# Patient Record
Sex: Female | Born: 1950 | Race: White | Hispanic: No | Marital: Married | Smoking: Former smoker
Health system: Southern US, Community
[De-identification: ages and names within clinical notes are randomized; demographics above are authoritative.]

## PROBLEM LIST (undated history)

## (undated) DIAGNOSIS — I499 Cardiac arrhythmia, unspecified: Secondary | ICD-10-CM

## (undated) DIAGNOSIS — J45909 Unspecified asthma, uncomplicated: Secondary | ICD-10-CM

## (undated) DIAGNOSIS — J189 Pneumonia, unspecified organism: Secondary | ICD-10-CM

## (undated) DIAGNOSIS — K219 Gastro-esophageal reflux disease without esophagitis: Secondary | ICD-10-CM

## (undated) DIAGNOSIS — E039 Hypothyroidism, unspecified: Secondary | ICD-10-CM

## (undated) DIAGNOSIS — IMO0002 Reserved for concepts with insufficient information to code with codable children: Secondary | ICD-10-CM

## (undated) DIAGNOSIS — M1990 Unspecified osteoarthritis, unspecified site: Secondary | ICD-10-CM

## (undated) DIAGNOSIS — I10 Essential (primary) hypertension: Secondary | ICD-10-CM

## (undated) HISTORY — PX: APPENDECTOMY: SHX54

## (undated) HISTORY — PX: TONSILLECTOMY: SUR1361

---

## 2004-11-24 ENCOUNTER — Ambulatory Visit (HOSPITAL_COMMUNITY): Admission: RE | Admit: 2004-11-24 | Discharge: 2004-11-24 | Payer: Self-pay | Admitting: Family Medicine

## 2005-01-24 ENCOUNTER — Ambulatory Visit (HOSPITAL_COMMUNITY): Admission: RE | Admit: 2005-01-24 | Discharge: 2005-01-24 | Payer: Self-pay | Admitting: Internal Medicine

## 2005-01-24 ENCOUNTER — Ambulatory Visit: Payer: Self-pay | Admitting: Internal Medicine

## 2006-10-26 ENCOUNTER — Ambulatory Visit (HOSPITAL_COMMUNITY): Admission: RE | Admit: 2006-10-26 | Discharge: 2006-10-26 | Payer: Self-pay | Admitting: Family Medicine

## 2006-10-29 ENCOUNTER — Ambulatory Visit (HOSPITAL_COMMUNITY): Admission: RE | Admit: 2006-10-29 | Discharge: 2006-10-29 | Payer: Self-pay | Admitting: Family Medicine

## 2008-03-25 ENCOUNTER — Ambulatory Visit (HOSPITAL_COMMUNITY): Admission: RE | Admit: 2008-03-25 | Discharge: 2008-03-25 | Payer: Self-pay | Admitting: Family Medicine

## 2010-08-09 ENCOUNTER — Ambulatory Visit (HOSPITAL_COMMUNITY)
Admission: RE | Admit: 2010-08-09 | Discharge: 2010-08-09 | Payer: Self-pay | Source: Home / Self Care | Admitting: Family Medicine

## 2010-09-21 ENCOUNTER — Ambulatory Visit (HOSPITAL_COMMUNITY): Admission: RE | Admit: 2010-09-21 | Payer: Self-pay | Admitting: Internal Medicine

## 2010-11-13 ENCOUNTER — Encounter: Payer: Self-pay | Admitting: Family Medicine

## 2010-11-14 ENCOUNTER — Encounter: Payer: Self-pay | Admitting: Family Medicine

## 2010-11-23 ENCOUNTER — Ambulatory Visit: Admit: 2010-11-23 | Payer: Self-pay | Admitting: Internal Medicine

## 2010-11-23 ENCOUNTER — Encounter (INDEPENDENT_AMBULATORY_CARE_PROVIDER_SITE_OTHER): Payer: Self-pay | Admitting: Internal Medicine

## 2010-11-23 ENCOUNTER — Ambulatory Visit (HOSPITAL_COMMUNITY): Admission: RE | Admit: 2010-11-23 | Payer: Self-pay | Source: Home / Self Care | Admitting: Internal Medicine

## 2011-03-10 NOTE — Op Note (Signed)
[name], [name]                 ACCOUNT NO.:  [id number]   MEDICAL RECORD NO.:  [id number]          PATIENT TYPE:  AMB   LOCATION:  DAY                           FACILITY:  [facility]   PHYSICIAN:  Lionel December, M.D.    DATE OF BIRTH:  August 15, 1951   DATE OF PROCEDURE:  01/24/2005  DATE OF DISCHARGE:                                 OPERATIVE REPORT   PROCEDURE:  Colonoscopy.   INDICATIONS:  Ashlee Solomon is a 61 year old Caucasian female who is here for  screening colonoscopy. Family history is positive for colon carcinoma and  father who died at age 50. Procedure risks were reviewed with the patient,  and informed consent was obtained.   PREMEDICATION:  Demerol 25 mg IV, Versed 10 mg IV in divided dose.   FINDINGS:  Procedure performed in endoscopy suite. The patient's vital signs  and O2 saturation were monitored during procedure and remained stable. The  patient was placed in left lateral position and rectal examination  performed. No abnormality noted on external or digital exam. Olympus  videoscope was placed in the rectum and advanced under vision into sigmoid  colon and beyond. Preparation was satisfactory. Somewhat redundant colon,  but scope was passed cecum which was identified by appendiceal stump/orifice  and ileocecal valve. Pictures taken for the record. She had some stool in  this area which had to be washed away. As the scope was withdrawn, colonic  mucosa was carefully examined was normal throughout. Rectal mucosa similarly  was normal. Scope was retroflexed to examine anorectal junction which was  unremarkable. Endoscope was straightened and withdrawn. The patient  tolerated the procedure well.   FINAL DIAGNOSIS:  Normal examination.   RECOMMENDATIONS:  She should continue yearly Hemoccults and consider next  screening exam in 5 years from now.     NR/MEDQ  D:  01/24/2005  T:  01/24/2005  Job:  [id number]   cc:   Donna Bernard, M.D.  [address]  [place]  [place] [address]  Fax: [phone]

## 2011-10-24 DIAGNOSIS — J189 Pneumonia, unspecified organism: Secondary | ICD-10-CM

## 2011-10-24 HISTORY — DX: Pneumonia, unspecified organism: J18.9

## 2012-02-23 ENCOUNTER — Other Ambulatory Visit: Payer: Self-pay | Admitting: Orthopedic Surgery

## 2012-02-23 MED ORDER — BUPIVACAINE 0.25 % ON-Q PUMP SINGLE CATH 300ML: 300.0000 mL | INJECTION | Status: DC

## 2012-02-23 MED ORDER — DEXAMETHASONE SODIUM PHOSPHATE 10 MG/ML IJ SOLN: 10.0000 mg | Freq: Once | INTRAMUSCULAR | Status: DC

## 2012-03-08 ENCOUNTER — Other Ambulatory Visit: Payer: Self-pay | Admitting: Family Medicine

## 2012-03-08 DIAGNOSIS — Z139 Encounter for screening, unspecified: Secondary | ICD-10-CM

## 2012-03-19 ENCOUNTER — Ambulatory Visit (HOSPITAL_COMMUNITY)
Admission: RE | Admit: 2012-03-19 | Discharge: 2012-03-19 | Disposition: A | Discharge status: Home / Self Care | Payer: BC Managed Care – PPO | Source: Ambulatory Visit | Attending: Family Medicine | Admitting: Family Medicine

## 2012-03-19 DIAGNOSIS — Z1231 Encounter for screening mammogram for malignant neoplasm of breast: Secondary | ICD-10-CM | POA: Insufficient documentation

## 2012-03-19 DIAGNOSIS — Z139 Encounter for screening, unspecified: Secondary | ICD-10-CM

## 2012-05-22 ENCOUNTER — Encounter (HOSPITAL_COMMUNITY): Payer: Self-pay | Admitting: Pharmacy Technician

## 2012-05-24 ENCOUNTER — Encounter (HOSPITAL_COMMUNITY)
Admission: RE | Admit: 2012-05-24 | Discharge: 2012-05-24 | Disposition: A | Discharge status: Home / Self Care | Payer: BC Managed Care – PPO | Source: Ambulatory Visit | Attending: Orthopedic Surgery | Admitting: Orthopedic Surgery

## 2012-05-24 ENCOUNTER — Ambulatory Visit (HOSPITAL_COMMUNITY)
Admission: RE | Admit: 2012-05-24 | Discharge: 2012-05-24 | Disposition: A | Discharge status: Home / Self Care | Payer: BC Managed Care – PPO | Source: Ambulatory Visit | Attending: Orthopedic Surgery | Admitting: Orthopedic Surgery

## 2012-05-24 ENCOUNTER — Encounter (HOSPITAL_COMMUNITY): Payer: Self-pay

## 2012-05-24 DIAGNOSIS — Z01818 Encounter for other preprocedural examination: Secondary | ICD-10-CM | POA: Insufficient documentation

## 2012-05-24 DIAGNOSIS — Z01812 Encounter for preprocedural laboratory examination: Secondary | ICD-10-CM | POA: Insufficient documentation

## 2012-05-24 DIAGNOSIS — Z01810 Encounter for preprocedural cardiovascular examination: Secondary | ICD-10-CM | POA: Insufficient documentation

## 2012-05-24 DIAGNOSIS — J9819 Other pulmonary collapse: Secondary | ICD-10-CM | POA: Insufficient documentation

## 2012-05-24 HISTORY — DX: Pneumonia, unspecified organism: J18.9

## 2012-05-24 HISTORY — DX: Unspecified osteoarthritis, unspecified site: M19.90

## 2012-05-24 HISTORY — DX: Unspecified asthma, uncomplicated: J45.909

## 2012-05-24 HISTORY — DX: Reserved for concepts with insufficient information to code with codable children: IMO0002

## 2012-05-24 HISTORY — DX: Essential (primary) hypertension: I10

## 2012-05-24 HISTORY — DX: Gastro-esophageal reflux disease without esophagitis: K21.9

## 2012-05-24 HISTORY — DX: Hypothyroidism, unspecified: E03.9

## 2012-05-24 LAB — URINALYSIS, ROUTINE W REFLEX MICROSCOPIC
Glucose, UA: NEGATIVE mg/dL
Hgb urine dipstick: NEGATIVE
Ketones, ur: NEGATIVE mg/dL
Leukocytes, UA: NEGATIVE
Nitrite: NEGATIVE
Protein, ur: NEGATIVE mg/dL
Specific Gravity, Urine: 1.012 (ref 1.005–1.030)
pH: 6.5 (ref 5.0–8.0)

## 2012-05-24 LAB — CBC
HCT: 37.4 % (ref 36.0–46.0)
MCV: 83.5 fL (ref 78.0–100.0)
Platelets: 268 10*3/uL (ref 150–400)
RBC: 4.48 MIL/uL (ref 3.87–5.11)
WBC: 9.8 10*3/uL (ref 4.0–10.5)

## 2012-05-24 LAB — COMPREHENSIVE METABOLIC PANEL
AST: 20 U/L (ref 0–37)
Alkaline Phosphatase: 74 U/L (ref 39–117)
CO2: 29 mEq/L (ref 19–32)
Chloride: 100 mEq/L (ref 96–112)
Creatinine, Ser: 0.76 mg/dL (ref 0.50–1.10)
GFR calc non Af Amer: 89 mL/min — ABNORMAL LOW (ref >90)
Potassium: 3.6 mEq/L (ref 3.5–5.1)
Total Bilirubin: 0.3 mg/dL (ref 0.3–1.2)

## 2012-05-24 LAB — SURGICAL PCR SCREEN: Staphylococcus aureus: NEGATIVE

## 2012-05-24 LAB — PROTIME-INR: Prothrombin Time: 13.2 seconds (ref 11.6–15.2)

## 2012-05-24 NOTE — Patient Instructions (Signed)
20 CLARINE ELROD  05/24/2012   Your procedure is scheduled on:  06/03/12  Monday  Surgery [id number]  Report to [facility] at   [phone]    AM.  Call this number if you have problems the morning of surgery: [phone]     Or PST   [id number]  [facility]   Remember:   Do not eat food  Or drink :After Midnight. Sunday NIGHT    Take these medicines the morning of surgery with A SIP OF WATER: METOPROLOL, CLARITIN, LEVOTHYROXINE                                                  MAY USE ALBUTEROL IF NEEDED         BRING INHALER WITH YOU TO HOSPITAL   Do not wear jewelry, make-up or nail polish.  Do not wear lotions, powders, or perfumes. You may wear deodorant.  Do not shave 48 hours prior to surgery.  Do not bring valuables to the hospital.  Contacts, dentures or bridgework may not be worn into surgery.  Leave suitcase in the car. After surgery it may be brought to your room.  For patients admitted to the hospital, checkout time is 11:00 AM the day of discharge.   Patients discharged the day of surgery will not be allowed to drive home.  Name and phone number of your driver:    husband                                                                  Special Instructions: CHG Shower Use Special Wash: 1/2 bottle night before surgery and 1/2 bottle morning of surgery. REGULAR SOAP FACE AND PRIVATES              LADIES- NO SHAVING 48 HOURS BEFORE USING BETASEPT SOAP.                  Please read over the following fact sheets that you were given: MRSA Information

## 2012-05-24 NOTE — Pre-Procedure Instructions (Signed)
Faxed request to Dr Lequita Halt with confirmation for PRE OP ORDERS pre PST appt

## 2012-05-27 NOTE — Pre-Procedure Instructions (Signed)
Second request for orders requested with confirmation Dr Lequita Halt

## 2012-05-29 ENCOUNTER — Other Ambulatory Visit: Payer: Self-pay | Admitting: Orthopedic Surgery

## 2012-05-29 MED ORDER — BUPIVACAINE 0.25 % ON-Q PUMP SINGLE CATH 300ML: 300.0000 mL | INJECTION | Status: DC

## 2012-05-29 MED ORDER — DEXAMETHASONE SODIUM PHOSPHATE 10 MG/ML IJ SOLN: 10.0000 mg | Freq: Once | INTRAMUSCULAR | Status: DC

## 2012-05-29 NOTE — Progress Notes (Signed)
Preoperative surgical orders have been place into the Epic hospital system for Ashlee Solomon on 05/29/2012, 4:03 PM  by [name] for surgery on 06/03/12.  Preop Total Knee orders including Bupivacaine On-Q pump, IV Tylenol, and IV Decadron as long as there are no contraindications to the above medications. Avel Peace, PA-C

## 2012-06-02 ENCOUNTER — Other Ambulatory Visit: Payer: Self-pay | Admitting: Orthopedic Surgery

## 2012-06-02 NOTE — H&P (Signed)
Ashlee Solomon Heads  DOB: 12-31-1950 Married / Language: English / Race: White / Female  Date of Admission: 06/03/2012  Chief complaint:  Left Knee Pain  History of Present Illness The patient is a 61 year old female who comes in for a preoperative History and Physical. The patient is scheduled for a total knee arthroplasty to be performed by Dr. Gus Rankin. Aluisio, MD at [facility] on 06/03/2012. The patient is a 61 year old female who presents for a recheck of Follow-up Knee. The patient is being followed for their bilateral knee pain and osteoarthritis. Symptoms reported today include: pain, swelling and pain with weightbearing. The patient feels that they are doing poorly. Current treatment includes: NSAIDs (Celebrex). The following medication has been used for pain control: none. The patient has reported improvement of their symptoms with: Cortisone injections (last injections only helped for about a month). She states that the knees are getting progressively worse. The Cortisone is no longer beneficial. She said that the left knee is causing more problems and making it more difficult for her to get around. She had not had a viscosupplement response in the past. She is at a stage now where she feels like she needs a more permanent solution to the knee pain. The right knee hurts also but less than the left. It does hurt at night. It is limiting what she can and cannot do. She is ready to proceed with surgery for the left knee. They have been treated conservatively in the past for the above stated problem and despite conservative measures, they continue to have progressive pain and severe functional limitations and dysfunction. They have failed non-operative management including home exercise, medications, and injections. It is felt that they would benefit from undergoing total joint replacement. Risks and benefits of the procedure have been discussed with the patient and they elect  to proceed with surgery. There are no active contraindications to surgery such as ongoing infection or rapidly progressive neurological disease.   Problem List/Past Medical Osteoarthritis, Knee (715.96) Asthma High blood pressure Hypothyroidism Degenerative Disc Disease  Allergies No Known Drug Allergies   Family History Bleeding disorder. mother Cancer. mother, father, sister, brother and grandmother mothers side Cerebrovascular Accident. grandfather fathers side Drug / Alcohol Addiction. grandfather mothers side Osteoarthritis. sister, brother and grandmother fathers side Osteoporosis. sister Heart Disease. father Rheumatoid Arthritis. father   Social History Alcohol use. current drinker; drinks beer, wine and hard liquor; less than 5 per week Previously in rehab. no Pain Contract. no Number of flights of stairs before winded. 1 Tobacco / smoke exposure. no Tobacco use. 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 3 Exercise. Exercises rarely Marital status. married Living situation. live with spouse Illicit drug use. no   Medication History Synthroid ( Tablet, Oral) Active. Metoprolol Tartrate (50MG  Tablet, Oral) Active. CeleBREX (200MG  Capsule, 1 (one) Oral daily, Taken starting 02/08/2012) Active. Hydrochlorothiazide (25MG  Tablet, Oral) Active. Aspirin (81MG  Tablet, Oral) Active.   Past Surgical History Appendectomy Cesarean Delivery. 3 or more times Tonsillectomy   Review of Systems General:Not Present- Chills, Fever, Night Sweats, Fatigue, Weight Gain, Weight Loss and Memory Loss. Skin:Not Present- Hives, Itching, Rash, Eczema and Lesions. HEENT:Not Present- Tinnitus, Headache, Double Vision, Visual Loss, Hearing Loss and Dentures. Respiratory:Not Present- Shortness of breath with  exertion, Shortness of breath at rest, Allergies, Coughing up blood and Chronic Cough. Cardiovascular:Not Present- Chest Pain,  Racing/skipping heartbeats, Difficulty Breathing Lying Down, Murmur, Swelling and Palpitations. Gastrointestinal:Not Present- Bloody Stool, Heartburn, Abdominal Pain, Vomiting, Nausea, Constipation, Diarrhea, Difficulty Swallowing, Jaundice and Loss of appetitie. Female Genitourinary:Not Present- Blood in Urine, Urinary frequency, Weak urinary stream, Discharge, Flank Pain, Incontinence, Painful Urination, Urgency, Urinary Retention and Urinating at Night. Musculoskeletal:Present- Joint Swelling, Joint Pain, Back Pain and Morning Stiffness. Not Present- Muscle Weakness, Muscle Pain and Spasms. Neurological:Not Present- Tremor, Dizziness, Blackout spells, Paralysis, Difficulty with balance and Weakness. Psychiatric:Not Present- Insomnia.   Vitals Weight: 290 lb Height: 64 in Body Surface Area: 2.44 m Body Mass Index: 49.78 kg/m Pulse: 68 (Regular) Resp.: 12 (Unlabored) BP: 128/82 (Sitting, Left Arm, Standard)   Physical Exam The physical exam findings are as follows: Patient is a 62 year old female with continued knee pain.   General Mental Status - Alert, cooperative and good historian. General Appearance- pleasant. Not in acute distress. Orientation- Oriented X3. Build & Nutrition- Well nourished and Well developed.   Head and Neck Head- normocephalic, atraumatic . Neck Global Assessment- supple. no bruit auscultated on the right and no bruit auscultated on the left.   Eye Pupil- Bilateral- Regular and Round. Motion- Bilateral- EOMI. wears glasses  Chest and Lung Exam Auscultation: Breath sounds:- clear at anterior chest wall and - clear at posterior chest wall. Adventitious sounds:- No Adventitious sounds.   Cardiovascular Auscultation:Rhythm- Regular rate and rhythm. Heart Sounds- S1 WNL and S2  WNL. Murmurs & Other Heart Sounds:Auscultation of the heart reveals - No Murmurs.   Abdomen Inspection:Contour- Generalized moderate distention. Palpation/Percussion:Tenderness- Abdomen is non-tender to palpation. Rigidity (guarding)- Abdomen is soft. Auscultation:Auscultation of the abdomen reveals - Bowel sounds normal.   Female Genitourinary Not done, not pertinent to present illness  Musculoskeletal On exam, well developed female alert and oriented in no apparent distress. Her hips show normal range of motion with no discomfort. Left knee no effusion. Range is 5-115. Marked crepitus on range of motion. She is tender medial greater than lateral. There is no instability noted. Right knee about 5-130. Slight crepitus on range of motion. Tender medial greater than lateral with no instability. Pulse, sensation and motor intact both lower extremities.  RADIOGRAPHS: Radiographs reviewed. Once again shows bone on bone arthritis in the medial and patellofemoral compartments both knees with osteophyte formation.  Assessment & Plan Osteoarthritis, Knee (715.96) Impression: Left Knee  Note: Patient is for a Left Total Knee Replacement by Dr. Lequita Halt.  Plan is to go home but will look into rehab if needed.  PCP - Dr. Lubertha South  Signed electronically by Roberts Gaudy, PA-C

## 2012-06-02 NOTE — Anesthesia Preprocedure Evaluation (Addendum)
Anesthesia Evaluation  Patient identified by MRN, date of birth, ID band Patient awake    Reviewed: Allergy & Precautions, H&P , NPO status , Patient's Chart, lab work & pertinent test results, reviewed documented beta blocker date and time   Airway Mallampati: II TM Distance: >3 FB Neck ROM: full    Dental No notable dental hx. (+) Teeth Intact and Dental Advisory Given   Pulmonary neg pulmonary ROS, asthma ,  breath sounds clear to auscultation  Pulmonary exam normal       Cardiovascular Exercise Tolerance: Good hypertension, Pt. on medications and Pt. on home beta blockers negative cardio ROS  Rhythm:regular Rate:Normal     Neuro/Psych negative neurological ROS  negative psych ROS   GI/Hepatic negative GI ROS, Neg liver ROS, GERD-  Medicated and Controlled,  Endo/Other  negative endocrine ROSHypothyroidism   Renal/GU negative Renal ROS  negative genitourinary   Musculoskeletal   Abdominal   Peds  Hematology negative hematology ROS (+)   Anesthesia Other Findings   Reproductive/Obstetrics negative OB ROS                           Anesthesia Physical Anesthesia Plan  ASA: II  Anesthesia Plan: Spinal   Post-op Pain Management:    Induction:   Airway Management Planned:   Additional Equipment:   Intra-op Plan:   Post-operative Plan:   Informed Consent: I have reviewed the patients History and Physical, chart, labs and discussed the procedure including the risks, benefits and alternatives for the proposed anesthesia with the patient or authorized representative who has indicated his/her understanding and acceptance.   Dental Advisory Given  Plan Discussed with: CRNA and Surgeon  Anesthesia Plan Comments:         Anesthesia Quick Evaluation

## 2012-06-03 ENCOUNTER — Ambulatory Visit (HOSPITAL_COMMUNITY): Payer: BC Managed Care – PPO | Admitting: Anesthesiology

## 2012-06-03 ENCOUNTER — Encounter (HOSPITAL_COMMUNITY)
Admission: RE | Disposition: A | Discharge status: Home Health | Payer: Self-pay | Source: Ambulatory Visit | Attending: Orthopedic Surgery

## 2012-06-03 ENCOUNTER — Encounter (HOSPITAL_COMMUNITY): Payer: Self-pay | Admitting: *Deleted

## 2012-06-03 ENCOUNTER — Inpatient Hospital Stay (HOSPITAL_COMMUNITY)
Admission: RE | Admit: 2012-06-03 | Discharge: 2012-06-06 | DRG: 209 | Disposition: A | Discharge status: Home Health | Payer: BC Managed Care – PPO | Source: Ambulatory Visit | Attending: Orthopedic Surgery | Admitting: Orthopedic Surgery

## 2012-06-03 ENCOUNTER — Encounter (HOSPITAL_COMMUNITY): Payer: Self-pay | Admitting: Anesthesiology

## 2012-06-03 DIAGNOSIS — IMO0002 Reserved for concepts with insufficient information to code with codable children: Secondary | ICD-10-CM | POA: Diagnosis present

## 2012-06-03 DIAGNOSIS — E871 Hypo-osmolality and hyponatremia: Secondary | ICD-10-CM

## 2012-06-03 DIAGNOSIS — J45909 Unspecified asthma, uncomplicated: Secondary | ICD-10-CM | POA: Diagnosis present

## 2012-06-03 DIAGNOSIS — D500 Iron deficiency anemia secondary to blood loss (chronic): Secondary | ICD-10-CM

## 2012-06-03 DIAGNOSIS — Z96659 Presence of unspecified artificial knee joint: Secondary | ICD-10-CM

## 2012-06-03 DIAGNOSIS — M1710 Unilateral primary osteoarthritis, unspecified knee: Principal | ICD-10-CM | POA: Diagnosis present

## 2012-06-03 DIAGNOSIS — D649 Anemia, unspecified: Secondary | ICD-10-CM | POA: Diagnosis not present

## 2012-06-03 DIAGNOSIS — M179 Osteoarthritis of knee, unspecified: Secondary | ICD-10-CM

## 2012-06-03 DIAGNOSIS — E039 Hypothyroidism, unspecified: Secondary | ICD-10-CM | POA: Diagnosis present

## 2012-06-03 DIAGNOSIS — K219 Gastro-esophageal reflux disease without esophagitis: Secondary | ICD-10-CM | POA: Diagnosis present

## 2012-06-03 DIAGNOSIS — I10 Essential (primary) hypertension: Secondary | ICD-10-CM | POA: Diagnosis present

## 2012-06-03 DIAGNOSIS — E876 Hypokalemia: Secondary | ICD-10-CM

## 2012-06-03 HISTORY — PX: TOTAL KNEE ARTHROPLASTY: SHX125

## 2012-06-03 LAB — TYPE AND SCREEN: ABO/RH(D): O NEG

## 2012-06-03 LAB — ABO/RH: ABO/RH(D): O NEG

## 2012-06-03 SURGERY — ARTHROPLASTY, KNEE, TOTAL
Anesthesia: Spinal | Site: Knee | Laterality: Left | Wound class: Clean

## 2012-06-03 MED ORDER — METOCLOPRAMIDE HCL 10 MG PO TABS
5.0000 mg | ORAL_TABLET | Freq: Three times a day (TID) | ORAL | Status: DC | PRN
Start: 2012-06-03 — End: 2012-06-06

## 2012-06-03 MED ORDER — STERILE WATER FOR IRRIGATION IR SOLN
Status: DC | PRN
  Administered 2012-06-03: 1500 mL

## 2012-06-03 MED ORDER — LACTATED RINGERS IV SOLN
INTRAVENOUS | Status: DC | PRN
  Administered 2012-06-03 (×3): via INTRAVENOUS

## 2012-06-03 MED ORDER — ACETAMINOPHEN 650 MG RE SUPP: 650.0000 mg | Freq: Four times a day (QID) | RECTAL | Status: DC | PRN

## 2012-06-03 MED ORDER — POLYETHYLENE GLYCOL 3350 17 G PO PACK: 17.0000 g | PACK | Freq: Every day | ORAL | Status: DC | PRN

## 2012-06-03 MED ORDER — MORPHINE SULFATE (PF) 1 MG/ML IV SOLN
INTRAVENOUS | Status: DC
  Administered 2012-06-03: 5 mg via INTRAVENOUS
  Administered 2012-06-03: 2 mg via INTRAVENOUS
  Administered 2012-06-03: 11:00:00 via INTRAVENOUS
  Administered 2012-06-03: 3 mg via INTRAVENOUS
  Administered 2012-06-04: 10 mg via INTRAVENOUS
  Administered 2012-06-04: 2 mg via INTRAVENOUS
  Administered 2012-06-04: 09:00:00 via INTRAVENOUS
  Filled 2012-06-03: qty 25

## 2012-06-03 MED ORDER — TRAMADOL HCL 50 MG PO TABS: 50.0000 mg | ORAL_TABLET | Freq: Four times a day (QID) | ORAL | Status: DC | PRN

## 2012-06-03 MED ORDER — KCL IN DEXTROSE-NACL 20-5-0.9 MEQ/L-%-% IV SOLN
INTRAVENOUS | Status: DC
  Administered 2012-06-03 – 2012-06-04 (×2): via INTRAVENOUS
  Filled 2012-06-03 (×3): qty 1000

## 2012-06-03 MED ORDER — CEFAZOLIN SODIUM-DEXTROSE 2-3 GM-% IV SOLR
INTRAVENOUS | Status: AC
  Filled 2012-06-03: qty 50

## 2012-06-03 MED ORDER — DOCUSATE SODIUM 100 MG PO CAPS
100.0000 mg | ORAL_CAPSULE | Freq: Two times a day (BID) | ORAL | Status: DC
  Administered 2012-06-03 – 2012-06-06 (×7): 100 mg via ORAL

## 2012-06-03 MED ORDER — SODIUM CHLORIDE 0.9 % IR SOLN
Status: DC | PRN
  Administered 2012-06-03: 3000 mL

## 2012-06-03 MED ORDER — MIDAZOLAM HCL 5 MG/5ML IJ SOLN
INTRAMUSCULAR | Status: DC | PRN
  Administered 2012-06-03 (×3): 1 mg via INTRAVENOUS
  Administered 2012-06-03: 2 mg via INTRAVENOUS

## 2012-06-03 MED ORDER — ACETAMINOPHEN 325 MG PO TABS
650.0000 mg | ORAL_TABLET | Freq: Four times a day (QID) | ORAL | Status: DC | PRN
  Administered 2012-06-04: 650 mg via ORAL
  Filled 2012-06-03: qty 2

## 2012-06-03 MED ORDER — METOPROLOL SUCCINATE ER 50 MG PO TB24
50.0000 mg | ORAL_TABLET | Freq: Every day | ORAL | Status: DC
  Administered 2012-06-04 – 2012-06-06 (×3): 50 mg via ORAL
  Filled 2012-06-03 (×5): qty 1

## 2012-06-03 MED ORDER — FLEET ENEMA 7-19 GM/118ML RE ENEM: 1.0000 | ENEMA | Freq: Once | RECTAL | Status: AC | PRN

## 2012-06-03 MED ORDER — METHOCARBAMOL 100 MG/ML IJ SOLN
500.0000 mg | Freq: Four times a day (QID) | INTRAVENOUS | Status: DC | PRN
  Administered 2012-06-03 (×2): 500 mg via INTRAVENOUS
  Filled 2012-06-03 (×2): qty 5

## 2012-06-03 MED ORDER — CEFAZOLIN SODIUM 1-5 GM-% IV SOLN
1.0000 g | Freq: Four times a day (QID) | INTRAVENOUS | Status: AC
  Administered 2012-06-03 (×2): 1 g via INTRAVENOUS
  Filled 2012-06-03 (×2): qty 50

## 2012-06-03 MED ORDER — NALOXONE HCL 0.4 MG/ML IJ SOLN: 0.4000 mg | INTRAMUSCULAR | Status: DC | PRN

## 2012-06-03 MED ORDER — DIPHENHYDRAMINE HCL 12.5 MG/5ML PO ELIX
12.5000 mg | ORAL_SOLUTION | ORAL | Status: DC | PRN
Start: 2012-06-03 — End: 2012-06-06

## 2012-06-03 MED ORDER — BUPIVACAINE 0.25 % ON-Q PUMP SINGLE CATH 300ML
INJECTION | Status: AC
Start: 2012-06-03 — End: ?
  Filled 2012-06-03: qty 300

## 2012-06-03 MED ORDER — ACETAMINOPHEN 10 MG/ML IV SOLN
1000.0000 mg | Freq: Four times a day (QID) | INTRAVENOUS | Status: AC
  Administered 2012-06-03 – 2012-06-04 (×4): 1000 mg via INTRAVENOUS
  Filled 2012-06-03 (×6): qty 100

## 2012-06-03 MED ORDER — MORPHINE SULFATE (PF) 1 MG/ML IV SOLN
INTRAVENOUS | Status: AC
  Filled 2012-06-03: qty 25

## 2012-06-03 MED ORDER — BUPIVACAINE ON-Q PAIN PUMP (FOR ORDER SET NO CHG)
INJECTION | Status: DC
  Filled 2012-06-03: qty 1

## 2012-06-03 MED ORDER — FENTANYL CITRATE 0.05 MG/ML IJ SOLN
INTRAMUSCULAR | Status: DC | PRN
  Administered 2012-06-03: 100 ug via INTRAVENOUS

## 2012-06-03 MED ORDER — ACETAMINOPHEN 10 MG/ML IV SOLN
INTRAVENOUS | Status: AC
  Filled 2012-06-03: qty 100

## 2012-06-03 MED ORDER — RIVAROXABAN 10 MG PO TABS
10.0000 mg | ORAL_TABLET | Freq: Every day | ORAL | Status: DC
  Administered 2012-06-04 – 2012-06-06 (×3): 10 mg via ORAL
  Filled 2012-06-03 (×5): qty 1

## 2012-06-03 MED ORDER — CEFAZOLIN SODIUM 1-5 GM-% IV SOLN
INTRAVENOUS | Status: AC
  Filled 2012-06-03: qty 50

## 2012-06-03 MED ORDER — DIPHENHYDRAMINE HCL 50 MG/ML IJ SOLN: 12.5000 mg | Freq: Four times a day (QID) | INTRAMUSCULAR | Status: DC | PRN

## 2012-06-03 MED ORDER — PROPOFOL 10 MG/ML IV BOLUS
INTRAVENOUS | Status: DC | PRN
  Administered 2012-06-03: 20 mg via INTRAVENOUS

## 2012-06-03 MED ORDER — BUPIVACAINE HCL 0.75 % IJ SOLN
INTRAMUSCULAR | Status: DC | PRN
  Administered 2012-06-03: 15 mg via INTRATHECAL

## 2012-06-03 MED ORDER — SODIUM CHLORIDE 0.9 % IJ SOLN: 9.0000 mL | INTRAMUSCULAR | Status: DC | PRN

## 2012-06-03 MED ORDER — DEXAMETHASONE SODIUM PHOSPHATE 10 MG/ML IJ SOLN
INTRAMUSCULAR | Status: DC | PRN
  Administered 2012-06-03: 10 mg via INTRAVENOUS

## 2012-06-03 MED ORDER — DIPHENHYDRAMINE HCL 12.5 MG/5ML PO ELIX: 12.5000 mg | ORAL_SOLUTION | Freq: Four times a day (QID) | ORAL | Status: DC | PRN

## 2012-06-03 MED ORDER — LORATADINE 10 MG PO TABS
10.0000 mg | ORAL_TABLET | Freq: Every day | ORAL | Status: DC
  Administered 2012-06-04 – 2012-06-06 (×3): 10 mg via ORAL
  Filled 2012-06-03 (×4): qty 1

## 2012-06-03 MED ORDER — SODIUM CHLORIDE 0.9 % IV SOLN: INTRAVENOUS | Status: DC

## 2012-06-03 MED ORDER — LACTATED RINGERS IV SOLN: INTRAVENOUS | Status: DC

## 2012-06-03 MED ORDER — HYDROMORPHONE HCL PF 1 MG/ML IJ SOLN: 0.2500 mg | INTRAMUSCULAR | Status: DC | PRN

## 2012-06-03 MED ORDER — METOCLOPRAMIDE HCL 5 MG/ML IJ SOLN: 5.0000 mg | Freq: Three times a day (TID) | INTRAMUSCULAR | Status: DC | PRN

## 2012-06-03 MED ORDER — ACETAMINOPHEN 10 MG/ML IV SOLN
1000.0000 mg | Freq: Once | INTRAVENOUS | Status: AC
  Administered 2012-06-03: 1000 mg via INTRAVENOUS

## 2012-06-03 MED ORDER — PROPOFOL 10 MG/ML IV EMUL
INTRAVENOUS | Status: DC | PRN
  Administered 2012-06-03: 100 ug/kg/min via INTRAVENOUS

## 2012-06-03 MED ORDER — ONDANSETRON HCL 4 MG/2ML IJ SOLN: 4.0000 mg | Freq: Four times a day (QID) | INTRAMUSCULAR | Status: DC | PRN

## 2012-06-03 MED ORDER — LEVOTHYROXINE SODIUM 175 MCG PO TABS
175.0000 ug | ORAL_TABLET | Freq: Every day | ORAL | Status: DC
  Administered 2012-06-04 – 2012-06-06 (×3): 175 ug via ORAL
  Filled 2012-06-03 (×5): qty 1

## 2012-06-03 MED ORDER — HYDROCHLOROTHIAZIDE 25 MG PO TABS
25.0000 mg | ORAL_TABLET | Freq: Every day | ORAL | Status: DC
  Administered 2012-06-04 – 2012-06-06 (×3): 25 mg via ORAL
  Filled 2012-06-03 (×5): qty 1

## 2012-06-03 MED ORDER — ONDANSETRON HCL 4 MG PO TABS: 4.0000 mg | ORAL_TABLET | Freq: Four times a day (QID) | ORAL | Status: DC | PRN

## 2012-06-03 MED ORDER — OXYCODONE HCL 5 MG PO TABS
5.0000 mg | ORAL_TABLET | ORAL | Status: DC | PRN
  Administered 2012-06-03: 10 mg via ORAL
  Administered 2012-06-03: 5 mg via ORAL
  Administered 2012-06-04 – 2012-06-06 (×15): 10 mg via ORAL
  Filled 2012-06-03 (×8): qty 2
  Filled 2012-06-03: qty 1
  Filled 2012-06-03 (×8): qty 2

## 2012-06-03 MED ORDER — KETAMINE HCL 10 MG/ML IJ SOLN
INTRAMUSCULAR | Status: DC | PRN
  Administered 2012-06-03 (×4): 10 mg via INTRAVENOUS

## 2012-06-03 MED ORDER — METHOCARBAMOL 500 MG PO TABS
500.0000 mg | ORAL_TABLET | Freq: Four times a day (QID) | ORAL | Status: DC | PRN
  Administered 2012-06-04 – 2012-06-06 (×8): 500 mg via ORAL
  Filled 2012-06-03 (×8): qty 1

## 2012-06-03 MED ORDER — EPHEDRINE SULFATE 50 MG/ML IJ SOLN
INTRAMUSCULAR | Status: DC | PRN
  Administered 2012-06-03 (×2): 5 mg via INTRAVENOUS
  Administered 2012-06-03: 10 mg via INTRAVENOUS

## 2012-06-03 MED ORDER — BUPIVACAINE 0.25 % ON-Q PUMP SINGLE CATH 300ML
INJECTION | Status: DC | PRN
  Administered 2012-06-03: 300 mL

## 2012-06-03 MED ORDER — ALBUTEROL SULFATE HFA 108 (90 BASE) MCG/ACT IN AERS
2.0000 | INHALATION_SPRAY | Freq: Four times a day (QID) | RESPIRATORY_TRACT | Status: DC | PRN
  Filled 2012-06-03: qty 6.7

## 2012-06-03 MED ORDER — DEXTROSE 5 % IV SOLN
3.0000 g | INTRAVENOUS | Status: AC
  Administered 2012-06-03: 3 g via INTRAVENOUS
  Filled 2012-06-03: qty 3000

## 2012-06-03 MED ORDER — BISACODYL 10 MG RE SUPP: 10.0000 mg | Freq: Every day | RECTAL | Status: DC | PRN

## 2012-06-03 MED ORDER — MENTHOL 3 MG MT LOZG
1.0000 | LOZENGE | OROMUCOSAL | Status: DC | PRN
  Filled 2012-06-03: qty 9

## 2012-06-03 MED ORDER — 0.9 % SODIUM CHLORIDE (POUR BTL) OPTIME
TOPICAL | Status: DC | PRN
  Administered 2012-06-03: 1000 mL

## 2012-06-03 MED ORDER — PHENOL 1.4 % MT LIQD
1.0000 | OROMUCOSAL | Status: DC | PRN
  Filled 2012-06-03: qty 177

## 2012-06-03 SURGICAL SUPPLY — 54 items
BAG ZIPLOCK 12X15 (MISCELLANEOUS) ×2 IMPLANT
BANDAGE ELASTIC 6 VELCRO ST LF (GAUZE/BANDAGES/DRESSINGS) ×2 IMPLANT
BANDAGE ESMARK 6X9 LF (GAUZE/BANDAGES/DRESSINGS) ×1 IMPLANT
BLADE SAG 18X100X1.27 (BLADE) ×2 IMPLANT
BLADE SAW SGTL 11.0X1.19X90.0M (BLADE) ×2 IMPLANT
BNDG ESMARK 6X9 LF (GAUZE/BANDAGES/DRESSINGS) ×2
BOWL SMART MIX CTS (DISPOSABLE) ×2 IMPLANT
CATH KIT ON-Q SILVERSOAK 5IN (CATHETERS) ×2 IMPLANT
CEMENT HV SMART SET (Cement) ×6 IMPLANT
CLOTH BEACON ORANGE TIMEOUT ST (SAFETY) ×2 IMPLANT
CUFF TOURN SGL QUICK 34 (TOURNIQUET CUFF) ×1
CUFF TRNQT CYL 34X4X40X1 (TOURNIQUET CUFF) ×1 IMPLANT
DRAPE EXTREMITY T 121X128X90 (DRAPE) ×2 IMPLANT
DRAPE POUCH INSTRU U-SHP 10X18 (DRAPES) ×2 IMPLANT
DRAPE U-SHAPE 47X51 STRL (DRAPES) ×2 IMPLANT
DRSG ADAPTIC 3X8 NADH LF (GAUZE/BANDAGES/DRESSINGS) ×2 IMPLANT
DRSG EMULSION OIL 3X16 NADH (GAUZE/BANDAGES/DRESSINGS) ×2 IMPLANT
DRSG PAD ABDOMINAL 8X10 ST (GAUZE/BANDAGES/DRESSINGS) ×2 IMPLANT
DURAPREP 26ML APPLICATOR (WOUND CARE) ×2 IMPLANT
ELECT REM PT RETURN 9FT ADLT (ELECTROSURGICAL) ×2
ELECTRODE REM PT RTRN 9FT ADLT (ELECTROSURGICAL) ×1 IMPLANT
EVACUATOR 1/8 PVC DRAIN (DRAIN) ×2 IMPLANT
FACESHIELD LNG OPTICON STERILE (SAFETY) ×10 IMPLANT
GLOVE BIO SURGEON STRL SZ7.5 (GLOVE) ×2 IMPLANT
GLOVE BIO SURGEON STRL SZ8 (GLOVE) ×2 IMPLANT
GLOVE BIOGEL PI IND STRL 8 (GLOVE) ×2 IMPLANT
GLOVE BIOGEL PI INDICATOR 8 (GLOVE) ×2
GOWN STRL NON-REIN LRG LVL3 (GOWN DISPOSABLE) ×2 IMPLANT
GOWN STRL REIN XL XLG (GOWN DISPOSABLE) ×2 IMPLANT
HANDPIECE INTERPULSE COAX TIP (DISPOSABLE) ×1
IMMOBILIZER KNEE 20 (SOFTGOODS) ×2
IMMOBILIZER KNEE 20 THIGH 36 (SOFTGOODS) ×1 IMPLANT
KIT BASIN OR (CUSTOM PROCEDURE TRAY) ×2 IMPLANT
MANIFOLD NEPTUNE II (INSTRUMENTS) ×2 IMPLANT
NS IRRIG 1000ML POUR BTL (IV SOLUTION) ×2 IMPLANT
PACK TOTAL JOINT (CUSTOM PROCEDURE TRAY) ×2 IMPLANT
PAD ABD 7.5X8 STRL (GAUZE/BANDAGES/DRESSINGS) ×2 IMPLANT
PADDING CAST ABS 6INX4YD NS (CAST SUPPLIES) ×1
PADDING CAST ABS COTTON 6X4 NS (CAST SUPPLIES) ×1 IMPLANT
PADDING CAST COTTON 6X4 STRL (CAST SUPPLIES) ×6 IMPLANT
POSITIONER SURGICAL ARM (MISCELLANEOUS) ×2 IMPLANT
SET HNDPC FAN SPRY TIP SCT (DISPOSABLE) ×1 IMPLANT
SPONGE GAUZE 4X4 12PLY (GAUZE/BANDAGES/DRESSINGS) ×2 IMPLANT
STRIP CLOSURE SKIN 1/2X4 (GAUZE/BANDAGES/DRESSINGS) ×4 IMPLANT
SUCTION FRAZIER 12FR DISP (SUCTIONS) ×2 IMPLANT
SUT MNCRL AB 4-0 PS2 18 (SUTURE) ×2 IMPLANT
SUT PDS AB 1 CT1 27 (SUTURE) ×6 IMPLANT
SUT VIC AB 2-0 CT1 27 (SUTURE) ×3
SUT VIC AB 2-0 CT1 TAPERPNT 27 (SUTURE) ×3 IMPLANT
SUT VLOC 180 0 24IN GS25 (SUTURE) ×2 IMPLANT
TOWEL OR 17X26 10 PK STRL BLUE (TOWEL DISPOSABLE) ×4 IMPLANT
TRAY FOLEY CATH 14FRSI W/METER (CATHETERS) ×2 IMPLANT
WATER STERILE IRR 1500ML POUR (IV SOLUTION) ×2 IMPLANT
WRAP KNEE MAXI GEL POST OP (GAUZE/BANDAGES/DRESSINGS) ×2 IMPLANT

## 2012-06-03 NOTE — H&P (View-Only) (Signed)
Ashlee Solomon  DOB: 04/12/1951 Married / Language: English / Race: White / Female  Date of Admission: 06/03/2012  Chief complaint:  Left Knee Pain  History of Present Illness The patient is a 61 year old female who comes in for a preoperative History and Physical. The patient is scheduled for a total knee arthroplasty to be performed by Dr. Frank V. Aluisio, MD at [facility] on 06/03/2012. The patient is a 61 year old female who presents for a recheck of Follow-up Knee. The patient is being followed for their bilateral knee pain and osteoarthritis. Symptoms reported today include: pain, swelling and pain with weightbearing. The patient feels that they are doing poorly. Current treatment includes: NSAIDs (Celebrex). The following medication has been used for pain control: none. The patient has reported improvement of their symptoms with: Cortisone injections (last injections only helped for about a month). She states that the knees are getting progressively worse. The Cortisone is no longer beneficial. She said that the left knee is causing more problems and making it more difficult for her to get around. She had not had a viscosupplement response in the past. She is at a stage now where she feels like she needs a more permanent solution to the knee pain. The right knee hurts also but less than the left. It does hurt at night. It is limiting what she can and cannot do. She is ready to proceed with surgery for the left knee. They have been treated conservatively in the past for the above stated problem and despite conservative measures, they continue to have progressive pain and severe functional limitations and dysfunction. They have failed non-operative management including home exercise, medications, and injections. It is felt that they would benefit from undergoing total joint replacement. Risks and benefits of the procedure have been discussed with the patient and they elect  to proceed with surgery. There are no active contraindications to surgery such as ongoing infection or rapidly progressive neurological disease.   Problem List/Past Medical Osteoarthritis, Knee (715.96) Asthma High blood pressure Hypothyroidism Degenerative Disc Disease  Allergies No Known Drug Allergies   Family History Bleeding disorder. mother Cancer. mother, father, sister, brother and grandmother mothers side Cerebrovascular Accident. grandfather fathers side Drug / Alcohol Addiction. grandfather mothers side Osteoarthritis. sister, brother and grandmother fathers side Osteoporosis. sister Heart Disease. father Rheumatoid Arthritis. father   Social History Alcohol use. current drinker; drinks beer, wine and hard liquor; less than 5 per week Previously in rehab. no Pain Contract. no Number of flights of stairs before winded. 1 Tobacco / smoke exposure. no Tobacco use. 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 3 Exercise. Exercises rarely Marital status. married Living situation. live with spouse Illicit drug use. no   Medication History Synthroid (175MCG Tablet, Oral) Active. Metoprolol Tartrate (50MG Tablet, Oral) Active. CeleBREX (200MG Capsule, 1 (one) Oral daily, Taken starting 02/08/2012) Active. Hydrochlorothiazide (25MG Tablet, Oral) Active. Aspirin (81MG Tablet, Oral) Active.   Past Surgical History Appendectomy Cesarean Delivery. 3 or more times Tonsillectomy   Review of Systems General:Not Present- Chills, Fever, Night Sweats, Fatigue, Weight Gain, Weight Loss and Memory Loss. Skin:Not Present- Hives, Itching, Rash, Eczema and Lesions. HEENT:Not Present- Tinnitus, Headache, Double Vision, Visual Loss, Hearing Loss and Dentures. Respiratory:Not Present- Shortness of breath with  exertion, Shortness of breath at rest, Allergies, Coughing up blood and Chronic Cough. Cardiovascular:Not Present- Chest Pain,   Racing/skipping heartbeats, Difficulty Breathing Lying Down, Murmur, Swelling and Palpitations. Gastrointestinal:Not Present- Bloody Stool, Heartburn, Abdominal Pain, Vomiting, Nausea, Constipation, Diarrhea, Difficulty Swallowing, Jaundice and Loss of appetitie. Female Genitourinary:Not Present- Blood in Urine, Urinary frequency, Weak urinary stream, Discharge, Flank Pain, Incontinence, Painful Urination, Urgency, Urinary Retention and Urinating at Night. Musculoskeletal:Present- Joint Swelling, Joint Pain, Back Pain and Morning Stiffness. Not Present- Muscle Weakness, Muscle Pain and Spasms. Neurological:Not Present- Tremor, Dizziness, Blackout spells, Paralysis, Difficulty with balance and Weakness. Psychiatric:Not Present- Insomnia.   Vitals Weight: 290 lb Height: 64 in Body Surface Area: 2.44 m Body Mass Index: 49.78 kg/m Pulse: 68 (Regular) Resp.: 12 (Unlabored) BP: 128/82 (Sitting, Left Arm, Standard)   Physical Exam The physical exam findings are as follows: Patient is a 61 year old female with continued knee pain.   General Mental Status - Alert, cooperative and good historian. General Appearance- pleasant. Not in acute distress. Orientation- Oriented X3. Build & Nutrition- Well nourished and Well developed.   Head and Neck Head- normocephalic, atraumatic . Neck Global Assessment- supple. no bruit auscultated on the right and no bruit auscultated on the left.   Eye Pupil- Bilateral- Regular and Round. Motion- Bilateral- EOMI. wears glasses  Chest and Lung Exam Auscultation: Breath sounds:- clear at anterior chest wall and - clear at posterior chest wall. Adventitious sounds:- No Adventitious sounds.   Cardiovascular Auscultation:Rhythm- Regular rate and rhythm. Heart Sounds- S1 WNL and S2  WNL. Murmurs & Other Heart Sounds:Auscultation of the heart reveals - No Murmurs.   Abdomen Inspection:Contour- Generalized moderate distention. Palpation/Percussion:Tenderness- Abdomen is non-tender to palpation. Rigidity (guarding)- Abdomen is soft. Auscultation:Auscultation of the abdomen reveals - Bowel sounds normal.   Female Genitourinary Not done, not pertinent to present illness  Musculoskeletal On exam, well developed female alert and oriented in no apparent distress. Her hips show normal range of motion with no discomfort. Left knee no effusion. Range is 5-115. Marked crepitus on range of motion. She is tender medial greater than lateral. There is no instability noted. Right knee about 5-130. Slight crepitus on range of motion. Tender medial greater than lateral with no instability. Pulse, sensation and motor intact both lower extremities.  RADIOGRAPHS: Radiographs reviewed. Once again shows bone on bone arthritis in the medial and patellofemoral compartments both knees with osteophyte formation.  Assessment & Plan Osteoarthritis, Knee (715.96) Impression: Left Knee  Note: Patient is for a Left Total Knee Replacement by Dr. Aluisio.  Plan is to go home but will look into rehab if needed.  PCP - Dr. Steve Luking  Signed electronically by DREW L PERKINS, PA-C  

## 2012-06-03 NOTE — Transfer of Care (Signed)
Immediate Anesthesia Transfer of Care Note  Patient: Ashlee Solomon  Procedure(s) Performed: Procedure(s) (LRB): TOTAL KNEE ARTHROPLASTY (Left)  Patient Location: PACU  Anesthesia Type: Spinal  Level of Consciousness: awake, alert , oriented and patient cooperative  Airway & Oxygen Therapy: Patient Spontanous Breathing and Patient connected to face mask oxygen  Post-op Assessment: Report given to PACU RN, Post -op Vital signs reviewed and stable and SAB level T12.  Post vital signs: Reviewed and stable  Complications: No apparent anesthesia complications

## 2012-06-03 NOTE — Interval H&P Note (Signed)
History and Physical Interval Note:  06/03/2012 8:24 AM  Ashlee Solomon  has presented today for surgery, with the diagnosis of osteoarthritis left knee  The various methods of treatment have been discussed with the patient and family. After consideration of risks, benefits and other options for treatment, the patient has consented to  Procedure(s) (LRB): TOTAL KNEE ARTHROPLASTY (Left) as a surgical intervention .  The patient's history has been reviewed, patient examined, no change in status, stable for surgery.  I have reviewed the patient's chart and labs.  Questions were answered to the patient's satisfaction.     Loanne Drilling

## 2012-06-03 NOTE — Anesthesia Postprocedure Evaluation (Signed)
  Anesthesia Post-op Note  Patient: Ashlee Solomon  Procedure(s) Performed: Procedure(s) (LRB): TOTAL KNEE ARTHROPLASTY (Left)  Patient Location: PACU  Anesthesia Type: Spinal  Level of Consciousness: awake and alert   Airway and Oxygen Therapy: Patient Spontanous Breathing  Post-op Pain: mild  Post-op Assessment: Post-op Vital signs reviewed, Patient's Cardiovascular Status Stable, Respiratory Function Stable, Patent Airway and No signs of Nausea or vomiting  Post-op Vital Signs: stable  Complications: No apparent anesthesia complications

## 2012-06-03 NOTE — Progress Notes (Signed)
CARE MANAGEMENT NOTE 06/03/2012  Patient:  Ashlee Solomon, Ashlee Solomon   Account Number:  [id number]  Date Initiated:  06/03/2012  Documentation initiated by:  Colleen Can  Subjective/Objective Assessment:   dx osteoarthritis; total left knee replacemnt     Action/Plan:   CM will interview patient after pt/ot eval is done   Anticipated DC Date:  06/06/2012   Anticipated DC Plan:  HOME W HOME HEALTH SERVICES      DC Planning Services  CM consult          Status of service:  In process, will continue to follow  Per UR Regulation:  Reviewed for med. necessity/level of care/duration of stay

## 2012-06-03 NOTE — Preoperative (Signed)
Beta Blockers   Reason not to administer Beta Blockers:Took Metoprolol this am. 

## 2012-06-03 NOTE — Op Note (Signed)
Pre-operative diagnosis- Osteoarthritis  Left knee(s)  Post-operative diagnosis- Osteoarthritis Left knee(s)  Procedure-  Left  Total Knee Arthroplasty  Surgeon- Gus Rankin. Samy Ryner, MD  Assistant- Dimitri Ped, PA-C   Anesthesia-  Spinal EBL-* No blood loss amount entered *  Drains Hemovac  Tourniquet time-  Total Tourniquet Time Documented: Thigh (Left) - 47 minutes   Complications- None  Condition-PACU - hemodynamically stable.   Brief Clinical Note  Ashlee Solomon is a 61 y.o. year old female with end stage OA of her left knee with progressively worsening pain and dysfunction. She has constant pain, with activity and at rest and significant functional deficits with difficulties even with ADLs. She has had extensive non-op management including analgesics, injections of cortisone and viscosupplements, and home exercise program, but remains in significant pain with significant dysfunction. Radiographs show bone on bone arthritis medial and patellofemoral. She presents now for left Total Knee Arthroplasty.    Procedure in detail---   The patient is brought into the operating room and positioned supine on the operating table. After successful administration of  Spinal,   a tourniquet is placed high on the  Left thigh(s) and the lower extremity is prepped and draped in the usual sterile fashion. Time out is performed by the operating team and then the  Left lower extremity is wrapped in Esmarch, knee flexed and the tourniquet inflated to 300 mmHg.       A midline incision is made with a ten blade through the subcutaneous tissue to the level of the extensor mechanism. A fresh blade is used to make a medial parapatellar arthrotomy. Soft tissue over the proximal medial tibia is subperiosteally elevated to the joint line with a knife and into the semimembranosus bursa with a Cobb elevator. Soft tissue over the proximal lateral tibia is elevated with attention being paid to avoiding the patellar  tendon on the tibial tubercle. The patella is everted, knee flexed 90 degrees and the ACL and PCL are removed. Findings are bone on bone medial and patellofemoral with large medial osteophytes.        The drill is used to create a starting hole in the distal femur and the canal is thoroughly irrigated with sterile saline to remove the fatty contents. The 5 degree Left  valgus alignment guide is placed into the femoral canal and the distal femoral cutting block is pinned to remove 10 mm off the distal femur. Resection is made with an oscillating saw.      The tibia is subluxed forward and the menisci are removed. The extramedullary alignment guide is placed referencing proximally at the medial aspect of the tibial tubercle and distally along the second metatarsal axis and tibial crest. The block is pinned to remove 2mm off the more deficient medial  side. Resection is made with an oscillating saw. Size 3is the most appropriate size for the tibia and the proximal tibia is prepared with the modular drill and keel punch for that size.      The femoral sizing guide is placed and size 3 is most appropriate. Rotation is marked off the epicondylar axis and confirmed by creating a rectangular flexion gap at 90 degrees. The size 3 cutting block is pinned in this rotation and the anterior, posterior and chamfer cuts are made with the oscillating saw. The intercondylar block is then placed and that cut is made.      Trial size 3 tibial component, trial size 3 posterior stabilized femur and a 10  mm posterior stabilized rotating platform insert trial is placed. Full extension is achieved with excellent varus/valgus and anterior/posterior balance throughout full range of motion. The patella is everted and thickness measured to be 22  mm. Free hand resection is taken to 12 mm, a 35 template is placed, lug holes are drilled, trial patella is placed, and it tracks normally. Osteophytes are removed off the posterior femur with  the trial in place. All trials are removed and the cut bone surfaces prepared with pulsatile lavage. Cement is mixed and once ready for implantation, the size 3 tibial implant, size  3 posterior stabilized femoral component, and the size 35 patella are cemented in place and the patella is held with the clamp. The trial insert is placed and the knee held in full extension. All extruded cement is removed and once the cement is hard the permanent 10 mm posterior stabilized rotating platform insert is placed into the tibial tray.      The wound is copiously irrigated with saline solution and the extensor mechanism closed over a hemovac drain with #1 PDS suture. The tourniquet is released for a total tourniquet time of 47  minutes. Flexion against gravity is 135 degrees and the patella tracks normally. Subcutaneous tissue is closed with 2.0 vicryl and subcuticular with running 4.0 Monocryl. The catheter for the Marcaine pain pump is placed and the pump is initiated. The incision is cleaned and dried and steri-strips and a bulky sterile dressing are applied. The limb is placed into a knee immobilizer and the patient is awakened and transported to recovery in stable condition.      Please note that a surgical assistant was a medical necessity for this procedure in order to perform it in a safe and expeditious manner. Surgical assistant was necessary to retract the ligaments and vital neurovascular structures to prevent injury to them and also necessary for proper positioning of the limb to allow for anatomic placement of the prosthesis.   Gus Rankin Ashlee Miltner, MD    06/03/2012, 9:55 AM

## 2012-06-03 NOTE — Anesthesia Procedure Notes (Signed)
Spinal  Patient location during procedure: OR Start time: 06/03/2012 8:34 AM End time: 06/03/2012 8:39 AM Staffing Anesthesiologist: Ronelle Nigh L Performed by: anesthesiologist  Preanesthetic Checklist Completed: patient identified, site marked, surgical consent, pre-op evaluation, timeout performed, IV checked, risks and benefits discussed and monitors and equipment checked Spinal Block Patient position: sitting Prep: Betadine Patient monitoring: heart rate, continuous pulse ox and blood pressure Approach: midline Location: L3-4 Injection technique: single-shot Needle Needle type: Spinocan  Needle gauge: 22 G Needle length: 12.7 cm Assessment Sensory level: T6 Additional Notes Expiration date of kit checked and confirmed. Patient tolerated procedure well, without complications.

## 2012-06-04 ENCOUNTER — Encounter (HOSPITAL_COMMUNITY): Payer: Self-pay | Admitting: Orthopedic Surgery

## 2012-06-04 LAB — CBC
HCT: 29.3 % — ABNORMAL LOW (ref 36.0–46.0)
Platelets: 240 10*3/uL (ref 150–400)
RDW: 14 % (ref 11.5–15.5)
WBC: 11.5 10*3/uL — ABNORMAL HIGH (ref 4.0–10.5)

## 2012-06-04 LAB — BASIC METABOLIC PANEL
Chloride: 103 mEq/L (ref 96–112)
GFR calc Af Amer: >90 mL/min (ref >90)
Potassium: 4 mEq/L (ref 3.5–5.1)

## 2012-06-04 MED ORDER — MORPHINE SULFATE 2 MG/ML IJ SOLN
1.0000 mg | INTRAMUSCULAR | Status: DC | PRN
  Administered 2012-06-04: 2 mg via INTRAVENOUS
  Filled 2012-06-04: qty 1

## 2012-06-04 NOTE — Evaluation (Signed)
Physical Therapy Evaluation Patient Details Name: Ashlee Solomon MRN: [id number] DOB: [birth year] Today's Date: 06/04/2012 Time: 0925-1000 PT Time Calculation (min): 35 min  PT Assessment / Plan / Recommendation Clinical Impression  61 yo female s/p L TKA. Mobilizing fairly well. Anticipate pt wil progress well during stay.     PT Assessment  Patient needs continued PT services    Follow Up Recommendations  Home health PT    Barriers to Discharge        Equipment Recommendations  None recommended by PT    Recommendations for Other Services OT consult   Frequency 7X/week    Precautions / Restrictions Precautions Precautions: Knee Required Braces or Orthoses: Knee Immobilizer - Left Knee Immobilizer - Left: Discontinue once straight leg raise with < 10 degree lag Restrictions Weight Bearing Restrictions: No LLE Weight Bearing: Weight bearing as tolerated   Pertinent Vitals/Pain       Mobility  Bed Mobility Bed Mobility: Supine to Sit Supine to Sit: 3: Mod assist;HOB elevated;With rails Details for Bed Mobility Assistance: VCs safety, technique, hand placement. Assist for trunk to upright and L LE off bed. Increased time.  Transfers Transfers: Sit to Stand;Stand to Sit Sit to Stand: 3: Mod assist;With upper extremity assist;From bed;From elevated surface Stand to Sit: 3: Mod assist;With upper extremity assist;With armrests;To chair/3-in-1 Details for Transfer Assistance: VCs safety, technique, hand placement. Assist to rise, stabilize, control descent.  Ambulation/Gait Ambulation/Gait Assistance: 4: Min assist Ambulation Distance (Feet): 30 Feet Assistive device: Rolling walker Ambulation/Gait Assistance Details: VCs safety, technique, sequence. Slow gait speed. Fatigues somewhat easily. Followed with recliner Gait Pattern: Step-to pattern;Antalgic;Decreased stride length;Decreased step length - right;Decreased step length - left    Exercises     PT Diagnosis:  Difficulty walking;Abnormality of gait;Acute pain  PT Problem List: Decreased strength;Decreased range of motion;Decreased activity tolerance;Decreased mobility;Pain;Decreased knowledge of use of DME PT Treatment Interventions: DME instruction;Gait training;Functional mobility training;Therapeutic activities;Therapeutic exercise;Patient/family education;Stair training   PT Goals Acute Rehab PT Goals PT Goal Formulation: With patient Time For Goal Achievement: 06/11/12 Potential to Achieve Goals: Good Pt will go Supine/Side to Sit: with supervision PT Goal: Supine/Side to Sit - Progress: Goal set today Pt will go Sit to Supine/Side: with supervision PT Goal: Sit to Supine/Side - Progress: Goal set today Pt will go Sit to Stand: with supervision PT Goal: Sit to Stand - Progress: Goal set today Pt will Ambulate: 51 - 150 feet;with supervision;with least restrictive assistive device PT Goal: Ambulate - Progress: Goal set today Pt will Go Up / Down Stairs: 1-2 stairs;with least restrictive assistive device;with min assist (1+1 ) PT Goal: Up/Down Stairs - Progress: Goal set today  Visit Information  Last PT Received On: 06/04/12 Assistance Needed: +1    Subjective Data  Subjective: "I'm a rehab nurse" Patient Stated Goal: Home   Prior Functioning  Home Living Lives With: Spouse Available Help at Discharge: Family Type of Home: House Home Access: Stairs to enter Secretary/administrator of Steps: 1+1 Entrance Stairs-Rails: None Home Layout: Two level;Able to live on main level with bedroom/bathroom Bathroom Shower/Tub: Tub/shower unit Home Adaptive Equipment: Tub transfer bench;Walker - standard Prior Function Level of Independence: Independent Able to Take Stairs?: Yes Driving: Yes Vocation: Full time employment Communication Communication: No difficulties    Cognition  Overall Cognitive Status: Appears within functional limits for tasks assessed/performed Arousal/Alertness:  Awake/alert Orientation Level: Appears intact for tasks assessed Behavior During Session: [facility] for tasks performed    Extremity/Trunk Assessment Right Lower Extremity  Assessment RLE ROM/Strength/Tone: [facility] for tasks assessed Left Lower Extremity Assessment LLE ROM/Strength/Tone: Deficits LLE ROM/Strength/Tone Deficits: SLR 2/5. moves ankle well.  LLE Sensation: WFL - Light Touch Trunk Assessment Trunk Assessment: Normal   Balance    End of Session PT - End of Session Equipment Utilized During Treatment: Gait belt;Left knee immobilizer Activity Tolerance: Patient tolerated treatment well Patient left: in chair;with call bell/phone within reach Nurse Communication: Mobility status CPM Left Knee CPM Left Knee: Off  GP     Rebeca Alert [facility] 06/04/2012, 11:35 AM [phone]

## 2012-06-04 NOTE — Progress Notes (Signed)
06/04/2012 Ashlee Solomon BSN CCM [phone] CM spoke wth patient regarding discharge plans. Pt lives in [place], [place], [place]. Spouse will be caregiver.1st plan is for home with home health services of [facility]-[phone], fax-[phone].  Second plan is for SNF at [facility] if she is unable to go home. States she will make decision tomporrow based on her condition. Csw notified. CM will follow.

## 2012-06-04 NOTE — Progress Notes (Signed)
   Subjective: 1 Day Post-Op Procedure(s) (LRB): TOTAL KNEE ARTHROPLASTY (Left) Patient reports pain as mild and moderate.  Tough night but better this morning. Patient seen in rounds with Dr. Lequita Halt. Patient is well, but has had some minor complaints of pain in the knee, requiring pain medications We will start therapy today.  Plan is to go Home after hospital stay.  Objective: Vital signs in last 24 hours: Temp:  [97.4 F (36.3 C)-98.8 F (37.1 C)] 97.9 F (36.6 C) (08/13 0617) Pulse Rate:  [63-91] 71  (08/13 0617) Resp:  [8-20] 12  (08/13 0800) BP: (98-118)/(60-87) 111/87 mmHg (08/13 0617) SpO2:  [96 %-100 %] 99 % (08/13 0800) Weight:  [131.543 kg (290 lb)] 131.543 kg (290 lb) (08/12 1318)  Intake/Output from previous day:  Intake/Output Summary (Last 24 hours) at 06/04/12 0857 Last data filed at 06/04/12 0710  Gross per 24 hour  Intake   5165 ml  Output   4385 ml  Net    780 ml    Intake/Output this shift: Total I/O In: 635 [I.V.:535; IV Piggyback:100] Out: 1050 [Urine:1000; Drains:50]  Labs:  [facility] 06/04/12 0418  HGB 9.8*    Basename 06/04/12 0418  WBC 11.5*  RBC 3.53*  HCT 29.3*  PLT 240    Basename 06/04/12 0418  NA 136  K 4.0  CL 103  CO2 25  BUN 11  CREATININE 0.70  GLUCOSE 145*  CALCIUM 8.4   No results found for this basename: LABPT:2,INR:2 in the last 72 hours  EXAM General - Patient is Alert, Appropriate and Oriented Extremity - Neurovascular intact Sensation intact distally Dorsiflexion/Plantar flexion intact Dressing - dressing C/D/I Motor Function - intact, moving foot and toes well on exam.  Hemovac pulled without difficulty.  Past Medical History  Diagnosis Date  . Hypertension     clearance Dr Gerda Diss with note on chart  . Hypothyroidism   . Reactive airway disease   . Pneumonia 1/13  . GERD (gastroesophageal reflux disease)   . Arthritis   . DDD (degenerative disc disease)     Assessment/Plan: 1 Day Post-Op  Procedure(s) (LRB): TOTAL KNEE ARTHROPLASTY (Left) Principal Problem:  *OA (osteoarthritis) of knee   Advance diet Up with therapy Discharge home with home health  DVT Prophylaxis - Xarelto, 81 ng ASA on hold for now. Weight-Bearing as tolerated to left leg No vaccines. D/C PCA Morphine, Change to IV push D/C O2 and Pulse OX and try on Room [address]  Ashlee Solomon 06/04/2012, 8:57 AM

## 2012-06-04 NOTE — Progress Notes (Signed)
Physical Therapy Treatment Patient Details Name: Ashlee Solomon MRN: [id number] DOB: 1951/04/19 Today's Date: 06/04/2012 Time: 4098-1191 PT Time Calculation (min): 41 min  PT Assessment / Plan / Recommendation Comments on Treatment Session  Progressing slowly. Limited by pain.     Follow Up Recommendations  Home health PT    Barriers to Discharge        Equipment Recommendations       Recommendations for Other Services OT consult  Frequency 7X/week   Plan Discharge plan remains appropriate    Precautions / Restrictions Precautions Precautions: Knee Required Braces or Orthoses: Knee Immobilizer - Left Knee Immobilizer - Left: Discontinue once straight leg raise with < 10 degree lag Restrictions Weight Bearing Restrictions: No LLE Weight Bearing: Weight bearing as tolerated   Pertinent Vitals/Pain 9/10 L LE    Mobility  Bed Mobility Bed Mobility: Sit to Supine Sit to Supine: 3: Mod assist Details for Bed Mobility Assistance: VCs safety, technique, hand placement. Assist for L LE onto bed and trunk to supine.  Transfers Transfers: Sit to Stand;Stand to Sit Sit to Stand: 3: Mod assist;With upper extremity assist;From chair/3-in-1;With armrests Stand to Sit: 4: Min assist;With upper extremity assist;To bed Details for Transfer Assistance: VCs safety, technique, hand placement. Assist to rise, stabilize, control descent.  Ambulation/Gait Ambulation/Gait Assistance: 4: Min assist Ambulation Distance (Feet): 50 Feet Assistive device: Rolling walker Ambulation/Gait Assistance Details: VCs safety, technique, sequence. Slow gait speed. Limited by pain.  Gait Pattern: Step-to pattern;Antalgic;Decreased stride length;Decreased step length - right;Decreased step length - left    Exercises Total Joint Exercises Ankle Circles/Pumps: AROM;Both;10 reps;Supine Quad Sets: AROM;Both;10 reps;Supine Short Arc Quad: AAROM;Left;10 reps;Supine;Strengthening Heel Slides:  AAROM;Left;Strengthening;10 reps;Supine Hip ABduction/ADduction: AAROM;Strengthening;Left;10 reps;Supine Straight Leg Raises: AAROM;Strengthening;Left;10 reps;Supine   PT Diagnosis:    PT Problem List:   PT Treatment Interventions:     PT Goals Acute Rehab PT Goals PT Goal Formulation: With patient Time For Goal Achievement: 06/11/12 Potential to Achieve Goals: Good Pt will go Supine/Side to Sit: with supervision PT Goal: Supine/Side to Sit - Progress: Goal set today Pt will go Sit to Supine/Side: with supervision PT Goal: Sit to Supine/Side - Progress: Progressing toward goal Pt will go Sit to Stand: with supervision PT Goal: Sit to Stand - Progress: Progressing toward goal Pt will Ambulate: 51 - 150 feet;with supervision;with least restrictive assistive device PT Goal: Ambulate - Progress: Progressing toward goal Pt will Go Up / Down Stairs: 1-2 stairs;with least restrictive assistive device;with min assist (1+1 ) PT Goal: Up/Down Stairs - Progress: Goal set today  Visit Information  Last PT Received On: 06/04/12 Assistance Needed: +1    Subjective Data  Subjective: "Oh, I can't do this" Patient Stated Goal: Home   Cognition  Overall Cognitive Status: Appears within functional limits for tasks assessed/performed Arousal/Alertness: Awake/alert Orientation Level: Appears intact for tasks assessed Behavior During Session: [facility] for tasks performed    Balance     End of Session PT - End of Session Equipment Utilized During Treatment: Gait belt;Left knee immobilizer Activity Tolerance: Patient limited by pain Patient left: in bed;with call bell/phone within reach   GP     Rebeca Alert [facility] 06/04/2012, 3:29 PM [phone]

## 2012-06-05 DIAGNOSIS — E871 Hypo-osmolality and hyponatremia: Secondary | ICD-10-CM | POA: Diagnosis not present

## 2012-06-05 DIAGNOSIS — D500 Iron deficiency anemia secondary to blood loss (chronic): Secondary | ICD-10-CM | POA: Diagnosis not present

## 2012-06-05 LAB — CBC
MCV: 84.9 fL (ref 78.0–100.0)
Platelets: 234 10*3/uL (ref 150–400)
RBC: 3.57 MIL/uL — ABNORMAL LOW (ref 3.87–5.11)
RDW: 14.4 % (ref 11.5–15.5)
WBC: 11.7 10*3/uL — ABNORMAL HIGH (ref 4.0–10.5)

## 2012-06-05 LAB — BASIC METABOLIC PANEL
CO2: 27 mEq/L (ref 19–32)
Calcium: 8.5 mg/dL (ref 8.4–10.5)
Chloride: 100 mEq/L (ref 96–112)
Creatinine, Ser: 0.78 mg/dL (ref 0.50–1.10)
GFR calc Af Amer: >90 mL/min (ref >90)
Sodium: 134 mEq/L — ABNORMAL LOW (ref 135–145)

## 2012-06-05 NOTE — Progress Notes (Signed)
Physical Therapy Treatment Patient Details Name: Ashlee Solomon MRN: [id number] DOB: [birth year] Today's Date: 06/05/2012 Time: 1420-1500 PT Time Calculation (min): 40 min  PT Assessment / Plan / Recommendation Comments on Treatment Session  Started with TKR TE's while supine in bed.  Spouse arrived.  Demonstrated and educated spouse on safe handling tech to assist his wife OOB and ambulate pt in hallway.  Pt and spouse given handout on HEP and educated on going up/down on step to enter the home as well as proper tech to get in/out of the car.  Pt declines the need for SNF and spouse agrees she would be "better off at home" and stated he is able to assist her.    Follow Up Recommendations  Home health PT    Barriers to Discharge        Equipment Recommendations  Rolling walker with 5" wheels;3 in 1 bedside comode (both bariatric)  Spouse thinks their walker is standard with out wheels   Recommendations for Other Services    Frequency 7X/week   Plan Discharge plan remains appropriate    Precautions / Restrictions Precautions Precautions: Knee Precaution Comments: Instructed Pt on KI use and proper application Required Braces or Orthoses: Knee Immobilizer - Left Knee Immobilizer - Left: Discontinue once straight leg raise with < 10 degree lag Restrictions Weight Bearing Restrictions: No LLE Weight Bearing: Weight bearing as tolerated   Pertinent Vitals/Pain 6/10 with amb ICE applied    Mobility  Bed Mobility Bed Mobility: Not assessed Details include instructing spouse on safe handling tech to assist pt OOB.  Transfers Transfers: Sit to Stand;Stand to Sit Sit to Stand: 3: Min assist;From bed Stand to Sit: 3: Min assist;To chair/3-in-1 Details for Transfer Assistance: 25% VC's on proper tech and hand placement and increased time  Ambulation/Gait Ambulation/Gait Assistance:  Min assist  Ambulation Distance (Feet): 65 Feet with spouse under instruction of therapist on sage  handling Assistive device: Rolling walker Ambulation/Gait Assistance Details: increased time and 25% VC's on safety with turns and backward gait Gait Pattern: Step-to pattern;Shuffle Gait velocity: decreased    Exercises Total Joint Exercises Ankle Circles/Pumps: AROM;Both;10 reps;Supine Quad Sets: AROM;Both;10 reps;Supine Gluteal Sets: AROM;Both;10 reps;Supine Towel Squeeze: AROM;Both;10 reps;Supine Heel Slides: AAROM;Left;10 reps;Supine Hip ABduction/ADduction: AAROM;Left;10 reps;Supine Straight Leg Raises: AAROM;Left;10 reps;Supine    PT Goals               progressing    Visit Information  Last PT Received On: 06/05/12 Assistance Needed: +1    Subjective Data   "I want to go home"   Cognition    good   Balance   fair with RW Advised spouse to be with pt every time she gets up until she is more steady  End of Session PT - End of Session Equipment Utilized During Treatment: Gait belt Activity Tolerance: Patient tolerated treatment well Patient left: in chair   Felecia Shelling  PTA [facility]  Acute  Rehab Pager     [phone]

## 2012-06-05 NOTE — Progress Notes (Signed)
CSW met with pt today to assist with d/c planning. Pt plans to return home with family support and [facility]. RNCM will continue to assist with d/c planning needs.  Cori Razor LCSW [phone]

## 2012-06-05 NOTE — Progress Notes (Signed)
   Subjective: 2 Days Post-Op Procedure(s) (LRB): TOTAL KNEE ARTHROPLASTY (Left) Patient reports pain as mild and moderate.   Patient seen in rounds with Dr. Lequita Halt. Patient is well, but has had some minor complaints of pain in the knee, requiring pain medications Plan is to go home after hospital stay.  Objective: Vital signs in last 24 hours: Temp:  [98.2 F (36.8 C)-100.2 F (37.9 C)] 100.1 F (37.8 C) (08/14 0530) Pulse Rate:  [76-88] 88  (08/14 0530) Resp:  [16-18] 16  (08/14 0530) BP: (93-116)/(52-75) 116/71 mmHg (08/14 0530) SpO2:  [94 %-99 %] 94 % (08/14 0530)  Intake/Output from previous day:  Intake/Output Summary (Last 24 hours) at 06/05/12 0927 Last data filed at 06/05/12 0600  Gross per 24 hour  Intake 1857.5 ml  Output   2175 ml  Net -317.5 ml    Intake/Output this shift:    Labs:  Basename 06/05/12 0506 06/04/12 0418  HGB 10.0* 9.8*    Basename 06/05/12 0506 06/04/12 0418  WBC 11.7* 11.5*  RBC 3.57* 3.53*  HCT 30.3* 29.3*  PLT 234 240    Basename 06/05/12 0506 06/04/12 0418  NA 134* 136  K 4.0 4.0  CL 100 103  CO2 27 25  BUN 11 11  CREATININE 0.78 0.70  GLUCOSE 110* 145*  CALCIUM 8.5 8.4   No results found for this basename: LABPT:2,INR:2 in the last 72 hours  EXAM General - Patient is Alert, Appropriate and Oriented Extremity - Neurovascular intact Sensation intact distally Dorsiflexion/Plantar flexion intact No cellulitis present Dressing/Incision - clean, dry, no drainage, healing Motor Function - intact, moving foot and toes well on exam.   Past Medical History  Diagnosis Date  . Hypertension     clearance Dr Gerda Diss with note on chart  . Hypothyroidism   . Reactive airway disease   . Pneumonia 1/13  . GERD (gastroesophageal reflux disease)   . Arthritis   . DDD (degenerative disc disease)     Assessment/Plan: 2 Days Post-Op Procedure(s) (LRB): TOTAL KNEE ARTHROPLASTY (Left) Principal Problem:  *OA (osteoarthritis)  of knee Active Problems:  Postop Hyponatremia  Expected Blood loss anemia   Advance diet Up with therapy Plan for discharge tomorrow Discharge home with home health  DVT Prophylaxis - Xarelto, 81 ng ASA on hold for now. Weight-Bearing as tolerated to left leg  Ashlee Solomon 06/05/2012, 9:27 AM

## 2012-06-05 NOTE — Progress Notes (Signed)
Comments:  06/05/2012 Raynelle Bring BSN CCm [phone] Per patient decision has been made that she will return to her home in [place], [place] where spouse will be caregiver > She plans to use [facility] agency for HHpt which she set up prior to admission to hospital. I called [facility]-)([phone]) ans spoke with [place] [facility] in intake. She states 1st visit will be friday-06/07/2012. Face sheet, hh orders, H&P, op note faxed to [phone] with confirmation. Pt will need bariatric RW and 3n1. Advanced notified and will f/u in am. CM to follow

## 2012-06-05 NOTE — Progress Notes (Signed)
CSW is available to assist with d/c planning to SNF if recommended by PT. Pt has [facility] which requires prior approval for SNF placement. CSW will meet with pt following PT treatment this am to assist with d/c planning.  Cori Razor LCSW [phone]

## 2012-06-05 NOTE — Progress Notes (Signed)
Physical Therapy Treatment Patient Details Name: Ashlee Solomon MRN: [id number] DOB: 10/30/1950 Today's Date: 06/05/2012 Time: 1130-1200 PT Time Calculation (min): 30 min  PT Assessment / Plan / Recommendation Comments on Treatment Session  Assisted pt out of recliner to [facility] then amb in hallway.  Pt requesting to go back to bed, "I hate that chair".  Pt wants to D/C to home vs SNF and states her husband can assist her.  Asked pt to have husband attend a PT session to be educated.    Follow Up Recommendations  Home health PT    Barriers to Discharge        Equipment Recommendations  Rolling walker with 5" wheels;3 in 1 bedside comode (both bariatric)    Recommendations for Other Services    Frequency 7X/week   Plan Discharge plan remains appropriate    Precautions / Restrictions Precautions Precautions: Knee Precaution Comments: Instructed Pt on KI use and proper application Required Braces or Orthoses: Knee Immobilizer - Left Knee Immobilizer - Left: Discontinue once straight leg raise with < 10 degree lag Restrictions Weight Bearing Restrictions: No LLE Weight Bearing: Weight bearing as tolerated    Pertinent Vitals/Pain C/o 6/10 L knee pain ICE applied    Mobility  Bed Mobility Bed Mobility: Not assessed Supine to Sit: 3: Mod assist Details for Bed Mobility Assistance: Pt OOB in chair  Transfers Transfers: Sit to Stand;Stand to Sit Sit to Stand: 3: Mod assist;From chair/3-in-1 Stand to Sit: 3: Mod assist;To chair/3-in-1 Details for Transfer Assistance: 25% VC's on proper tech and hand placement and increased time  Ambulation/Gait Ambulation/Gait Assistance: 4: Min assist (x one posterior LOB) Ambulation Distance (Feet): 50 Feet Assistive device: Rolling walker Ambulation/Gait Assistance Details: increased time and 25% VC's on safety with turns and backward gait Gait Pattern: Step-to pattern;Shuffle Gait velocity: decreased    PT Goals                        progressing    Visit Information  Last PT Received On: 06/05/12 Assistance Needed: +1          Balance   fair  End of Session PT - End of Session Equipment Utilized During Treatment: Gait belt Activity Tolerance: Patient tolerated treatment well Patient left: in chair   Felecia Shelling  PTA [facility]  Acute  Rehab Pager     [phone]

## 2012-06-05 NOTE — Evaluation (Signed)
Occupational Therapy Evaluation Patient Details Name: Ashlee Solomon MRN: [id number] DOB: 1951-02-23 Today's Date: 06/05/2012 Time: 6962-9528 OT Time Calculation (min): 54 min  OT Assessment / Plan / Recommendation Clinical Impression  Pt presents with LTKR POD 2. Skilled OT recommended to maximize independence with BADLs to supervision level in prep for safe d/c home with HHOT.    OT Assessment  Patient needs continued OT Services    Follow Up Recommendations  Home health OT    Barriers to Discharge      Equipment Recommendations  3 in 1 bedside comode (bariatric)    Recommendations for Other Services    Frequency  Min 2X/week    Precautions / Restrictions Precautions Precautions: Knee Required Braces or Orthoses: Knee Immobilizer - Left Knee Immobilizer - Left: Discontinue once straight leg raise with < 10 degree lag Restrictions Weight Bearing Restrictions: No LLE Weight Bearing: Weight bearing as tolerated   Pertinent Vitals/Pain Reported 7/10 pain at end of session. Pt repositioned and cold applied.    ADL  Grooming: Performed;Teeth care;Min guard Where Assessed - Grooming: Supported standing Upper Body Bathing: Performed;Set up Where Assessed - Upper Body Bathing: Unsupported sitting Lower Body Bathing: Performed;Minimal assistance Where Assessed - Lower Body Bathing: Supported sit to stand Upper Body Dressing: Performed;Set up Where Assessed - Upper Body Dressing: Unsupported sitting Lower Body Dressing: Performed;Maximal assistance Where Assessed - Lower Body Dressing: Supported sit to stand Toilet Transfer: Performed;Minimal assistance Toilet Transfer Method: Sit to Barista: Materials engineer and Hygiene: Performed;Min guard Where Assessed - Engineer, mining and Hygiene: Sit to stand from 3-in-1 or toilet Equipment Used: Rolling walker Transfers/Ambulation Related to ADLs: Pt ambulated  to the bathroom with min A and vcs for technique and step sequence. ADL Comments: Pt required max A to thread LEs into pant legs and don/doff socks. Would benefit from AE practice. Max time and effort needed for all functional tasks.    OT Diagnosis: Generalized weakness  OT Problem List: Decreased safety awareness;Decreased activity tolerance;Decreased knowledge of use of DME or AE;Pain;Obesity OT Treatment Interventions: Self-care/ADL training;Therapeutic activities;DME and/or AE instruction;Patient/family education   OT Goals Acute Rehab OT Goals OT Goal Formulation: With patient Time For Goal Achievement: 06/12/12 Potential to Achieve Goals: Good ADL Goals Pt Will Perform Grooming: with supervision;Standing at sink ADL Goal: Grooming - Progress: Goal set today Pt Will Perform Lower Body Bathing: with supervision;Sit to stand from chair;Sit to stand from bed;with adaptive equipment ADL Goal: Lower Body Bathing - Progress: Goal set today Pt Will Perform Lower Body Dressing: with supervision;Sit to stand from bed;Sit to stand from chair;with adaptive equipment ADL Goal: Lower Body Dressing - Progress: Goal set today Pt Will Transfer to Toilet: with supervision;Ambulation;Comfort height toilet;3-in-1 ADL Goal: Toilet Transfer - Progress: Goal set today Pt Will Perform Toileting - Clothing Manipulation: with supervision;Standing ADL Goal: Toileting - Clothing Manipulation - Progress: Goal set today Pt Will Perform Toileting - Hygiene: with supervision;Sit to stand from 3-in-1/toilet ADL Goal: Toileting - Hygiene - Progress: Goal set today Pt Will Perform Tub/Shower Transfer: Tub transfer;Transfer tub bench;Ambulation ADL Goal: Tub/Shower Transfer - Progress: Goal set today  Visit Information  Last OT Received On: 06/05/12 Assistance Needed: +1    Subjective Data  Subjective: I'm going home!!! Patient Stated Goal: See above   Prior Functioning  Vision/Perception  Home  Living Lives With: Spouse Available Help at Discharge: Family;Available PRN/intermittently Type of Home: House Home Access: Stairs to enter [employer]  of Steps: 1+1 Entrance Stairs-Rails: None Home Layout: Two level;Able to live on main level with bedroom/bathroom Bathroom Shower/Tub: Engineer, manufacturing systems: Handicapped height Home Adaptive Equipment: Tub transfer bench;Walker - standard Prior Function Level of Independence: Independent Able to Take Stairs?: Yes Driving: Yes Vocation: Full time employment Communication Communication: No difficulties Dominant Hand: Right      Cognition  Overall Cognitive Status: Appears within functional limits for tasks assessed/performed Arousal/Alertness: Awake/alert Orientation Level: Appears intact for tasks assessed Behavior During Session: [facility] for tasks performed    Extremity/Trunk Assessment Right Upper Extremity Assessment RUE ROM/Strength/Tone: [facility] for tasks assessed Left Upper Extremity Assessment LUE ROM/Strength/Tone: WFL for tasks assessed   Mobility Bed Mobility Supine to Sit: 3: Mod assist Details for Bed Mobility Assistance: Max VCs for safety, technique. Assist needed for LLE and trunk. Transfers Sit to Stand: 4: Min assist;With upper extremity assist;From chair/3-in-1;With armrests Stand to Sit: 4: Min assist;With upper extremity assist;With armrests;To chair/3-in-1 Details for Transfer Assistance: VCs for hand placement and LLE management.   Exercise    Balance    End of Session OT - End of Session Activity Tolerance: Patient tolerated treatment well Patient left: in chair;with call bell/phone within reach;with family/visitor present  GO     Destyn Schuyler A OTR/L [phone] 06/05/2012, 9:41 AM

## 2012-06-05 NOTE — Plan of Care (Signed)
Problem: Phase III Progression Outcomes Goal: Anticoagulant follow-up in place Outcome: Not Applicable Date Met:  06/05/12 Patient on xarelto

## 2012-06-06 DIAGNOSIS — E876 Hypokalemia: Secondary | ICD-10-CM | POA: Diagnosis not present

## 2012-06-06 LAB — BASIC METABOLIC PANEL
CO2: 27 mEq/L (ref 19–32)
Chloride: 93 mEq/L — ABNORMAL LOW (ref 96–112)
GFR calc Af Amer: >90 mL/min (ref >90)
Potassium: 3.4 mEq/L — ABNORMAL LOW (ref 3.5–5.1)

## 2012-06-06 LAB — CBC
HCT: 30.5 % — ABNORMAL LOW (ref 36.0–46.0)
MCV: 83.6 fL (ref 78.0–100.0)
Platelets: 248 10*3/uL (ref 150–400)
RBC: 3.65 MIL/uL — ABNORMAL LOW (ref 3.87–5.11)
RDW: 14.4 % (ref 11.5–15.5)
WBC: 12.5 10*3/uL — ABNORMAL HIGH (ref 4.0–10.5)

## 2012-06-06 MED ORDER — POTASSIUM CHLORIDE CRYS ER 20 MEQ PO TBCR
40.0000 meq | EXTENDED_RELEASE_TABLET | Freq: Every day | ORAL | Status: DC
  Administered 2012-06-06: 40 meq via ORAL
  Filled 2012-06-06: qty 2

## 2012-06-06 MED ORDER — OXYCODONE HCL 5 MG PO TABS: 5.0000 mg | ORAL_TABLET | ORAL | Status: AC | PRN

## 2012-06-06 MED ORDER — METHOCARBAMOL 500 MG PO TABS: 500.0000 mg | ORAL_TABLET | Freq: Four times a day (QID) | ORAL | Status: AC | PRN

## 2012-06-06 MED ORDER — POTASSIUM CHLORIDE CRYS ER 20 MEQ PO TBCR: 40.0000 meq | EXTENDED_RELEASE_TABLET | Freq: Every day | ORAL | Status: DC

## 2012-06-06 MED ORDER — RIVAROXABAN 10 MG PO TABS: 10.0000 mg | ORAL_TABLET | Freq: Every day | ORAL | Status: DC

## 2012-06-06 NOTE — Progress Notes (Signed)
   Subjective: 3 Days Post-Op Procedure(s) (LRB): TOTAL KNEE ARTHROPLASTY (Left) Patient reports pain as mild.   Patient seen in rounds with Dr. Lequita Halt. Patient is well, and has had no acute complaints or problems Patient is ready to go home today after therapy.  Objective: Vital signs in last 24 hours: Temp:  [99.5 F (37.5 C)-100.4 F (38 C)] 99.5 F (37.5 C) (08/15 0435) Pulse Rate:  [82-98] 90  (08/15 0810) Resp:  [16-20] 16  (08/15 0435) BP: (102-109)/(66-71) 102/66 mmHg (08/15 0810) SpO2:  [96 %-97 %] 96 % (08/15 0435)  Intake/Output from previous day:  Intake/Output Summary (Last 24 hours) at 06/06/12 0843 Last data filed at 06/06/12 0400  Gross per 24 hour  Intake    840 ml  Output   2525 ml  Net  -1685 ml    Intake/Output this shift:    Labs:  Basename 06/06/12 0427 06/05/12 0506 06/04/12 0418  HGB 10.2* 10.0* 9.8*    Basename 06/06/12 0427 06/05/12 0506  WBC 12.5* 11.7*  RBC 3.65* 3.57*  HCT 30.5* 30.3*  PLT 248 234    Basename 06/06/12 0427 06/05/12 0506  NA 130* 134*  K 3.4* 4.0  CL 93* 100  CO2 27 27  BUN 9 11  CREATININE 0.75 0.78  GLUCOSE 116* 110*  CALCIUM 9.0 8.5   No results found for this basename: LABPT:2,INR:2 in the last 72 hours  EXAM: General - Patient is Alert, Appropriate and Oriented Extremity - Neurovascular intact Sensation intact distally Dorsiflexion/Plantar flexion intact No cellulitis present Incision - clean, dry, no drainage, healing Motor Function - intact, moving foot and toes well on exam.   Assessment/Plan: 3 Days Post-Op Procedure(s) (LRB): TOTAL KNEE ARTHROPLASTY (Left) Procedure(s) (LRB): TOTAL KNEE ARTHROPLASTY (Left) Past Medical History  Diagnosis Date  . Hypertension     clearance Dr Gerda Diss with note on chart  . Hypothyroidism   . Reactive airway disease   . Pneumonia 1/13  . GERD (gastroesophageal reflux disease)   . Arthritis   . DDD (degenerative disc disease)    Principal Problem:  *OA (osteoarthritis) of knee Active Problems:  Postop Hyponatremia  Expected Blood loss anemia  Postop Hypokalemia   Discharge home with home health Diet - Cardiac diet Follow up - in 2 weeks Activity - WBAT Disposition - Home Condition Upon Discharge - Good D/C Meds - See DC Summary DVT Prophylaxis - Xarelto, 81 ng ASA on hold for now.   [name] 06/06/2012, 8:43 AM

## 2012-06-06 NOTE — Discharge Summary (Signed)
Physician Discharge Summary   Patient ID: Ashlee Solomon MRN: [id number] DOB/AGE: [birth year] 63 y.o.  Admit date: 06/03/2012 Discharge date: 06/06/2012  Primary Diagnosis: Osteoarthritis Left knee   Admission Diagnoses:  Past Medical History  Diagnosis Date  . Hypertension     clearance Dr Gerda Diss with note on chart  . Hypothyroidism   . Reactive airway disease   . Pneumonia 1/13  . GERD (gastroesophageal reflux disease)   . Arthritis   . DDD (degenerative disc disease)    Discharge Diagnoses:   Principal Problem:  *OA (osteoarthritis) of knee Active Problems:  Postop Hyponatremia  Expected Blood loss anemia  Postop Hypokalemia  Procedure:  Procedure(s) (LRB): TOTAL KNEE ARTHROPLASTY (Left)   Consults: None  HPI: Ashlee Solomon is a 61 y.o. year old female with end stage OA of her left knee with progressively worsening pain and dysfunction. She has constant pain, with activity and at rest and significant functional deficits with difficulties even with ADLs. She has had extensive non-op management including analgesics, injections of cortisone and viscosupplements, and home exercise program, but remains in significant pain with significant dysfunction. Radiographs show bone on bone arthritis medial and patellofemoral. She presents now for left Total Knee Arthroplasty.       Laboratory Data: Hospital Outpatient Visit on 05/24/2012  Component Date Value Range Status  . MRSA, PCR 05/24/2012 NEGATIVE  NEGATIVE Final  . Staphylococcus aureus 05/24/2012 NEGATIVE  NEGATIVE Final   Comment:                                 The Xpert SA Assay (FDA                          approved for NASAL specimens                          only), is one component of                          a comprehensive surveillance                          program.  It is not intended                          to diagnose infection nor to                          guide or monitor treatment.  . WBC  05/24/2012 9.8  4.0 - 10.5 K/uL Final  . RBC 05/24/2012 4.48  3.87 - 5.11 MIL/uL Final  . Hemoglobin 05/24/2012 12.5  12.0 - 15.0 g/dL Final  . HCT 40/98/1191 37.4  36.0 - 46.0 % Final  . MCV 05/24/2012 83.5  78.0 - 100.0 fL Final  . MCH 05/24/2012 27.9  26.0 - 34.0 pg Final  . MCHC 05/24/2012 33.4  30.0 - 36.0 g/dL Final  . RDW 47/82/9562 13.7  11.5 - 15.5 % Final  . Platelets 05/24/2012 268  150 - 400 K/uL Final  . Sodium 05/24/2012 137  135 - 145 mEq/L Final  . Potassium 05/24/2012 3.6  3.5 - 5.1 mEq/L Final  . Chloride 05/24/2012 100  96 - 112 mEq/L Final  .  CO2 05/24/2012 29  19 - 32 mEq/L Final  . Glucose, Bld 05/24/2012 89  70 - 99 mg/dL Final  . BUN 40/98/1191 16  6 - 23 mg/dL Final  . Creatinine, Ser 05/24/2012 0.76  0.50 - 1.10 mg/dL Final  . Calcium 47/82/9562 9.9  8.4 - 10.5 mg/dL Final  . Total Protein 05/24/2012 6.9  6.0 - 8.3 g/dL Final  . Albumin 13/05/6577 3.9  3.5 - 5.2 g/dL Final  . AST 46/96/2952 20  0 - 37 U/L Final  . ALT 05/24/2012 8  0 - 35 U/L Final  . Alkaline Phosphatase 05/24/2012 74  39 - 117 U/L Final  . Total Bilirubin 05/24/2012 0.3  0.3 - 1.2 mg/dL Final  . GFR calc non Af Amer 05/24/2012 89* >90 mL/min Final  . GFR calc Af Amer 05/24/2012 >90  >90 mL/min Final   Comment:                                 The eGFR has been calculated                          using the CKD EPI equation.                          This calculation has not been                          validated in all clinical                          situations.                          eGFR's persistently                          <90 mL/min signify                          possible Chronic Kidney Disease.  Marland Kitchen aPTT 05/24/2012 39* 24 - 37 seconds Final   Comment:                                 IF BASELINE aPTT IS ELEVATED,                          SUGGEST PATIENT RISK ASSESSMENT                          BE USED TO DETERMINE APPROPRIATE                          ANTICOAGULANT THERAPY.    . Prothrombin Time 05/24/2012 13.2  11.6 - 15.2 seconds Final  . INR 05/24/2012 0.98  0.00 - 1.49 Final  . Color, Urine 05/24/2012 YELLOW  YELLOW Final  . APPearance 05/24/2012 CLEAR  CLEAR Final  . Specific Gravity, Urine 05/24/2012 1.012  1.005 - 1.030 Final  . pH 05/24/2012 6.5  5.0 - 8.0 Final  . Glucose, UA 05/24/2012 NEGATIVE  NEGATIVE mg/dL Final  . Hgb urine  dipstick 05/24/2012 NEGATIVE  NEGATIVE Final  . Bilirubin Urine 05/24/2012 NEGATIVE  NEGATIVE Final  . Ketones, ur 05/24/2012 NEGATIVE  NEGATIVE mg/dL Final  . Protein, ur 16/07/9603 NEGATIVE  NEGATIVE mg/dL Final  . Urobilinogen, UA 05/24/2012 0.2  0.0 - 1.0 mg/dL Final  . Nitrite 54/06/8118 NEGATIVE  NEGATIVE Final  . Leukocytes, UA 05/24/2012 NEGATIVE  NEGATIVE Final   MICROSCOPIC NOT DONE ON URINES WITH NEGATIVE PROTEIN, BLOOD, LEUKOCYTES, NITRITE, OR GLUCOSE <1000 mg/dL.    Basename 06/06/12 0427 06/05/12 0506 06/04/12 0418  HGB 10.2* 10.0* 9.8*    Basename 06/06/12 0427 06/05/12 0506  WBC 12.5* 11.7*  RBC 3.65* 3.57*  HCT 30.5* 30.3*  PLT 248 234    Basename 06/06/12 0427 06/05/12 0506  NA 130* 134*  K 3.4* 4.0  CL 93* 100  CO2 27 27  BUN 9 11  CREATININE 0.75 0.78  GLUCOSE 116* 110*  CALCIUM 9.0 8.5   No results found for this basename: LABPT:2,INR:2 in the last 72 hours  X-Rays:Dg Chest 2 View  05/24/2012  *RADIOLOGY REPORT*  Clinical Data: Preoperative left knee arthroplasty.  Preoperative chest radiograph.  CHEST - 2 VIEW  Comparison: None.  Findings: Mild left basilar atelectasis.  No airspace disease.  No effusion.  Cardiopericardial silhouette within normal limits. Mediastinal contours normal.  The bilateral AC joint osteoarthritis.  IMPRESSION: No active cardiopulmonary disease.  Mild right basilar atelectasis.  Original Report Authenticated By: Andreas Newport, M.D.    EKG: Orders placed during the hospital encounter of 05/24/12  . EKG 12-LEAD  . EKG 12-LEAD     Hospital Course: Patient was  admitted to [facility] and taken to the OR and underwent the above state procedure without complications.  Patient tolerated the procedure well and was later transferred to the recovery room and then to the orthopaedic floor for postoperative care.  They were given PO and IV analgesics for pain control following their surgery.  They were given 24 hours of postoperative antibiotics and started on DVT prophylaxis in the form of Xarelto.   PT and OT were ordered for total joint protocol.  Discharge planning consulted to help with postop disposition and equipment needs.  Patient had a tough night on the evening of surgery but started to get up OOB with therapy on day one.  PCA Morphine was discontinued and they were weaned over to PO meds.  Hemovac drain was pulled without difficulty.  Continued to work with therapy into day two.  Dressing was changed on day two and the incision was healing well.  By day three, the patient had progressed with therapy and meeting their goals.  Incision was healing well.  Patient was seen in rounds and was ready to go home.  Discharge Medications: Prior to Admission medications   Medication Sig Start Date End Date Taking? Authorizing Provider  hydrochlorothiazide (HYDRODIURIL) 25 MG tablet Take 25 mg by mouth daily with breakfast.    Yes Historical Provider, MD  levothyroxine (SYNTHROID, LEVOTHROID) 175 MCG tablet Take 175 mcg by mouth daily before breakfast.   Yes Historical Provider, MD  loratadine (CLARITIN) 10 MG tablet Take 10 mg by mouth daily with breakfast.    Yes Historical Provider, MD  magnesium oxide (MAG-OX) 400 MG tablet Take 400 mg by mouth at bedtime.    Yes Historical Provider, MD  metoprolol succinate (TOPROL-XL) 50 MG 24 hr tablet Take 50 mg by mouth daily with breakfast. Take with or immediately following a meal.   Yes  Historical Provider, MD  Potassium 95 MG TABS Take by mouth. OTC- pt will verify dosage  05/24/12   Yes Historical Provider, MD    albuterol (PROVENTIL HFA;VENTOLIN HFA) 108 (90 BASE) MCG/ACT inhaler Inhale 2 puffs into the lungs every 6 (six) hours as needed. For shortness of breath    Historical Provider, MD  methocarbamol (ROBAXIN) 500 MG tablet Take 1 tablet (500 mg total) by mouth every 6 (six) hours as needed. 06/06/12 06/16/12  Alexzandrew Perkins, PA  oxyCODONE (OXY IR/ROXICODONE) 5 MG immediate release tablet Take 1-2 tablets (5-10 mg total) by mouth every 4 (four) hours as needed for pain. 06/06/12 06/16/12  Alexzandrew Julien Girt, PA  rivaroxaban (XARELTO) 10 MG TABS tablet Take 1 tablet (10 mg total) by mouth daily with breakfast. Take Xarelto for two and a half more weeks, then discontinue Xarelto. Once the patient has completed the Xarelto, they may resume the 81 mg Aspirin. 06/06/12   Alexzandrew Julien Girt, PA    Diet: Cardiac diet Activity:WBAT Follow-up:in 2 weeks Disposition - Home Discharged Condition: good   Discharge Orders    Future Orders Please Complete By Expires   Diet - low sodium heart healthy      Call MD / Call 911      Comments:   If you experience chest pain or shortness of breath, CALL 911 and be transported to the hospital emergency room.  If you develope a fever above 101 F, pus (white drainage) or increased drainage or redness at the wound, or calf pain, call your surgeon's office.   Discharge instructions      Comments:   Pick up stool softner and laxative for home. Do not submerge incision under water. May shower. Continue to use ice for pain and swelling from surgery.  Take Xarelto for two and a half more weeks, then discontinue Xarelto. Once the patient has completed the Xarelto, they may resume the 81 mg Aspirin.   Constipation Prevention      Comments:   Drink plenty of fluids.  Prune juice may be helpful.  You may use a stool softener, such as Colace (over the counter) 100 mg twice a day.  Use MiraLax (over the counter) for constipation as needed.   Increase activity slowly as  tolerated      Patient may shower      Comments:   You may shower without a dressing once there is no drainage.  Do not wash over the wound.  If drainage remains, do not shower until drainage stops.   Driving restrictions      Comments:   No driving until released by the physician.   Lifting restrictions      Comments:   No lifting until released by the physician.   TED hose      Comments:   Use stockings (TED hose) for 3 weeks on both leg(s).  You may remove them at night for sleeping.   Change dressing      Comments:   Change dressing daily with sterile 4 x 4 inch gauze dressing and apply TED hose. Do not submerge the incision under water.   Do not put a pillow under the knee. Place it under the heel.      Do not sit on low chairs, stoools or toilet seats, as it may be difficult to get up from low surfaces        Medication List  As of 06/06/2012  8:49 AM   STOP taking these medications  aspirin 81 MG chewable tablet      celecoxib 200 MG capsule      Chromium 400 MCG Tabs      glucosamine-chondroitin 500-400 MG tablet      OVER THE COUNTER MEDICATION      VITAMIN-B COMPLEX PO         TAKE these medications         albuterol 108 (90 BASE) MCG/ACT inhaler   Commonly known as: PROVENTIL HFA;VENTOLIN HFA   Inhale 2 puffs into the lungs every 6 (six) hours as needed. For shortness of breath      hydrochlorothiazide 25 MG tablet   Commonly known as: HYDRODIURIL   Take 25 mg by mouth daily with breakfast.      levothyroxine 175 MCG tablet   Commonly known as: SYNTHROID, LEVOTHROID   Take 175 mcg by mouth daily before breakfast.      loratadine 10 MG tablet   Commonly known as: CLARITIN   Take 10 mg by mouth daily with breakfast.      magnesium oxide 400 MG tablet   Commonly known as: MAG-OX   Take 400 mg by mouth at bedtime.      methocarbamol 500 MG tablet   Commonly known as: ROBAXIN   Take 1 tablet (500 mg total) by mouth every 6 (six) hours as  needed.      metoprolol succinate 50 MG 24 hr tablet   Commonly known as: TOPROL-XL   Take 50 mg by mouth daily with breakfast. Take with or immediately following a meal.      oxyCODONE 5 MG immediate release tablet   Commonly known as: Oxy IR/ROXICODONE   Take 1-2 tablets (5-10 mg total) by mouth every 4 (four) hours as needed for pain.      Potassium 95 MG Tabs   Take by mouth. OTC- pt will verify dosage  05/24/12      rivaroxaban 10 MG Tabs tablet   Commonly known as: XARELTO   Take 1 tablet (10 mg total) by mouth daily with breakfast. Take Xarelto for two and a half more weeks, then discontinue Xarelto.  Once the patient has completed the Xarelto, they may resume the 81 mg Aspirin.           Follow-up Information    Follow up with Loanne Drilling, MD. Schedule an appointment as soon as possible for a visit in 2 weeks.   Contact information:   [facility] [address], [address] [place] [address] [phone]          Signed: [name] 06/06/2012, 8:49 AM

## 2012-06-06 NOTE — Progress Notes (Signed)
CARE MANAGEMENT NOTE 06/06/2012  Patient:  Ashlee Solomon, Ashlee Solomon   Account Number:  [id number]  Date Initiated:  06/03/2012  Documentation initiated by:  Colleen Can  Subjective/Objective Assessment:   dx osteoarthritis; total left knee replacemnt     Action/Plan:   CM will interview patient after pt/ot eval is done   Anticipated DC Date:  06/06/2012   Anticipated DC Plan:  HOME W HOME HEALTH SERVICES  In-house referral  Clinical Social Worker      DC Associate Professor  CM consult      [facility] Choice  HOME HEALTH  DURABLE MEDICAL EQUIPMENT   Choice offered to / List presented to:  C-1 Patient   DME arranged  OTHER - SEE COMMENT      DME agency  [facility]     HH arranged  HH-2 PT      [facility] agency  [facility]   Status of service:  Completed, signed off Medicare Important Message given?  NO (If response is "NO", the following Medicare IM given date fields will be blank) Date Medicare IM given:   Date Additional Medicare IM given:    Discharge Disposition:  HOME W HOME HEALTH SERVICES  Per UR Regulation:  Reviewed for med. necessity/level of care/duration of stay   Comments:  06/06/2012 Raynelle Bring BSN CCM [phone] Pt states spouse has purchased RW and 3n1 and will not need DME from [facility]. Pt for discharge today. Services will start tomorrow 06/07/2012. Agency notified of patient's discharge

## 2012-06-06 NOTE — Progress Notes (Signed)
Physical Therapy Treatment Patient Details Name: MAKALIA BARE MRN: [id number] DOB: 08/20/1951 Today's Date: 06/06/2012 Time: 1000-1033 PT Time Calculation (min): 33 min  PT Assessment / Plan / Recommendation Comments on Treatment Session  Spouse present during session, demonstrated and instructed on negociating one step enterance and car transfers.  Pt ready for D/C to home.    Follow Up Recommendations  Home health PT    Barriers to Discharge        Equipment Recommendations       Recommendations for Other Services    Frequency 7X/week   Plan Discharge plan remains appropriate    Precautions / Restrictions   knee immobilizer for OOB act until able to perform 10 active SLR  Pertinent Vitals/Pain C/o "soreness" Applied ICE    Mobility  Bed Mobility Bed Mobility: Not assessed Details for Bed Mobility Assistance: Pt OOB in recliner  Transfers Transfers: Sit to Stand;Stand to Sit Sit to Stand: 5: Supervision;From chair/3-in-1 Stand to Sit: 5: Supervision;To chair/3-in-1 Details for Transfer Assistance: increased time  Ambulation/Gait Ambulation/Gait Assistance: 4: Min guard Ambulation Distance (Feet): 45 Feet Assistive device: Rolling walker Ambulation/Gait Assistance Details: with spouse and increased time Gait Pattern: Step-to pattern;Decreased stance time - left  Stairs: Yes Stairs Assistance: 4: Min guard Stair Management Technique: No rails;Backwards;With walker Number of Stairs: 1      PT Goals                       progressing    Visit Information  Last PT Received On: 06/06/12                   End of Session PT - End of Session Equipment Utilized During Treatment: Gait belt;Left knee immobilizer Activity Tolerance: Patient tolerated treatment well Patient left: in chair;with call bell/phone within reach;with family/visitor present Nurse Communication: Other (comment) (Pt ready for D/C to home)   Felecia Shelling  PTA [facility]  Acute  Rehab Pager      [phone]

## 2012-06-06 NOTE — Progress Notes (Signed)
Occupational Therapy Treatment Patient Details Name: Ashlee Solomon MRN: [id number] DOB: [birth year] Today's Date: 06/06/2012 Time: 7829-5621 OT Time Calculation (min): 33 min  OT Assessment / Plan / Recommendation Comments on Treatment Session Pt did well with reacher use for LB dressing. Husband present and assisting hands on with ADL. educated on where he needs to stand to assist with steadying as needed when pt pulling up clothing. Per spouse, pt now has all DME at home that he purchased.     Follow Up Recommendations  Home health OT    Barriers to Discharge       Equipment Recommendations  None recommended by OT    Recommendations for Other Services    Frequency Min 2X/week   Plan Discharge plan remains appropriate    Precautions / Restrictions Precautions Precautions: Knee Required Braces or Orthoses: Knee Immobilizer - Left Knee Immobilizer - Left: Discontinue once straight leg raise with < 10 degree lag Restrictions LLE Weight Bearing: Weight bearing as tolerated        ADL  Lower Body Dressing: Performed;Minimal assistance;Other (comment) (with reacher for pants only. increased time) Where Assessed - Lower Body Dressing: Supported sit to stand Toilet Transfer: Simulated;Min Geophysicist/field seismologist Method: Sit to stand Equipment Used: Rolling walker;Long-handled shoe horn;Long-handled sponge;Reacher;Sock aid ADL Comments: Demonstrated sock aid, reacher, shoe horn and sponge. Pt states she will likely not wear socks and has slide on shoes. Interested in reacher and sponge however. Educated on AE kit in gift shop versus at [employer]. Pt used reacher to don pants but with increased time as she has difficulty lifting L LE off the floor to get pants around foot. Educated hsuband and he assisted hands on with donning KI. Explained when she needs to wear KI. Demosntrated tub transfer technique X2 and pt verbalizes undestanding. She declines need to practice here and  states she will sponge bathe a few days initially. Husband present and verbalizes technique also. Pt husband states she has a RW now at home that he purchased and already has a raised toilet seat with handles.     OT Diagnosis:    OT Problem List:   OT Treatment Interventions:     OT Goals ADL Goals ADL Goal: Lower Body Dressing - Progress: Progressing toward goals ADL Goal: Toilet Transfer - Progress: Progressing toward goals  Visit Information  Last OT Received On: 06/06/12 Assistance Needed: +1    Subjective Data  Subjective: I am going home Patient Stated Goal: agreeable to work on Nurse, adult for donning pants.    Prior Functioning       Cognition  Overall Cognitive Status: Appears within functional limits for tasks assessed/performed Arousal/Alertness: Awake/alert Orientation Level: Appears intact for tasks assessed Behavior During Session: [facility] for tasks performed    Mobility Bed Mobility Bed Mobility: Not assessed Details for Bed Mobility Assistance: Pt OOB in recliner Transfers Transfers: Sit to Stand;Stand to Sit Sit to Stand: 4: Min guard;From chair/3-in-1;With upper extremity assist Stand to Sit: 4: Min guard;With upper extremity assist;To chair/3-in-1 Details for Transfer Assistance: increased time and min verbal cues hand placement   Exercises    Balance Balance Balance Assessed: Yes Dynamic Standing Balance Dynamic Standing - Level of Assistance: 4: Min assist  End of Session OT - End of Session Activity Tolerance: Patient tolerated treatment well Patient left: in chair;with call bell/phone within reach;with family/visitor present  GO     Lennox Laity [phone] 06/06/2012, 12:40 PM

## 2013-05-03 ENCOUNTER — Other Ambulatory Visit: Payer: Self-pay | Admitting: Nurse Practitioner

## 2013-07-11 ENCOUNTER — Telehealth: Payer: Self-pay | Admitting: Family Medicine

## 2013-07-11 DIAGNOSIS — Z79899 Other long term (current) drug therapy: Secondary | ICD-10-CM

## 2013-07-11 DIAGNOSIS — E039 Hypothyroidism, unspecified: Secondary | ICD-10-CM

## 2013-07-11 DIAGNOSIS — E782 Mixed hyperlipidemia: Secondary | ICD-10-CM

## 2013-07-11 NOTE — Telephone Encounter (Signed)
Pt needs BW papers for PE on 09/05/13

## 2013-07-21 NOTE — Telephone Encounter (Signed)
Blood work ordered in Epic. Patient notified. 

## 2013-07-21 NOTE — Telephone Encounter (Signed)
Lip liv m7 tsh 

## 2013-07-22 ENCOUNTER — Other Ambulatory Visit: Payer: Self-pay | Admitting: Family Medicine

## 2013-07-22 DIAGNOSIS — Z139 Encounter for screening, unspecified: Secondary | ICD-10-CM

## 2013-07-31 ENCOUNTER — Ambulatory Visit (HOSPITAL_COMMUNITY)
Admission: RE | Admit: 2013-07-31 | Discharge: 2013-07-31 | Disposition: A | Discharge status: Home / Self Care | Payer: BC Managed Care – PPO | Source: Ambulatory Visit | Attending: Family Medicine | Admitting: Family Medicine

## 2013-07-31 DIAGNOSIS — Z1231 Encounter for screening mammogram for malignant neoplasm of breast: Secondary | ICD-10-CM | POA: Insufficient documentation

## 2013-07-31 DIAGNOSIS — Z139 Encounter for screening, unspecified: Secondary | ICD-10-CM

## 2013-07-31 LAB — HEPATIC FUNCTION PANEL
ALT: 9 U/L (ref 0–35)
AST: 21 U/L (ref 0–37)
Albumin: 4.1 g/dL (ref 3.5–5.2)
Alkaline Phosphatase: 67 U/L (ref 39–117)
Bilirubin, Direct: 0.1 mg/dL (ref 0.0–0.3)
Indirect Bilirubin: 0.3 mg/dL (ref 0.0–0.9)
Total Bilirubin: 0.4 mg/dL (ref 0.3–1.2)
Total Protein: 6.5 g/dL (ref 6.0–8.3)

## 2013-07-31 LAB — BASIC METABOLIC PANEL WITH GFR
BUN: 23 mg/dL (ref 6–23)
CO2: 29 meq/L (ref 19–32)
Calcium: 9.5 mg/dL (ref 8.4–10.5)
Chloride: 103 meq/L (ref 96–112)
Creat: 0.83 mg/dL (ref 0.50–1.10)
Glucose, Bld: 89 mg/dL (ref 70–99)
Potassium: 4.6 meq/L (ref 3.5–5.3)
Sodium: 140 meq/L (ref 135–145)

## 2013-08-01 ENCOUNTER — Other Ambulatory Visit: Payer: Self-pay | Admitting: Family Medicine

## 2013-09-05 ENCOUNTER — Ambulatory Visit (INDEPENDENT_AMBULATORY_CARE_PROVIDER_SITE_OTHER): Payer: BC Managed Care – PPO | Admitting: Nurse Practitioner

## 2013-09-05 ENCOUNTER — Encounter: Payer: Self-pay | Admitting: Nurse Practitioner

## 2013-09-05 VITALS — BP 130/86 | Ht 63.0 in | Wt 285.6 lb

## 2013-09-05 DIAGNOSIS — Z23 Encounter for immunization: Secondary | ICD-10-CM

## 2013-09-05 DIAGNOSIS — Z0000 Encounter for general adult medical examination without abnormal findings: Secondary | ICD-10-CM

## 2013-09-05 DIAGNOSIS — Z01419 Encounter for gynecological examination (general) (routine) without abnormal findings: Secondary | ICD-10-CM

## 2013-09-05 NOTE — Patient Instructions (Signed)
Luvena 2 -3 x per week

## 2013-09-13 ENCOUNTER — Encounter: Payer: Self-pay | Admitting: Nurse Practitioner

## 2013-09-13 NOTE — Progress Notes (Signed)
  Subjective:    Patient ID: Ashlee Solomon, female    DOB: 11-Oct-1951, 62 y.o.   MRN: [id number]  HPI presents for wellness checkup. Same sexual partner. No pelvic pain. No vaginal bleeding. Regular vision and dental exams. Some hemorrhoids at times. Has had her Tdap at work. Has not done very well with her diet or exercise.    Review of Systems  Constitutional: Negative for activity change, appetite change and fatigue.  HENT: Positive for rhinorrhea. Negative for dental problem, hearing loss and sore throat.   Eyes: Negative for visual disturbance.  Respiratory: Negative for cough, chest tightness, shortness of breath and wheezing.   Cardiovascular: Negative for chest pain and leg swelling.  Gastrointestinal: Negative for nausea, vomiting, abdominal pain, diarrhea, constipation and blood in stool.  Genitourinary: Negative for dysuria, urgency, frequency, vaginal bleeding, vaginal discharge, enuresis, difficulty urinating and pelvic pain.       Objective:   Physical Exam  Vitals reviewed. Constitutional: She is oriented to person, place, and time. She appears well-developed. No distress.  HENT:  Right Ear: External ear normal.  Left Ear: External ear normal.  Mouth/Throat: Oropharynx is clear and moist.  Neck: Normal range of motion. Neck supple. No tracheal deviation present. No thyromegaly present.  Cardiovascular: Normal rate, regular rhythm and normal heart sounds.  Exam reveals no gallop.   No murmur heard. Pulmonary/Chest: Effort normal and breath sounds normal.  Abdominal: Soft. She exhibits no distension. There is no tenderness.  Genitourinary: Uterus normal.  Musculoskeletal: She exhibits no edema.  Lymphadenopathy:    She has no cervical adenopathy.  Neurological: She is alert and oriented to person, place, and time.  Skin: Skin is warm and dry. No rash noted.  Psychiatric: She has a normal mood and affect. Her behavior is normal.   Breast exam: No masses noted, axilla  no adenopathy. External GU: Signs of hypoestrogenism noted. No lesions. Bimanual exam normal to limited due to significant central obesity/abdominal girth. Rectal exam normal, no stool for Hemoccult.        Assessment & Plan:   Well woman exam  Routine general medical examination at a health care facility - Plan: POC Hemoccult Bld/Stl (3-Cd Home Screen)  Need for prophylactic vaccination and inoculation against influenza  Morbid obesity  Encouraged regular activity and healthy diet. Goal for weight loss is about 28 pounds or 10% of her body weight. Encouraged daily vitamin D and calcium supplementation. Reviewed lab work from 10 9/14 with patient. Recheck in 6 months, call back sooner. Next wellness checkup in one year.

## 2013-10-30 ENCOUNTER — Other Ambulatory Visit: Payer: Self-pay | Admitting: Family Medicine

## 2014-04-05 ENCOUNTER — Other Ambulatory Visit: Payer: Self-pay | Admitting: Family Medicine

## 2014-04-05 ENCOUNTER — Other Ambulatory Visit: Payer: Self-pay | Admitting: Nurse Practitioner

## 2014-07-04 ENCOUNTER — Other Ambulatory Visit: Payer: Self-pay | Admitting: Family Medicine

## 2014-07-07 ENCOUNTER — Telehealth: Payer: Self-pay | Admitting: Family Medicine

## 2014-07-07 DIAGNOSIS — E782 Mixed hyperlipidemia: Secondary | ICD-10-CM

## 2014-07-07 DIAGNOSIS — E039 Hypothyroidism, unspecified: Secondary | ICD-10-CM

## 2014-07-07 DIAGNOSIS — Z79899 Other long term (current) drug therapy: Secondary | ICD-10-CM

## 2014-07-07 NOTE — Telephone Encounter (Signed)
Blood work ordered in Epic. Patient notified. 

## 2014-07-07 NOTE — Telephone Encounter (Signed)
Patient needs order for blood work. °

## 2014-07-07 NOTE — Telephone Encounter (Signed)
Rep all same 

## 2014-07-07 NOTE — Telephone Encounter (Signed)
Patient had Lipid, Liver, Met 7 and TSH on 10/14

## 2014-08-06 DIAGNOSIS — M1711 Unilateral primary osteoarthritis, right knee: Secondary | ICD-10-CM | POA: Diagnosis not present

## 2014-09-09 ENCOUNTER — Encounter: Payer: BC Managed Care – PPO | Admitting: Nurse Practitioner

## 2014-09-16 ENCOUNTER — Ambulatory Visit (INDEPENDENT_AMBULATORY_CARE_PROVIDER_SITE_OTHER): Payer: Medicare Other | Admitting: Nurse Practitioner

## 2014-09-16 ENCOUNTER — Encounter: Payer: Self-pay | Admitting: Nurse Practitioner

## 2014-09-16 VITALS — BP 118/80 | Ht 63.0 in | Wt 281.0 lb

## 2014-09-16 DIAGNOSIS — Z124 Encounter for screening for malignant neoplasm of cervix: Secondary | ICD-10-CM | POA: Diagnosis not present

## 2014-09-16 DIAGNOSIS — Z23 Encounter for immunization: Secondary | ICD-10-CM

## 2014-09-16 DIAGNOSIS — Z0000 Encounter for general adult medical examination without abnormal findings: Secondary | ICD-10-CM | POA: Diagnosis not present

## 2014-09-16 DIAGNOSIS — Z01419 Encounter for gynecological examination (general) (routine) without abnormal findings: Secondary | ICD-10-CM

## 2014-09-16 MED ORDER — HYDROCHLOROTHIAZIDE 25 MG PO TABS: ORAL_TABLET | ORAL | Status: DC

## 2014-09-16 MED ORDER — LEVOTHYROXINE SODIUM 175 MCG PO TABS: ORAL_TABLET | ORAL | Status: DC

## 2014-09-16 MED ORDER — METOPROLOL SUCCINATE ER 50 MG PO TB24: ORAL_TABLET | ORAL | Status: DC

## 2014-09-16 MED ORDER — CELECOXIB 200 MG PO CAPS: ORAL_CAPSULE | ORAL | Status: DC

## 2014-09-18 LAB — PAP IG W/ RFLX HPV ASCU

## 2014-09-19 DIAGNOSIS — R0600 Dyspnea, unspecified: Secondary | ICD-10-CM | POA: Diagnosis not present

## 2014-09-19 DIAGNOSIS — J45901 Unspecified asthma with (acute) exacerbation: Secondary | ICD-10-CM | POA: Diagnosis not present

## 2014-09-19 DIAGNOSIS — J019 Acute sinusitis, unspecified: Secondary | ICD-10-CM | POA: Diagnosis not present

## 2014-09-19 DIAGNOSIS — J209 Acute bronchitis, unspecified: Secondary | ICD-10-CM | POA: Diagnosis not present

## 2014-09-20 ENCOUNTER — Encounter: Payer: Self-pay | Admitting: Nurse Practitioner

## 2014-09-20 NOTE — Progress Notes (Signed)
   Subjective:    Patient ID: Ashlee Solomon, female    DOB: 1951-04-09, 63 y.o.   MRN: [id number]  HPI presents for her wellness exam. Had shingles back in August. Would like to get Zostavax. Regular vision and dental exams. Married, same sexual partner. Active lifestyle.     Review of Systems  Constitutional: Negative for fever, activity change, appetite change and fatigue.  HENT: Negative for dental problem, ear pain, sinus pressure and sore throat.   Respiratory: Negative for cough, chest tightness, shortness of breath and wheezing.   Cardiovascular: Negative for chest pain.  Gastrointestinal: Negative for nausea, vomiting, abdominal pain, diarrhea, constipation, blood in stool and abdominal distention.  Genitourinary: Negative for dysuria, urgency, frequency, vaginal bleeding, vaginal discharge, enuresis, difficulty urinating, genital sores and pelvic pain.       Objective:   Physical Exam  Constitutional: She is oriented to person, place, and time. She appears well-developed. No distress.  HENT:  Right Ear: External ear normal.  Left Ear: External ear normal.  Mouth/Throat: Oropharynx is clear and moist.  Neck: Normal range of motion. Neck supple. No tracheal deviation present. No thyromegaly present.  Cardiovascular: Normal rate, regular rhythm and normal heart sounds.  Exam reveals no gallop.   No murmur heard. Pulmonary/Chest: Effort normal and breath sounds normal.  Abdominal: Soft. She exhibits no distension. There is no tenderness.  Genitourinary: Vagina normal and uterus normal. No vaginal discharge found.  External GU: no rashes or lesions. Vagina: pale, no discharge. Bimanual exam: no tenderness or obvious masses. Ovaries nonpalpable but exam limited due to abd girth. Rectal exam: no masses; no stool for hemoccult.   Musculoskeletal: She exhibits no edema.  Lymphadenopathy:    She has no cervical adenopathy.  Neurological: She is alert and oriented to person, place,  and time.  Skin: Skin is warm and dry. No rash noted.  Psychiatric: She has a normal mood and affect. Her behavior is normal.  Vitals reviewed. Breast exam: no masses; axillae no adenopathy.        Assessment & Plan:  Well woman exam  Screening for cervical cancer - Plan: Pap IG w/ reflex to HPV when ASC-U  Need for vaccination - Plan: Flu Vaccine QUAD 36+ mos IM, Pneumococcal polysaccharide vaccine 23-valent greater than or equal to 2yo subcutaneous/IM  Given Rx for Zostavax; instructed to wait at least 30 days from today. Recommend healthy diet, regular activity, weight loss and daily vitamin D/calcium supplementation. Has lab papers. Plans to schedule her own colonoscopy and mammogram.  Return in about 1 year (around 09/17/2015).

## 2014-09-23 ENCOUNTER — Other Ambulatory Visit: Payer: Self-pay | Admitting: Family Medicine

## 2014-09-23 DIAGNOSIS — Z1231 Encounter for screening mammogram for malignant neoplasm of breast: Secondary | ICD-10-CM

## 2014-09-30 ENCOUNTER — Ambulatory Visit (HOSPITAL_COMMUNITY)
Admission: RE | Admit: 2014-09-30 | Discharge: 2014-09-30 | Disposition: A | Discharge status: Home / Self Care | Payer: Medicare Other | Source: Ambulatory Visit | Attending: Family Medicine | Admitting: Family Medicine

## 2014-09-30 DIAGNOSIS — Z79899 Other long term (current) drug therapy: Secondary | ICD-10-CM | POA: Diagnosis not present

## 2014-09-30 DIAGNOSIS — E782 Mixed hyperlipidemia: Secondary | ICD-10-CM | POA: Diagnosis not present

## 2014-09-30 DIAGNOSIS — Z1231 Encounter for screening mammogram for malignant neoplasm of breast: Secondary | ICD-10-CM

## 2014-09-30 DIAGNOSIS — E039 Hypothyroidism, unspecified: Secondary | ICD-10-CM | POA: Diagnosis not present

## 2014-10-01 ENCOUNTER — Encounter: Payer: Self-pay | Admitting: Family Medicine

## 2014-10-01 LAB — LIPID PANEL
CHOL/HDL RATIO: 2.2 ratio
CHOLESTEROL: 155 mg/dL (ref 0–200)
HDL: 69 mg/dL (ref >39)
LDL Cholesterol: 71 mg/dL (ref 0–99)
TRIGLYCERIDES: 77 mg/dL (ref <150)
VLDL: 15 mg/dL (ref 0–40)

## 2014-10-01 LAB — BASIC METABOLIC PANEL
BUN: 22 mg/dL (ref 6–23)
CALCIUM: 9.7 mg/dL (ref 8.4–10.5)
CO2: 29 mEq/L (ref 19–32)
Chloride: 100 mEq/L (ref 96–112)
Creat: 0.98 mg/dL (ref 0.50–1.10)
GLUCOSE: 96 mg/dL (ref 70–99)
Potassium: 4.2 mEq/L (ref 3.5–5.3)
SODIUM: 135 meq/L (ref 135–145)

## 2014-10-01 LAB — HEPATIC FUNCTION PANEL
ALT: 9 U/L (ref 0–35)
AST: 20 U/L (ref 0–37)
Albumin: 4.3 g/dL (ref 3.5–5.2)
Alkaline Phosphatase: 73 U/L (ref 39–117)
BILIRUBIN DIRECT: 0.1 mg/dL (ref 0.0–0.3)
BILIRUBIN INDIRECT: 0.4 mg/dL (ref 0.2–1.2)
TOTAL PROTEIN: 7.1 g/dL (ref 6.0–8.3)
Total Bilirubin: 0.5 mg/dL (ref 0.2–1.2)

## 2014-10-01 LAB — TSH: TSH: 0.509 u[IU]/mL (ref 0.350–4.500)

## 2014-10-07 ENCOUNTER — Ambulatory Visit (HOSPITAL_COMMUNITY): Payer: Medicare Other

## 2014-10-28 ENCOUNTER — Ambulatory Visit (HOSPITAL_COMMUNITY): Payer: Medicare Other

## 2014-11-04 ENCOUNTER — Ambulatory Visit (HOSPITAL_COMMUNITY): Payer: Medicare Other

## 2014-11-04 ENCOUNTER — Ambulatory Visit (HOSPITAL_COMMUNITY)
Admission: RE | Admit: 2014-11-04 | Discharge: 2014-11-04 | Disposition: A | Discharge status: Home / Self Care | Payer: Medicare Other | Source: Ambulatory Visit | Attending: Family Medicine | Admitting: Family Medicine

## 2014-11-04 DIAGNOSIS — Z1231 Encounter for screening mammogram for malignant neoplasm of breast: Secondary | ICD-10-CM | POA: Insufficient documentation

## 2014-12-30 ENCOUNTER — Other Ambulatory Visit (INDEPENDENT_AMBULATORY_CARE_PROVIDER_SITE_OTHER): Payer: Self-pay | Admitting: *Deleted

## 2014-12-30 DIAGNOSIS — Z1211 Encounter for screening for malignant neoplasm of colon: Secondary | ICD-10-CM

## 2014-12-30 DIAGNOSIS — Z800 Family history of malignant neoplasm of digestive organs: Secondary | ICD-10-CM

## 2015-02-19 ENCOUNTER — Telehealth (INDEPENDENT_AMBULATORY_CARE_PROVIDER_SITE_OTHER): Payer: Self-pay | Admitting: *Deleted

## 2015-02-19 DIAGNOSIS — Z1211 Encounter for screening for malignant neoplasm of colon: Secondary | ICD-10-CM

## 2015-02-19 MED ORDER — PEG 3350-KCL-NA BICARB-NACL 420 G PO SOLR: 4000.0000 mL | Freq: Once | ORAL | Status: DC

## 2015-02-19 NOTE — Telephone Encounter (Signed)
Patient needs trilyte 

## 2015-02-22 ENCOUNTER — Encounter (INDEPENDENT_AMBULATORY_CARE_PROVIDER_SITE_OTHER): Payer: Self-pay | Admitting: *Deleted

## 2015-03-04 DIAGNOSIS — R05 Cough: Secondary | ICD-10-CM | POA: Diagnosis not present

## 2015-03-04 DIAGNOSIS — J45909 Unspecified asthma, uncomplicated: Secondary | ICD-10-CM | POA: Diagnosis not present

## 2015-03-04 DIAGNOSIS — J180 Bronchopneumonia, unspecified organism: Secondary | ICD-10-CM | POA: Diagnosis not present

## 2015-03-04 DIAGNOSIS — J9801 Acute bronchospasm: Secondary | ICD-10-CM | POA: Diagnosis not present

## 2015-03-15 ENCOUNTER — Telehealth (INDEPENDENT_AMBULATORY_CARE_PROVIDER_SITE_OTHER): Payer: Self-pay | Admitting: *Deleted

## 2015-03-15 NOTE — Telephone Encounter (Signed)
Referring MD/PCP: steve luking   Procedure: tcs  Reason/Indication:  Screening, fam hx colon ca  Has patient had this procedure before?  Yes, 10 yrs ago  If so, when, by whom and where?    Is there a family history of colon cancer?  Yes, father  Who?  What age when diagnosed?    Is patient diabetic?   no      Does patient have prosthetic heart valve?  no  Do you have a pacemaker?  no  Has patient ever had endocarditis? no  Has patient had joint replacement within last 12 months?  no  Does patient tend to be constipated or take laxatives? no  Is patient on Coumadin, Plavix and/or Aspirin? no  Medications: synthroid 175 mcg daily, metoprolol 50 mg daily, hctz 25 mg daily, celebrex 200 mg daily, claritin daily, otc potassium daily, vit b complex, vit c, co q 10 daily, butchers broom, magnesium   Allergies: nkda  Medication Adjustment:   Procedure date & time: 04/14/15 at 1030

## 2015-03-18 NOTE — Telephone Encounter (Signed)
agree

## 2015-03-31 DIAGNOSIS — M1711 Unilateral primary osteoarthritis, right knee: Secondary | ICD-10-CM | POA: Diagnosis not present

## 2015-04-14 ENCOUNTER — Encounter (HOSPITAL_COMMUNITY): Payer: Self-pay | Admitting: Emergency Medicine

## 2015-04-14 ENCOUNTER — Ambulatory Visit (HOSPITAL_COMMUNITY)
Admission: RE | Admit: 2015-04-14 | Discharge: 2015-04-14 | Disposition: A | Discharge status: Home / Self Care | Payer: Medicare Other | Source: Ambulatory Visit | Attending: Internal Medicine | Admitting: Internal Medicine

## 2015-04-14 ENCOUNTER — Encounter (HOSPITAL_COMMUNITY)
Admission: RE | Disposition: A | Discharge status: Home / Self Care | Payer: Self-pay | Source: Ambulatory Visit | Attending: Internal Medicine

## 2015-04-14 DIAGNOSIS — K648 Other hemorrhoids: Secondary | ICD-10-CM | POA: Diagnosis not present

## 2015-04-14 DIAGNOSIS — J45909 Unspecified asthma, uncomplicated: Secondary | ICD-10-CM | POA: Insufficient documentation

## 2015-04-14 DIAGNOSIS — Z800 Family history of malignant neoplasm of digestive organs: Secondary | ICD-10-CM | POA: Diagnosis not present

## 2015-04-14 DIAGNOSIS — K5730 Diverticulosis of large intestine without perforation or abscess without bleeding: Secondary | ICD-10-CM | POA: Insufficient documentation

## 2015-04-14 DIAGNOSIS — Z1211 Encounter for screening for malignant neoplasm of colon: Secondary | ICD-10-CM

## 2015-04-14 DIAGNOSIS — K649 Unspecified hemorrhoids: Secondary | ICD-10-CM | POA: Diagnosis not present

## 2015-04-14 DIAGNOSIS — I10 Essential (primary) hypertension: Secondary | ICD-10-CM | POA: Insufficient documentation

## 2015-04-14 DIAGNOSIS — E039 Hypothyroidism, unspecified: Secondary | ICD-10-CM | POA: Diagnosis not present

## 2015-04-14 DIAGNOSIS — Z791 Long term (current) use of non-steroidal anti-inflammatories (NSAID): Secondary | ICD-10-CM | POA: Diagnosis not present

## 2015-04-14 DIAGNOSIS — Z87891 Personal history of nicotine dependence: Secondary | ICD-10-CM | POA: Diagnosis not present

## 2015-04-14 DIAGNOSIS — Z96652 Presence of left artificial knee joint: Secondary | ICD-10-CM | POA: Insufficient documentation

## 2015-04-14 DIAGNOSIS — K219 Gastro-esophageal reflux disease without esophagitis: Secondary | ICD-10-CM | POA: Diagnosis not present

## 2015-04-14 DIAGNOSIS — M1990 Unspecified osteoarthritis, unspecified site: Secondary | ICD-10-CM | POA: Diagnosis not present

## 2015-04-14 DIAGNOSIS — Z79899 Other long term (current) drug therapy: Secondary | ICD-10-CM | POA: Insufficient documentation

## 2015-04-14 DIAGNOSIS — K921 Melena: Secondary | ICD-10-CM | POA: Diagnosis present

## 2015-04-14 HISTORY — PX: COLONOSCOPY: SHX5424

## 2015-04-14 SURGERY — COLONOSCOPY
Anesthesia: Moderate Sedation

## 2015-04-14 MED ORDER — MEPERIDINE HCL 50 MG/ML IJ SOLN
INTRAMUSCULAR | Status: DC
Start: 2015-04-14 — End: 2015-04-14
  Filled 2015-04-14: qty 1

## 2015-04-14 MED ORDER — MEPERIDINE HCL 50 MG/ML IJ SOLN
INTRAMUSCULAR | Status: DC | PRN
  Administered 2015-04-14 (×2): 25 mg via INTRAVENOUS

## 2015-04-14 MED ORDER — MIDAZOLAM HCL 5 MG/5ML IJ SOLN
INTRAMUSCULAR | Status: AC
  Filled 2015-04-14: qty 10

## 2015-04-14 MED ORDER — SODIUM CHLORIDE 0.9 % IV SOLN
INTRAVENOUS | Status: DC
  Administered 2015-04-14: 20 mL/h via INTRAVENOUS

## 2015-04-14 MED ORDER — MIDAZOLAM HCL 5 MG/5ML IJ SOLN
INTRAMUSCULAR | Status: DC | PRN
  Administered 2015-04-14 (×2): 2 mg via INTRAVENOUS
  Administered 2015-04-14: 1 mg via INTRAVENOUS
  Administered 2015-04-14: 2 mg via INTRAVENOUS
  Administered 2015-04-14: 3 mg via INTRAVENOUS

## 2015-04-14 NOTE — H&P (Signed)
Ashlee Solomon is an 64 y.o. female.   Chief Complaint: Patient's here for colonoscopy. HPI: Patient 64 year old Caucasian female who is here for screening colonoscopy. Last exam was 10 years ago and was normal. She denies abdominal pain or change in bowel habits. She has intermittent hematochezia felt to be secondary to hemorrhoids. She denies frank rectal bleeding. Family history is positive for CRC and father who was in his 72s at the time of diagnosis and died within 15 months.  Past Medical History  Diagnosis Date  . Hypertension     clearance Dr Wolfgang Phoenix with note on chart  . Hypothyroidism   . Reactive airway disease   . Pneumonia 1/13  . GERD (gastroesophageal reflux disease)   . Arthritis   . DDD (degenerative disc disease)     Past Surgical History  Procedure Laterality Date  . Appendectomy    . Tonsillectomy    . Cesarean section      x 3  . Total knee arthroplasty  06/03/2012    Procedure: TOTAL KNEE ARTHROPLASTY;  Surgeon: Gearlean Alf, MD;  Location: [facility];  Service: Orthopedics;  Laterality: Left;    History reviewed. No pertinent family history. Social History:  reports that she quit smoking about 7 years ago. Her smoking use included Cigarettes. She smoked 0.25 packs per day. She has never used smokeless tobacco. She reports that she does not use illicit drugs. Her alcohol history is not on file.  Allergies: No Known Allergies  Medications Prior to Admission  Medication Sig Dispense Refill  . albuterol (PROVENTIL HFA;VENTOLIN HFA) 108 (90 BASE) MCG/ACT inhaler Inhale 2 puffs into the lungs every 6 (six) hours as needed. For shortness of breath    . celecoxib (CELEBREX) 200 MG capsule TAKE 1 CAPSULE DAILY AS NEEDED 30 capsule 5  . hydrochlorothiazide (HYDRODIURIL) 25 MG tablet TAKE 1 TABLET EVERY MORNING 30 tablet 5  . levothyroxine (SYNTHROID, LEVOTHROID) 175 MCG tablet TAKE 1 TABLET EVERY MORNING 30 tablet 5  . loratadine (CLARITIN) 10 MG tablet Take 10 mg by  mouth daily with breakfast.     . magnesium oxide (MAG-OX) 400 MG tablet Take 400 mg by mouth at bedtime.     . metoprolol succinate (TOPROL-XL) 50 MG 24 hr tablet TAKE 1 TABLET DAILY 30 tablet 5  . polyethylene glycol-electrolytes (NULYTELY/GOLYTELY) 420 G solution Take 4,000 mLs by mouth once. 4000 mL 0  . Potassium 95 MG TABS Take by mouth. OTC- pt will verify dosage  05/24/12      No results found for this or any previous visit (from the past 48 hour(s)). No results found.  ROS  Blood pressure 122/74, pulse 85, temperature 97.7 F (36.5 C), temperature source Oral, resp. rate 14, weight 281 lb (127.461 kg), SpO2 96 %. Physical Exam  Constitutional:  Well-developed obese Caucasian female in NAD.  HENT:  Mouth/Throat: Oropharynx is clear and moist.  Eyes: Conjunctivae are normal. No scleral icterus.  Neck: No thyromegaly present.  Cardiovascular: Normal rate, regular rhythm and normal heart sounds.   No murmur heard. Respiratory: Effort normal and breath sounds normal.  GI: Soft. She exhibits no distension and no mass. There is no tenderness.  Lower midline scar  Musculoskeletal: She exhibits no edema.  Lymphadenopathy:    She has no cervical adenopathy.  Neurological: She is alert.  Skin: Skin is warm and dry.     Assessment/Plan Average risk screening colonoscopy. Family history of CRC in father at late onset.  [name] U  04/14/2015, 12:14 PM

## 2015-04-14 NOTE — Discharge Instructions (Signed)
Resume usual medications and high fiber diet. °No driving for 24 hours. °Next screening exam in 10 years. ° °Colonoscopy, Care After °These instructions give you information on caring for yourself after your procedure. Your doctor may also give you more specific instructions. Call your doctor if you have any problems or questions after your procedure. °HOME CARE °· Do not drive for 24 hours. °· Do not sign important papers or use machinery for 24 hours. °· You may shower. °· You may go back to your usual activities, but go slower for the first 24 hours. °· Take rest breaks often during the first 24 hours. °· Walk around or use warm packs on your belly (abdomen) if you have belly cramping or gas. °· Drink enough fluids to keep your pee (urine) clear or pale yellow. °· Resume your normal diet. Avoid heavy or fried foods. °· Avoid drinking alcohol for 24 hours or as told by your doctor. °· Only take medicines as told by your doctor. °If a tissue sample (biopsy) was taken during the procedure:  °· Do not take aspirin or blood thinners for 7 days, or as told by your doctor. °· Do not drink alcohol for 7 days, or as told by your doctor. °· Eat soft foods for the first 24 hours. °GET HELP IF: °You still have a small amount of blood in your poop (stool) 2-3 days after the procedure. °GET HELP RIGHT AWAY IF: °· You have more than a small amount of blood in your poop. °· You see clumps of tissue (blood clots) in your poop. °· Your belly is puffy (swollen). °· You feel sick to your stomach (nauseous) or throw up (vomit). °· You have a fever. °· You have belly pain that gets worse and medicine does not help. °MAKE SURE YOU: °· Understand these instructions. °· Will watch your condition. °· Will get help right away if you are not doing well or get worse. °Document Released: 11/11/2010 Document Revised: 10/14/2013 Document Reviewed: 06/16/2013 °Exit[employer]® Patient Information ©2015 [employer]. This information is not intended to  replace advice given to you by your health care provider. Make sure you discuss any questions you have with your health care provider. °High-Fiber Diet °Fiber is found in fruits, vegetables, and grains. A high-fiber diet encourages the addition of more whole grains, legumes, fruits, and vegetables in your diet. The recommended amount of fiber for adult males is 38 g per day. For adult females, it is 25 g per day. Pregnant and lactating women should get 28 g of fiber per day. If you have a digestive or bowel problem, ask your caregiver for advice before adding high-fiber foods to your diet. Eat a variety of high-fiber foods instead of only a select few type of foods.  °PURPOSE °· To increase stool bulk. °· To make bowel movements more regular to prevent constipation. °· To lower cholesterol. °· To prevent overeating. °WHEN IS THIS DIET USED? °· It may be used if you have constipation and hemorrhoids. °· It may be used if you have uncomplicated diverticulosis (intestine condition) and irritable bowel syndrome. °· It may be used if you need help with weight management. °· It may be used if you want to add it to your diet as a protective measure against atherosclerosis, diabetes, and cancer. °SOURCES OF FIBER °· Whole-grain breads and cereals. °· Fruits, such as apples, oranges, bananas, berries, prunes, and pears. °· Vegetables, such as green peas, carrots, sweet potatoes, beets, broccoli, cabbage, spinach, and   artichokes. °· Legumes, such split peas, soy, lentils. °· Almonds. °FIBER CONTENT IN FOODS °Starches and Grains / Dietary Fiber (g) °· Cheerios, 1 cup / 3 g °· Corn Flakes cereal, 1 cup / 0.7 g °· Rice crispy treat cereal, 1¼ cup / 0.3 g °· Instant oatmeal (cooked), ½ cup / 2 g °· Frosted wheat cereal, 1 cup / 5.1 g °· Brown, long-grain rice (cooked), 1 cup / 3.5 g °· White, long-grain rice (cooked), 1 cup / 0.6 g °· Enriched macaroni (cooked), 1 cup / 2.5 g °Legumes / Dietary Fiber (g) °· Baked beans (canned,  plain, or vegetarian), ½ cup / 5.2 g °· Kidney beans (canned), ½ cup / 6.8 g °· Pinto beans (cooked), ½ cup / 5.5 g °Breads and Crackers / Dietary Fiber (g) °· Plain or honey graham crackers, 2 squares / 0.7 g °· Saltine crackers, 3 squares / 0.3 g °· Plain, salted pretzels, 10 pieces / 1.8 g °· Whole-wheat bread, 1 slice / 1.9 g °· White bread, 1 slice / 0.7 g °· Raisin bread, 1 slice / 1.2 g °· Plain bagel, 3 oz / 2 g °· Flour tortilla, 1 oz / 0.9 g °· Corn tortilla, 1 small / 1.5 g °· Hamburger or hotdog bun, 1 small / 0.9 g °Fruits / Dietary Fiber (g) °· Apple with skin, 1 medium / 4.4 g °· Sweetened applesauce, ½ cup / 1.5 g °· Banana, ½ medium / 1.5 g °· Grapes, 10 grapes / 0.4 g °· Orange, 1 small / 2.3 g °· Raisin, 1.5 oz / 1.6 g °· Melon, 1 cup / 1.4 g °Vegetables / Dietary Fiber (g) °· Green beans (canned), ½ cup / 1.3 g °· Carrots (cooked), ½ cup / 2.3 g °· Broccoli (cooked), ½ cup / 2.8 g °· Peas (cooked), ½ cup / 4.4 g °· Mashed potatoes, ½ cup / 1.6 g °· Lettuce, 1 cup / 0.5 g °· Corn (canned), ½ cup / 1.6 g °· Tomato, ½ cup / 1.1 g °Document Released: 10/09/2005 Document Revised: 04/09/2012 Document Reviewed: 01/11/2012 °Exit[employer]® Patient Information ©2015 [employer]. This information is not intended to replace advice given to you by your health care provider. Make sure you discuss any questions you have with your health care provider. ° °

## 2015-04-14 NOTE — Op Note (Signed)
COLONOSCOPY PROCEDURE REPORT  PATIENT:  Ashlee Solomon  MR#:  [id number] Birthdate:  26-Nov-1950, 64 y.o., female Endoscopist:  Dr. Rogene Houston, MD Referred By:  Dr. Rosette Reveal looking, MD  Procedure Date: 04/14/2015  Procedure:   Colonoscopy  Indications: Patient is 64 year old Caucasian female was undergoing average risk screening colonoscopy. Last exam was 10 years ago. Family history is significant for CRC and father was in his 56s.   Informed Consent:  The procedure and risks were reviewed with the patient and informed consent was obtained.  Medications:  Demerol 50 mg IV Versed 10 mg IV  Description of procedure:  After a digital rectal exam was performed, that colonoscope was advanced from the anus through the rectum and colon to the area of the cecum, ileocecal valve and appendiceal orifice. The cecum was deeply intubated. These structures were well-seen and photographed for the record. From the level of the cecum and ileocecal valve, the scope was slowly and cautiously withdrawn. The mucosal surfaces were carefully surveyed utilizing scope tip to flexion to facilitate fold flattening as needed. The scope was pulled down into the rectum where a thorough exam including retroflexion was performed.  Findings:   Prep excellent. Few small diverticula noted at transverse, descending and sigmoid colon. Normal rectal mucosa. Hemorrhoids below the dentate line.   Therapeutic/Diagnostic Maneuvers Performed:   None  Complications:  None  Cecal Withdrawal Time:  9 minutes  Impression:  Examination performed to cecum. No evidence of colonic polyps. Few scattered diverticula at transverse descending and sigmoid colon. Normal rectal mucosa. Small hemorrhoids below the dentate line.  Recommendations:  Standard instructions given. High fiber diet. Next screening exam in 10 years.  REHMAN,NAJEEB U  04/14/2015 1:02 PM  CC: Dr. Mickie Hillier, MD & Dr. Rayne Du ref. provider  found

## 2015-04-15 ENCOUNTER — Other Ambulatory Visit: Payer: Self-pay | Admitting: Nurse Practitioner

## 2015-04-19 ENCOUNTER — Encounter (HOSPITAL_COMMUNITY): Payer: Self-pay | Admitting: Internal Medicine

## 2015-05-15 ENCOUNTER — Other Ambulatory Visit: Payer: Self-pay | Admitting: Nurse Practitioner

## 2015-05-26 ENCOUNTER — Other Ambulatory Visit: Payer: Self-pay | Admitting: Nurse Practitioner

## 2015-07-09 DIAGNOSIS — M25561 Pain in right knee: Secondary | ICD-10-CM | POA: Diagnosis not present

## 2015-07-09 DIAGNOSIS — M1711 Unilateral primary osteoarthritis, right knee: Secondary | ICD-10-CM | POA: Diagnosis not present

## 2015-08-10 ENCOUNTER — Telehealth: Payer: Self-pay | Admitting: Family Medicine

## 2015-08-10 DIAGNOSIS — Z13220 Encounter for screening for lipoid disorders: Secondary | ICD-10-CM

## 2015-08-10 DIAGNOSIS — Z139 Encounter for screening, unspecified: Secondary | ICD-10-CM

## 2015-08-10 DIAGNOSIS — Z79899 Other long term (current) drug therapy: Secondary | ICD-10-CM

## 2015-08-10 DIAGNOSIS — D649 Anemia, unspecified: Secondary | ICD-10-CM

## 2015-08-10 DIAGNOSIS — E871 Hypo-osmolality and hyponatremia: Secondary | ICD-10-CM

## 2015-08-10 NOTE — Telephone Encounter (Signed)
[facility]  08/10/15

## 2015-08-10 NOTE — Telephone Encounter (Signed)
Pt is requesting lab orders to be sent over for an upcoming wellness visit.  Last labs per epic were: lipid,hepatic,bmp,and tsh on 09/30/14

## 2015-08-10 NOTE — Telephone Encounter (Signed)
Rep same 

## 2015-08-11 NOTE — Telephone Encounter (Signed)
Patient notified

## 2015-08-18 ENCOUNTER — Other Ambulatory Visit: Payer: Self-pay | Admitting: Nurse Practitioner

## 2015-08-18 NOTE — Telephone Encounter (Signed)
30 d only ea, hopefully well exam w car by then, ntbs every six mo with thses meds

## 2015-08-18 NOTE — Telephone Encounter (Signed)
Last seen November 2015. May I refill?

## 2015-08-24 DIAGNOSIS — Z139 Encounter for screening, unspecified: Secondary | ICD-10-CM | POA: Diagnosis not present

## 2015-08-24 DIAGNOSIS — Z13220 Encounter for screening for lipoid disorders: Secondary | ICD-10-CM | POA: Diagnosis not present

## 2015-08-24 DIAGNOSIS — Z79899 Other long term (current) drug therapy: Secondary | ICD-10-CM | POA: Diagnosis not present

## 2015-08-24 DIAGNOSIS — E871 Hypo-osmolality and hyponatremia: Secondary | ICD-10-CM | POA: Diagnosis not present

## 2015-08-25 LAB — HEPATIC FUNCTION PANEL
ALK PHOS: 71 IU/L (ref 39–117)
ALT: 9 IU/L (ref 0–32)
AST: 18 IU/L (ref 0–40)
Albumin: 4.2 g/dL (ref 3.6–4.8)
BILIRUBIN, DIRECT: 0.11 mg/dL (ref 0.00–0.40)
Bilirubin Total: 0.3 mg/dL (ref 0.0–1.2)
Total Protein: 6.5 g/dL (ref 6.0–8.5)

## 2015-08-25 LAB — BASIC METABOLIC PANEL
BUN/Creatinine Ratio: 27 — ABNORMAL HIGH (ref 11–26)
BUN: 21 mg/dL (ref 8–27)
CALCIUM: 9.3 mg/dL (ref 8.7–10.3)
CO2: 27 mmol/L (ref 18–29)
Chloride: 99 mmol/L (ref 97–106)
Creatinine, Ser: 0.78 mg/dL (ref 0.57–1.00)
GFR, EST AFRICAN AMERICAN: 92 mL/min/{1.73_m2} (ref >59)
GFR, EST NON AFRICAN AMERICAN: 79 mL/min/{1.73_m2} (ref >59)
Glucose: 89 mg/dL (ref 65–99)
Potassium: 4.1 mmol/L (ref 3.5–5.2)
Sodium: 140 mmol/L (ref 136–144)

## 2015-08-25 LAB — LIPID PANEL
CHOLESTEROL TOTAL: 160 mg/dL (ref 100–199)
Chol/HDL Ratio: 2.2 ratio units (ref 0.0–4.4)
HDL: 72 mg/dL (ref >39)
LDL Calculated: 69 mg/dL (ref 0–99)
TRIGLYCERIDES: 95 mg/dL (ref 0–149)
VLDL Cholesterol Cal: 19 mg/dL (ref 5–40)

## 2015-08-25 LAB — TSH: TSH: 0.51 u[IU]/mL (ref 0.450–4.500)

## 2015-08-30 ENCOUNTER — Encounter: Payer: Self-pay | Admitting: Nurse Practitioner

## 2015-08-30 ENCOUNTER — Ambulatory Visit (INDEPENDENT_AMBULATORY_CARE_PROVIDER_SITE_OTHER): Payer: Medicare Other | Admitting: Nurse Practitioner

## 2015-08-30 VITALS — BP 124/80 | Ht 63.0 in | Wt 293.0 lb

## 2015-08-30 DIAGNOSIS — Z780 Asymptomatic menopausal state: Secondary | ICD-10-CM

## 2015-08-30 DIAGNOSIS — E039 Hypothyroidism, unspecified: Secondary | ICD-10-CM | POA: Diagnosis not present

## 2015-08-30 DIAGNOSIS — R600 Localized edema: Secondary | ICD-10-CM | POA: Insufficient documentation

## 2015-08-30 DIAGNOSIS — R609 Edema, unspecified: Secondary | ICD-10-CM | POA: Insufficient documentation

## 2015-08-30 DIAGNOSIS — Z23 Encounter for immunization: Secondary | ICD-10-CM

## 2015-08-30 MED ORDER — POTASSIUM CHLORIDE CRYS ER 10 MEQ PO TBCR: 10.0000 meq | EXTENDED_RELEASE_TABLET | Freq: Every day | ORAL | Status: DC

## 2015-08-30 MED ORDER — FUROSEMIDE 20 MG PO TABS: 20.0000 mg | ORAL_TABLET | Freq: Every day | ORAL | Status: DC

## 2015-08-30 MED ORDER — NALTREXONE-BUPROPION HCL ER 8-90 MG PO TB12: ORAL_TABLET | ORAL | Status: DC

## 2015-08-30 MED ORDER — ALBUTEROL SULFATE HFA 108 (90 BASE) MCG/ACT IN AERS: 2.0000 | INHALATION_SPRAY | Freq: Four times a day (QID) | RESPIRATORY_TRACT | Status: DC | PRN

## 2015-08-30 NOTE — Progress Notes (Signed)
Subjective:  Presents for routine follow-up. Originally patient presented for her wellness physical but it has not been a calendar year since her last physical. Has been gaining weight. Stays active. States she does not really understand why she continues to gain. With recent weight gain, she has noticed increase fluid and swelling in the lower legs, more than usual. Not responding to HCTZ. Mild shortness of breath. No chest pain. No orthopnea. Takes 5000 units vitamin D daily. Gets regular vision and dental exams. Same sexual partner. No pelvic pain or discharge.  Objective:   BP 124/80 mmHg  Ht 5\' 3"  (1.6 m)  Wt 293 lb (132.904 kg)  BMI 51.92 kg/m2 NAD. Alert, oriented. TMs normal limit. Pharynx clear. Neck supple with minimal adenopathy. Thyroid normal limit to palpation, no masses or goiter, nontender. Lungs clear. Heart regular rate rhythm. Abdomen soft nontender. Breast exam no masses noted, axilla no adenopathy. Lower extremities 2+ pitting edema. Reviewed labs with patient dated 08/24/2015  Assessment:  Problem List Items Addressed This Visit      Endocrine   Hypothyroidism     Other   Morbid obesity ([place])   Relevant Medications   Naltrexone-Bupropion HCl ER 8-90 MG TB12   Naltrexone-Bupropion HCl ER 8-90 MG TB12   Peripheral edema - Primary    Other Visit Diagnoses    Post-menopause        Relevant Orders    DG Bone Density    Encounter for immunization          Plan:  Meds ordered this encounter  Medications  . albuterol (PROVENTIL HFA;VENTOLIN HFA) 108 (90 BASE) MCG/ACT inhaler    Sig: Inhale 2 puffs into the lungs every 6 (six) hours as needed. For shortness of breath    Dispense:  1 Inhaler    Refill:  5    Order Specific Question:  Supervising Provider    Answer:  Mikey Kirschner [[id number]]  . furosemide (LASIX) 20 MG tablet    Sig: Take 1 tablet (20 mg total) by mouth daily.    Dispense:  90 tablet    Refill:  1    Order Specific Question:  Supervising Provider     Answer:  Mikey Kirschner [[id number]]  . potassium chloride (K-DUR,KLOR-CON) 10 MEQ tablet    Sig: Take 1 tablet (10 mEq total) by mouth daily.    Dispense:  90 tablet    Refill:  1    Order Specific Question:  Supervising Provider    Answer:  Mikey Kirschner [[id number]]  . Naltrexone-Bupropion HCl ER 8-90 MG TB12    Sig: One po qam x 1 week then one po BID x 1 week then 2 po qam and one po qpm x 1 week then 2 po BID    Dispense:  90 tablet    Refill:  0    Order Specific Question:  Supervising Provider    Answer:  Mikey Kirschner [[id number]]  . Naltrexone-Bupropion HCl ER 8-90 MG TB12    Sig: 2 po BID; start after first prescription    Dispense:  120 tablet    Refill:  1    Order Specific Question:  Supervising Provider    Answer:  Mikey Kirschner [[id number]]   Stop HCTZ. Switch to Lasix 20 mg daily, take potassium every day she takes Lasix. Discussed options regarding weight loss. Trial of Contrave if she can afford it. Also given information [facility] weight loss clinics. Given prescription for Zostavax. Schedule DEXA  scan. Return in about 6 months (around 02/27/2016) for recheck.

## 2015-09-06 ENCOUNTER — Other Ambulatory Visit (HOSPITAL_COMMUNITY): Payer: Medicare Other

## 2015-09-07 ENCOUNTER — Ambulatory Visit (HOSPITAL_COMMUNITY)
Admission: RE | Admit: 2015-09-07 | Discharge: 2015-09-07 | Disposition: A | Discharge status: Home / Self Care | Payer: Medicare Other | Source: Ambulatory Visit | Attending: Nurse Practitioner | Admitting: Nurse Practitioner

## 2015-09-07 DIAGNOSIS — Z780 Asymptomatic menopausal state: Secondary | ICD-10-CM | POA: Diagnosis not present

## 2015-09-07 DIAGNOSIS — Z13820 Encounter for screening for osteoporosis: Secondary | ICD-10-CM | POA: Diagnosis not present

## 2015-09-15 ENCOUNTER — Other Ambulatory Visit: Payer: Self-pay | Admitting: Family Medicine

## 2015-09-22 DIAGNOSIS — J209 Acute bronchitis, unspecified: Secondary | ICD-10-CM | POA: Diagnosis not present

## 2015-09-22 DIAGNOSIS — R509 Fever, unspecified: Secondary | ICD-10-CM | POA: Diagnosis not present

## 2015-09-22 DIAGNOSIS — R05 Cough: Secondary | ICD-10-CM | POA: Diagnosis not present

## 2015-09-22 DIAGNOSIS — J019 Acute sinusitis, unspecified: Secondary | ICD-10-CM | POA: Diagnosis not present

## 2015-09-22 DIAGNOSIS — J9801 Acute bronchospasm: Secondary | ICD-10-CM | POA: Diagnosis not present

## 2015-09-22 DIAGNOSIS — J029 Acute pharyngitis, unspecified: Secondary | ICD-10-CM | POA: Diagnosis not present

## 2015-10-07 DIAGNOSIS — M1711 Unilateral primary osteoarthritis, right knee: Secondary | ICD-10-CM | POA: Diagnosis not present

## 2015-10-15 ENCOUNTER — Other Ambulatory Visit: Payer: Self-pay | Admitting: Family Medicine

## 2015-10-15 NOTE — Telephone Encounter (Signed)
May we refill the Celebrex?

## 2015-10-15 NOTE — Telephone Encounter (Signed)
Ok six mo each

## 2015-11-15 ENCOUNTER — Other Ambulatory Visit: Payer: Self-pay | Admitting: Nurse Practitioner

## 2016-02-07 ENCOUNTER — Telehealth: Payer: Self-pay | Admitting: Nurse Practitioner

## 2016-02-07 NOTE — Telephone Encounter (Signed)
Pt is requesting lab orders to be sent over for an upcoming appt Last labs per epic were: hepatic,lipid,bmp,and tsh on 08/24/15.

## 2016-02-10 ENCOUNTER — Other Ambulatory Visit: Payer: Self-pay | Admitting: Nurse Practitioner

## 2016-02-10 DIAGNOSIS — R609 Edema, unspecified: Secondary | ICD-10-CM

## 2016-02-10 DIAGNOSIS — E039 Hypothyroidism, unspecified: Secondary | ICD-10-CM

## 2016-02-10 DIAGNOSIS — D649 Anemia, unspecified: Secondary | ICD-10-CM

## 2016-02-10 NOTE — Telephone Encounter (Signed)
done

## 2016-02-16 DIAGNOSIS — M1711 Unilateral primary osteoarthritis, right knee: Secondary | ICD-10-CM | POA: Diagnosis not present

## 2016-02-21 DIAGNOSIS — R609 Edema, unspecified: Secondary | ICD-10-CM | POA: Diagnosis not present

## 2016-02-21 DIAGNOSIS — E039 Hypothyroidism, unspecified: Secondary | ICD-10-CM | POA: Diagnosis not present

## 2016-02-21 DIAGNOSIS — D649 Anemia, unspecified: Secondary | ICD-10-CM | POA: Diagnosis not present

## 2016-02-22 LAB — BASIC METABOLIC PANEL
BUN / CREAT RATIO: 20 (ref 12–28)
BUN: 17 mg/dL (ref 8–27)
CALCIUM: 9.4 mg/dL (ref 8.7–10.3)
CHLORIDE: 103 mmol/L (ref 96–106)
CO2: 23 mmol/L (ref 18–29)
Creatinine, Ser: 0.84 mg/dL (ref 0.57–1.00)
GFR calc non Af Amer: 73 mL/min/{1.73_m2} (ref >59)
GFR, EST AFRICAN AMERICAN: 84 mL/min/{1.73_m2} (ref >59)
Glucose: 90 mg/dL (ref 65–99)
Potassium: 4.7 mmol/L (ref 3.5–5.2)
Sodium: 143 mmol/L (ref 134–144)

## 2016-02-22 LAB — CBC WITH DIFFERENTIAL/PLATELET
BASOS: 0 %
Basophils Absolute: 0 10*3/uL (ref 0.0–0.2)
EOS (ABSOLUTE): 0.3 10*3/uL (ref 0.0–0.4)
Eos: 3 %
HEMOGLOBIN: 12.2 g/dL (ref 11.1–15.9)
Hematocrit: 37.9 % (ref 34.0–46.6)
Immature Grans (Abs): 0 10*3/uL (ref 0.0–0.1)
Immature Granulocytes: 0 %
Lymphocytes Absolute: 3.4 10*3/uL — ABNORMAL HIGH (ref 0.7–3.1)
Lymphs: 33 %
MCH: 28.3 pg (ref 26.6–33.0)
MCHC: 32.2 g/dL (ref 31.5–35.7)
MCV: 88 fL (ref 79–97)
MONOCYTES: 9 %
Monocytes Absolute: 0.9 10*3/uL (ref 0.1–0.9)
NEUTROS ABS: 5.6 10*3/uL (ref 1.4–7.0)
Neutrophils: 55 %
Platelets: 292 10*3/uL (ref 150–379)
RBC: 4.31 x10E6/uL (ref 3.77–5.28)
RDW: 14.2 % (ref 12.3–15.4)
WBC: 10.3 10*3/uL (ref 3.4–10.8)

## 2016-02-22 LAB — TSH: TSH: 0.715 u[IU]/mL (ref 0.450–4.500)

## 2016-02-24 ENCOUNTER — Ambulatory Visit: Payer: Medicare Other | Admitting: Nurse Practitioner

## 2016-02-25 ENCOUNTER — Encounter: Payer: Self-pay | Admitting: Nurse Practitioner

## 2016-02-25 ENCOUNTER — Ambulatory Visit (INDEPENDENT_AMBULATORY_CARE_PROVIDER_SITE_OTHER): Payer: Medicare Other | Admitting: Nurse Practitioner

## 2016-02-25 VITALS — BP 122/78 | Ht 64.0 in | Wt 260.0 lb

## 2016-02-25 DIAGNOSIS — S30860A Insect bite (nonvenomous) of lower back and pelvis, initial encounter: Secondary | ICD-10-CM | POA: Diagnosis not present

## 2016-02-25 DIAGNOSIS — W57XXXA Bitten or stung by nonvenomous insect and other nonvenomous arthropods, initial encounter: Secondary | ICD-10-CM | POA: Diagnosis not present

## 2016-02-25 DIAGNOSIS — E039 Hypothyroidism, unspecified: Secondary | ICD-10-CM

## 2016-02-25 NOTE — Progress Notes (Signed)
Subjective:  Presents for routine follow-up of her thyroid. Compliant with medication. Recently had her labs done. Takes Lasix only on a rare basis, has lost significant amount of weight through Weight [employer] and does not need it as much anymore. Also complaints of a tick bite noticed yesterday. Mildly pruritic. No fever headache. No other rash. Has had very faint ringing in the ears over the past several days, questions whether she has any earwax.  Objective:   BP 122/78 mmHg  Ht 5\' 4"  (1.626 m)  Wt 260 lb (117.935 kg)  BMI 44.61 kg/m2 NAD. Alert, oriented. TMs minimal clear effusion, no erythema. No significant cerumen noted. Pharynx minimally injected. Neck supple with mild soft anterior adenopathy. Lungs clear. Heart regular rate rhythm. Thyroid no tenderness or goiter noted. Faint pink slightly raised area at the site of her tick bite on the right lower back area. No other rash noted.  Assessment:  Problem List Items Addressed This Visit      Endocrine   Hypothyroidism - Primary    Other Visit Diagnoses    Tick bite of back, initial encounter          Plan: Patient defers any further intervention for her ringing in her ears at this time. To call back if it persists. Cortisone cream to tick bite, reviewed signs and symptoms of tick disease. Return in about 6 months (around 08/27/2016) for physical.

## 2016-04-21 ENCOUNTER — Other Ambulatory Visit: Payer: Self-pay | Admitting: Family Medicine

## 2016-05-23 ENCOUNTER — Other Ambulatory Visit: Payer: Self-pay | Admitting: Nurse Practitioner

## 2016-05-23 ENCOUNTER — Other Ambulatory Visit: Payer: Self-pay | Admitting: Family Medicine

## 2016-08-04 ENCOUNTER — Telehealth: Payer: Self-pay | Admitting: Nurse Practitioner

## 2016-08-04 ENCOUNTER — Other Ambulatory Visit: Payer: Self-pay | Admitting: Nurse Practitioner

## 2016-08-04 DIAGNOSIS — E876 Hypokalemia: Secondary | ICD-10-CM

## 2016-08-04 DIAGNOSIS — Z79899 Other long term (current) drug therapy: Secondary | ICD-10-CM

## 2016-08-04 DIAGNOSIS — Z13220 Encounter for screening for lipoid disorders: Secondary | ICD-10-CM

## 2016-08-04 DIAGNOSIS — E039 Hypothyroidism, unspecified: Secondary | ICD-10-CM

## 2016-08-04 NOTE — Progress Notes (Signed)
Notified patient bloodwork has been ordered.  

## 2016-08-04 NOTE — Telephone Encounter (Signed)
Requesting order for blood work for appointment next month.

## 2016-08-04 NOTE — Telephone Encounter (Signed)
Ordered

## 2016-08-23 ENCOUNTER — Other Ambulatory Visit: Payer: Self-pay | Admitting: Family Medicine

## 2016-08-31 ENCOUNTER — Telehealth: Payer: Self-pay | Admitting: Nurse Practitioner

## 2016-08-31 DIAGNOSIS — Z13220 Encounter for screening for lipoid disorders: Secondary | ICD-10-CM | POA: Diagnosis not present

## 2016-08-31 DIAGNOSIS — Z79899 Other long term (current) drug therapy: Secondary | ICD-10-CM | POA: Diagnosis not present

## 2016-08-31 NOTE — Telephone Encounter (Signed)
ERROR

## 2016-09-01 LAB — HEPATIC FUNCTION PANEL
ALBUMIN: 4 g/dL (ref 3.6–4.8)
ALK PHOS: 67 IU/L (ref 39–117)
ALT: 10 IU/L (ref 0–32)
AST: 25 IU/L (ref 0–40)
BILIRUBIN TOTAL: 0.5 mg/dL (ref 0.0–1.2)
BILIRUBIN, DIRECT: 0.16 mg/dL (ref 0.00–0.40)
Total Protein: 6.6 g/dL (ref 6.0–8.5)

## 2016-09-01 LAB — LIPID PANEL
CHOLESTEROL TOTAL: 149 mg/dL (ref 100–199)
Chol/HDL Ratio: 2.2 ratio units (ref 0.0–4.4)
HDL: 67 mg/dL (ref >39)
LDL Calculated: 69 mg/dL (ref 0–99)
TRIGLYCERIDES: 64 mg/dL (ref 0–149)
VLDL Cholesterol Cal: 13 mg/dL (ref 5–40)

## 2016-09-04 ENCOUNTER — Encounter: Payer: Medicare Other | Admitting: Nurse Practitioner

## 2016-09-06 ENCOUNTER — Ambulatory Visit (INDEPENDENT_AMBULATORY_CARE_PROVIDER_SITE_OTHER): Payer: Medicare Other | Admitting: Nurse Practitioner

## 2016-09-06 ENCOUNTER — Encounter: Payer: Self-pay | Admitting: Nurse Practitioner

## 2016-09-06 VITALS — BP 110/72 | Ht 63.0 in | Wt 237.0 lb

## 2016-09-06 DIAGNOSIS — Z0000 Encounter for general adult medical examination without abnormal findings: Secondary | ICD-10-CM

## 2016-09-06 DIAGNOSIS — Z23 Encounter for immunization: Secondary | ICD-10-CM

## 2016-09-06 NOTE — Progress Notes (Signed)
   Subjective:    Patient ID: Ashlee Solomon, female    DOB: August 02, 1951, 65 y.o.   MRN: [id number]  HPI AWV- Annual Wellness Visit  The patient was seen for their annual wellness visit. The patient's past medical history, surgical history, and family history were reviewed. Pertinent vaccines were reviewed ( tetanus, pneumonia, shingles, flu) The patient's medication list was reviewed and updated.  The height and weight were entered. The patient's current BMI is: 41.98  Cognitive screening was completed. Outcome of Mini - Cog: pass  Falls within the past 6 months: none  Current tobacco usage: none (All patients who use tobacco were given written and verbal information on quitting)  Recent listing of emergency department/hospitalizations over the past year were reviewed.  current specialist the patient sees on a regular basis: [facility] for knee   Medicare annual wellness visit patient questionnaire was reviewed.  A written screening schedule for the patient for the next 5-10 years was given. Appropriate discussion of followup regarding next visit was discussed.  Presents for her wellness exam. No vaginal bleeding or pelvic pain. Same sexual partner. Regular vision and dental exams. Has lost about 80 lbs on weight watchers. Regular walking and riding stationary bike. Takes daily vitamin D, calcium and magnesium. See orthopedic specialist for right knee pain.      Review of Systems  Constitutional: Negative for activity change, appetite change and fatigue.  HENT: Negative for dental problem, ear pain, sinus pressure and sore throat.   Respiratory: Negative for cough, chest tightness, shortness of breath and wheezing.   Cardiovascular: Negative for chest pain.  Gastrointestinal: Negative for abdominal distention, abdominal pain, blood in stool, constipation, diarrhea, nausea and vomiting.  Genitourinary: Negative for difficulty urinating, dysuria, enuresis, frequency, genital  sores, pelvic pain, urgency, vaginal bleeding and vaginal discharge.       Objective:   Physical Exam  Constitutional: She is oriented to person, place, and time. She appears well-developed. No distress.  HENT:  Right Ear: External ear normal.  Left Ear: External ear normal.  Mouth/Throat: Oropharynx is clear and moist.  Neck: Normal range of motion. Neck supple. No tracheal deviation present. No thyromegaly present.  Cardiovascular: Normal rate, regular rhythm and normal heart sounds.  Exam reveals no gallop.   No murmur heard. Pulmonary/Chest: Effort normal and breath sounds normal.  Abdominal: Soft. She exhibits no distension. There is no tenderness.  Genitourinary: Vagina normal and uterus normal. No vaginal discharge found.  Genitourinary Comments: External GU: no rashes or lesions. Vagina: pale, no discharge. Bimanual exam: no tenderness or obvious masses. Rectal exam: no masses or stool for hemoccult.   Musculoskeletal: She exhibits no edema.  Lymphadenopathy:    She has no cervical adenopathy.  Neurological: She is alert and oriented to person, place, and time.  Skin: Skin is warm and dry. No rash noted.  Psychiatric: She has a normal mood and affect. Her behavior is normal.  Vitals reviewed. Breast exam: no masses; axillae no adenopathy.        Assessment & Plan:  Routine general medical examination at a health care facility  Need for vaccination - Plan: Pneumococcal conjugate vaccine 13-valent IM, Flu Vaccine QUAD 36+ mos PF IM (Fluarix & Fluzone Quad PF), CANCELED: Flu vaccine greater than or equal to 3yo preservative free IM  Patient to schedule her own mammogram. Hold on shingles vaccine for newer Shingrix when it is available.  Return in about 6 months (around 03/06/2017) for recheck.

## 2016-09-20 DIAGNOSIS — M1711 Unilateral primary osteoarthritis, right knee: Secondary | ICD-10-CM | POA: Diagnosis not present

## 2016-09-21 ENCOUNTER — Other Ambulatory Visit: Payer: Self-pay | Admitting: Nurse Practitioner

## 2016-09-21 ENCOUNTER — Other Ambulatory Visit: Payer: Self-pay | Admitting: Family Medicine

## 2016-09-29 DIAGNOSIS — M1711 Unilateral primary osteoarthritis, right knee: Secondary | ICD-10-CM | POA: Diagnosis not present

## 2016-10-05 DIAGNOSIS — G8929 Other chronic pain: Secondary | ICD-10-CM | POA: Diagnosis not present

## 2016-10-05 DIAGNOSIS — M1711 Unilateral primary osteoarthritis, right knee: Secondary | ICD-10-CM | POA: Diagnosis not present

## 2016-10-05 DIAGNOSIS — M25561 Pain in right knee: Secondary | ICD-10-CM | POA: Diagnosis not present

## 2016-11-17 DIAGNOSIS — M1711 Unilateral primary osteoarthritis, right knee: Secondary | ICD-10-CM | POA: Diagnosis not present

## 2016-11-23 ENCOUNTER — Telehealth: Payer: Self-pay | Admitting: Nurse Practitioner

## 2016-11-23 NOTE — Telephone Encounter (Signed)
Patients says that her Celebrex is a Tier 4 and its too expensive for her.  She is hoping Hoyle Sauer can change this to something cheaper.   [facility]

## 2016-11-24 ENCOUNTER — Other Ambulatory Visit: Payer: Self-pay | Admitting: Nurse Practitioner

## 2016-11-24 MED ORDER — MELOXICAM 15 MG PO TABS: 15.0000 mg | ORAL_TABLET | Freq: Every day | ORAL | 2 refills | Status: DC

## 2016-11-24 NOTE — Telephone Encounter (Signed)
Switched to once a day Meloxicam. Take with food. Stop if stomach upset. Call back if any problems.

## 2016-11-24 NOTE — Telephone Encounter (Signed)
Notified patient Ashlee Solomon switched to once a day Meloxicam. Take with food. Stop if stomach upset. Call back if any problems. Patient verbalized understanding.

## 2017-01-11 ENCOUNTER — Other Ambulatory Visit: Payer: Self-pay | Admitting: Family Medicine

## 2017-02-13 ENCOUNTER — Telehealth: Payer: Self-pay | Admitting: *Deleted

## 2017-02-13 NOTE — Telephone Encounter (Signed)
Pt last seen nov 2017. Requesting 90 day supply on metoprolol succ er 50mg  one daily, meloxicam 15mg  one daily, levothyroxine 175 mcg one daily, and celecoxib 200mg  one daily as needed.

## 2017-02-14 ENCOUNTER — Other Ambulatory Visit: Payer: Self-pay | Admitting: Nurse Practitioner

## 2017-02-14 MED ORDER — METOPROLOL SUCCINATE ER 50 MG PO TB24: 50.0000 mg | ORAL_TABLET | Freq: Every day | ORAL | 0 refills | Status: DC

## 2017-02-14 MED ORDER — LEVOTHYROXINE SODIUM 175 MCG PO TABS: 175.0000 ug | ORAL_TABLET | Freq: Every morning | ORAL | 0 refills | Status: DC

## 2017-02-14 MED ORDER — MELOXICAM 15 MG PO TABS: 15.0000 mg | ORAL_TABLET | Freq: Every day | ORAL | 0 refills | Status: DC

## 2017-02-14 NOTE — Telephone Encounter (Signed)
Only taking meloxicam.

## 2017-02-14 NOTE — Telephone Encounter (Signed)
Is she taking Meloxicam and celecoxib? These are both anti inflammatories. Insurance will probably only pay for one. Let me know and I will reorder meds.

## 2017-02-14 NOTE — Telephone Encounter (Signed)
Ordered. Yearly labs due in May.

## 2017-02-19 ENCOUNTER — Telehealth: Payer: Self-pay | Admitting: Family Medicine

## 2017-02-19 ENCOUNTER — Other Ambulatory Visit: Payer: Self-pay | Admitting: Nurse Practitioner

## 2017-02-19 DIAGNOSIS — E039 Hypothyroidism, unspecified: Secondary | ICD-10-CM

## 2017-02-19 DIAGNOSIS — Z13220 Encounter for screening for lipoid disorders: Secondary | ICD-10-CM

## 2017-02-19 DIAGNOSIS — Z79899 Other long term (current) drug therapy: Secondary | ICD-10-CM

## 2017-02-19 DIAGNOSIS — R609 Edema, unspecified: Secondary | ICD-10-CM

## 2017-02-19 NOTE — Telephone Encounter (Signed)
bw orders ready. Pt notified. 

## 2017-02-19 NOTE — Telephone Encounter (Signed)
Pt called requesting lab orders to be sent over for an upcoming appt. Last labs per epic were: hepatic,lipid,tsh,cbc,and bmp on 02/21/16 .

## 2017-02-19 NOTE — Telephone Encounter (Signed)
Please order lipid, liver, met 7 and TSH (hypothyroidism) 

## 2017-02-20 ENCOUNTER — Telehealth: Payer: Self-pay | Admitting: Family Medicine

## 2017-02-20 NOTE — Telephone Encounter (Signed)
Patient said she was just notified that her blood work has been ordered.  She is wanting to know if the test for lyme disease can be added because she had a recent tick bite.

## 2017-02-20 NOTE — Telephone Encounter (Signed)
This test is notoriously inaccurate (over and under diagnoses) so if has sympotoms, needs appt first, if does not have symtoms does not need b w, experts do not rec testing after ea bite

## 2017-02-20 NOTE — Telephone Encounter (Signed)
Spoke with patient and informed her per Dr.Steve Luking- This test is notoriously inaccurate (over and under diagnoses) so if has sympotoms, needs appt first, if does not have symtoms does not need blood work, Programmer, multimedia do not recommend  testing after each bite. Patient verbalized understanding.

## 2017-03-01 DIAGNOSIS — Z79899 Other long term (current) drug therapy: Secondary | ICD-10-CM | POA: Diagnosis not present

## 2017-03-01 DIAGNOSIS — Z13220 Encounter for screening for lipoid disorders: Secondary | ICD-10-CM | POA: Diagnosis not present

## 2017-03-01 DIAGNOSIS — E039 Hypothyroidism, unspecified: Secondary | ICD-10-CM | POA: Diagnosis not present

## 2017-03-02 LAB — HEPATIC FUNCTION PANEL
ALK PHOS: 63 IU/L (ref 39–117)
ALT: 17 IU/L (ref 0–32)
AST: 34 IU/L (ref 0–40)
Albumin: 4.3 g/dL (ref 3.6–4.8)
BILIRUBIN TOTAL: 0.4 mg/dL (ref 0.0–1.2)
BILIRUBIN, DIRECT: 0.12 mg/dL (ref 0.00–0.40)
Total Protein: 6.7 g/dL (ref 6.0–8.5)

## 2017-03-02 LAB — LIPID PANEL
Chol/HDL Ratio: 2 ratio (ref 0.0–4.4)
Cholesterol, Total: 149 mg/dL (ref 100–199)
HDL: 74 mg/dL (ref >39)
LDL Calculated: 61 mg/dL (ref 0–99)
TRIGLYCERIDES: 68 mg/dL (ref 0–149)
VLDL Cholesterol Cal: 14 mg/dL (ref 5–40)

## 2017-03-02 LAB — BASIC METABOLIC PANEL
BUN / CREAT RATIO: 21 (ref 12–28)
BUN: 17 mg/dL (ref 8–27)
CHLORIDE: 102 mmol/L (ref 96–106)
CO2: 28 mmol/L (ref 18–29)
CREATININE: 0.82 mg/dL (ref 0.57–1.00)
Calcium: 9.9 mg/dL (ref 8.7–10.3)
GFR calc non Af Amer: 74 mL/min/{1.73_m2} (ref >59)
GFR, EST AFRICAN AMERICAN: 86 mL/min/{1.73_m2} (ref >59)
GLUCOSE: 96 mg/dL (ref 65–99)
Potassium: 4.9 mmol/L (ref 3.5–5.2)
SODIUM: 143 mmol/L (ref 134–144)

## 2017-03-02 LAB — TSH: TSH: 1.4 u[IU]/mL (ref 0.450–4.500)

## 2017-03-05 ENCOUNTER — Ambulatory Visit (INDEPENDENT_AMBULATORY_CARE_PROVIDER_SITE_OTHER): Payer: Medicare Other | Admitting: Nurse Practitioner

## 2017-03-05 ENCOUNTER — Other Ambulatory Visit: Payer: Self-pay | Admitting: Family Medicine

## 2017-03-05 ENCOUNTER — Encounter: Payer: Self-pay | Admitting: Nurse Practitioner

## 2017-03-05 VITALS — BP 126/72 | Ht 63.0 in | Wt 230.0 lb

## 2017-03-05 DIAGNOSIS — E039 Hypothyroidism, unspecified: Secondary | ICD-10-CM

## 2017-03-05 DIAGNOSIS — R609 Edema, unspecified: Secondary | ICD-10-CM

## 2017-03-05 DIAGNOSIS — Z1231 Encounter for screening mammogram for malignant neoplasm of breast: Secondary | ICD-10-CM | POA: Diagnosis not present

## 2017-03-05 NOTE — Progress Notes (Signed)
Subjective:  Presents for recheck on thyroid. Compliant with medications. Takes Lasix and potassium about once a week, depending on how much she is on her feet. No CP/ischemic type pain or SOB. On Weight Watchers. Struggling with weight loss, but continues to lose at a steady pace. Is due for screening mammogram.   Objective:   BP 126/72   Ht 5\' 3"  (1.6 m)   Wt 230 lb (104.3 kg)   BMI 40.74 kg/m  NAD. Alert, oriented. Thyroid: nontender, no obvious mass or goiter noted. Lungs clear. Heart RRR. Lower extremities trace pitting edema. Reviewed labs with patient from 5/10. Results for orders placed or performed in visit on 02/19/17  Lipid panel  Result Value Ref Range   Cholesterol, Total 149 100 - 199 mg/dL   Triglycerides 68 0 - 149 mg/dL   HDL 74 >39 mg/dL   VLDL Cholesterol Cal 14 5 - 40 mg/dL   LDL Calculated 61 0 - 99 mg/dL   Chol/HDL Ratio 2.0 0.0 - 4.4 ratio  Hepatic function panel  Result Value Ref Range   Total Protein 6.7 6.0 - 8.5 g/dL   Albumin 4.3 3.6 - 4.8 g/dL   Bilirubin Total 0.4 0.0 - 1.2 mg/dL   Bilirubin, Direct 0.12 0.00 - 0.40 mg/dL   Alkaline Phosphatase 63 39 - 117 IU/L   AST 34 0 - 40 IU/L   ALT 17 0 - 32 IU/L  Basic metabolic panel  Result Value Ref Range   Glucose 96 65 - 99 mg/dL   BUN 17 8 - 27 mg/dL   Creatinine, Ser 0.82 0.57 - 1.00 mg/dL   GFR calc non Af Amer 74 >59 mL/min/1.73   GFR calc Af Amer 86 >59 mL/min/1.73   BUN/Creatinine Ratio 21 12 - 28   Sodium 143 134 - 144 mmol/L   Potassium 4.9 3.5 - 5.2 mmol/L   Chloride 102 96 - 106 mmol/L   CO2 28 18 - 29 mmol/L   Calcium 9.9 8.7 - 10.3 mg/dL  TSH  Result Value Ref Range   TSH 1.400 0.450 - 4.500 uIU/mL     Assessment:   Problem List Items Addressed This Visit      Endocrine   Hypothyroidism - Primary     Other   Peripheral edema    Other Visit Diagnoses    Screening mammogram, encounter for       Relevant Orders   MM DIGITAL SCREENING BILATERAL       Plan:  Continue  current medications and weight loss efforts. Increase activity.    Return in about 6 months (around 09/05/2017) for physical.

## 2017-03-15 ENCOUNTER — Ambulatory Visit (HOSPITAL_COMMUNITY): Payer: Medicare Other

## 2017-03-15 ENCOUNTER — Ambulatory Visit (HOSPITAL_COMMUNITY)
Admission: RE | Admit: 2017-03-15 | Discharge: 2017-03-15 | Disposition: A | Discharge status: Home / Self Care | Payer: Medicare Other | Source: Ambulatory Visit | Attending: Nurse Practitioner | Admitting: Nurse Practitioner

## 2017-03-15 DIAGNOSIS — Z1231 Encounter for screening mammogram for malignant neoplasm of breast: Secondary | ICD-10-CM | POA: Insufficient documentation

## 2017-04-24 DIAGNOSIS — M1711 Unilateral primary osteoarthritis, right knee: Secondary | ICD-10-CM | POA: Diagnosis not present

## 2017-05-02 DIAGNOSIS — M1711 Unilateral primary osteoarthritis, right knee: Secondary | ICD-10-CM | POA: Diagnosis not present

## 2017-05-11 DIAGNOSIS — M1711 Unilateral primary osteoarthritis, right knee: Secondary | ICD-10-CM | POA: Diagnosis not present

## 2017-05-12 ENCOUNTER — Other Ambulatory Visit: Payer: Self-pay | Admitting: Nurse Practitioner

## 2017-06-01 ENCOUNTER — Other Ambulatory Visit: Payer: Self-pay | Admitting: Nurse Practitioner

## 2017-08-15 ENCOUNTER — Other Ambulatory Visit: Payer: Self-pay | Admitting: Nurse Practitioner

## 2017-08-15 NOTE — Telephone Encounter (Signed)
Last seen 03/05/17

## 2017-08-23 ENCOUNTER — Telehealth: Payer: Self-pay | Admitting: Nurse Practitioner

## 2017-08-23 NOTE — Telephone Encounter (Signed)
Patient has a physical scheduled with Hoyle Sauer on 09/10/17.  Would like to have labs done at [facility] prior to appt and needs orders put in.

## 2017-08-24 NOTE — Telephone Encounter (Signed)
No labs needed. Done in May.

## 2017-08-24 NOTE — Telephone Encounter (Signed)
Left a vm asked to r/c.

## 2017-08-27 ENCOUNTER — Other Ambulatory Visit: Payer: Self-pay | Admitting: Nurse Practitioner

## 2017-08-27 NOTE — Telephone Encounter (Signed)
I called and left a detailed message pt did not need labs drawn. If any questions call our office.

## 2017-09-07 ENCOUNTER — Encounter: Payer: Medicare Other | Admitting: Nurse Practitioner

## 2017-09-10 ENCOUNTER — Ambulatory Visit (INDEPENDENT_AMBULATORY_CARE_PROVIDER_SITE_OTHER): Payer: Medicare Other | Admitting: Nurse Practitioner

## 2017-09-10 ENCOUNTER — Encounter: Payer: Self-pay | Admitting: Nurse Practitioner

## 2017-09-10 VITALS — BP 112/80 | Ht 63.0 in | Wt 219.0 lb

## 2017-09-10 DIAGNOSIS — Z23 Encounter for immunization: Secondary | ICD-10-CM

## 2017-09-10 DIAGNOSIS — Z0000 Encounter for general adult medical examination without abnormal findings: Secondary | ICD-10-CM | POA: Diagnosis not present

## 2017-09-13 ENCOUNTER — Encounter: Payer: Self-pay | Admitting: Nurse Practitioner

## 2017-09-13 NOTE — Progress Notes (Signed)
   Subjective:    Patient ID: Ashlee Solomon, female    DOB: 07-02-1951, 66 y.o.   MRN: [id number]  HPI presents for her wellness exam.  No vaginal bleeding or pelvic pain.  Same sexual partner.  Has had skin cancer screening.  Regular vision and dental exams.  Continues follow-up with orthopedic doctor regarding chronic right knee pain.  Rides her stationary bike or walks 1-2 miles per day.  Has recently joined [employer] and doing well with her weight loss.  Defers bone density scan at this time.    Review of Systems  Constitutional: Negative for activity change, appetite change and fatigue.  HENT: Negative for dental problem, ear pain, sinus pressure and sore throat.   Respiratory: Negative for cough, chest tightness, shortness of breath and wheezing.   Cardiovascular: Negative for chest pain.  Gastrointestinal: Negative for abdominal distention, abdominal pain, blood in stool, constipation, diarrhea, nausea and vomiting.  Genitourinary: Negative for difficulty urinating, dysuria, enuresis, frequency, genital sores, pelvic pain, urgency, vaginal bleeding and vaginal discharge.   Depression screen [facility] 2/9 09/10/2017 09/06/2016  Decreased Interest 0 0  Down, Depressed, Hopeless 0 0  PHQ - 2 Score 0 0        Objective:   Physical Exam  Constitutional: She is oriented to person, place, and time. She appears well-developed. No distress.  HENT:  Right Ear: External ear normal.  Left Ear: External ear normal.  Mouth/Throat: Oropharynx is clear and moist.  Neck: Normal range of motion. Neck supple. No tracheal deviation present. No thyromegaly present.  Cardiovascular: Normal rate, regular rhythm and normal heart sounds. Exam reveals no gallop.  No murmur heard. Pulmonary/Chest: Effort normal and breath sounds normal. Right breast exhibits no inverted nipple, no mass, no skin change and no tenderness. Left breast exhibits no inverted nipple, no mass, no skin change and no tenderness.  Breasts are symmetrical.  Axillae no adenopathy.  Abdominal: Soft. She exhibits no distension. There is no tenderness.  Genitourinary: Vagina normal and uterus normal. No vaginal discharge found.  Genitourinary Comments: External GU: no rashes or lesions. Vagina: no discharge. Bimanual exam: no tenderness or obvious masses.   Musculoskeletal: She exhibits no edema.  Lymphadenopathy:    She has no cervical adenopathy.  Neurological: She is alert and oriented to person, place, and time.  Skin: Skin is warm and dry. No rash noted.  Psychiatric: She has a normal mood and affect. Her behavior is normal.  Vitals reviewed.         Assessment & Plan:  Annual physical exam  Need for influenza vaccination - Plan: Flu Vaccine QUAD 6+ mos PF IM (Fluarix Quad PF)  Recommend continued weight loss and healthy diet.  Continue activity.  Recommend daily vitamin D and calcium supplementation. Return in about 6 months (around 03/10/2018) for recheck with yearly labs.

## 2017-09-27 ENCOUNTER — Other Ambulatory Visit: Payer: Self-pay

## 2017-11-12 ENCOUNTER — Other Ambulatory Visit: Payer: Self-pay | Admitting: Nurse Practitioner

## 2017-11-15 ENCOUNTER — Other Ambulatory Visit: Payer: Self-pay | Admitting: Nurse Practitioner

## 2017-11-26 ENCOUNTER — Other Ambulatory Visit: Payer: Self-pay | Admitting: Nurse Practitioner

## 2017-12-07 DIAGNOSIS — M1711 Unilateral primary osteoarthritis, right knee: Secondary | ICD-10-CM | POA: Diagnosis not present

## 2017-12-13 DIAGNOSIS — M1711 Unilateral primary osteoarthritis, right knee: Secondary | ICD-10-CM | POA: Diagnosis not present

## 2017-12-13 DIAGNOSIS — M25561 Pain in right knee: Secondary | ICD-10-CM | POA: Diagnosis not present

## 2017-12-21 DIAGNOSIS — M25561 Pain in right knee: Secondary | ICD-10-CM | POA: Diagnosis not present

## 2017-12-21 DIAGNOSIS — M1711 Unilateral primary osteoarthritis, right knee: Secondary | ICD-10-CM | POA: Diagnosis not present

## 2017-12-31 DIAGNOSIS — H43812 Vitreous degeneration, left eye: Secondary | ICD-10-CM | POA: Diagnosis not present

## 2017-12-31 DIAGNOSIS — H25813 Combined forms of age-related cataract, bilateral: Secondary | ICD-10-CM | POA: Diagnosis not present

## 2017-12-31 DIAGNOSIS — H5203 Hypermetropia, bilateral: Secondary | ICD-10-CM | POA: Diagnosis not present

## 2017-12-31 DIAGNOSIS — H52223 Regular astigmatism, bilateral: Secondary | ICD-10-CM | POA: Diagnosis not present

## 2017-12-31 DIAGNOSIS — H524 Presbyopia: Secondary | ICD-10-CM | POA: Diagnosis not present

## 2018-01-10 DIAGNOSIS — J029 Acute pharyngitis, unspecified: Secondary | ICD-10-CM | POA: Diagnosis not present

## 2018-01-10 DIAGNOSIS — J019 Acute sinusitis, unspecified: Secondary | ICD-10-CM | POA: Diagnosis not present

## 2018-01-10 DIAGNOSIS — J209 Acute bronchitis, unspecified: Secondary | ICD-10-CM | POA: Diagnosis not present

## 2018-01-14 ENCOUNTER — Ambulatory Visit (INDEPENDENT_AMBULATORY_CARE_PROVIDER_SITE_OTHER): Payer: Medicare Other | Admitting: Nurse Practitioner

## 2018-01-14 ENCOUNTER — Encounter: Payer: Self-pay | Admitting: Nurse Practitioner

## 2018-01-14 VITALS — BP 122/84 | Temp 97.7°F | Ht 63.0 in | Wt 220.4 lb

## 2018-01-14 DIAGNOSIS — B9689 Other specified bacterial agents as the cause of diseases classified elsewhere: Secondary | ICD-10-CM

## 2018-01-14 DIAGNOSIS — J019 Acute sinusitis, unspecified: Secondary | ICD-10-CM | POA: Diagnosis not present

## 2018-01-14 DIAGNOSIS — J209 Acute bronchitis, unspecified: Secondary | ICD-10-CM

## 2018-01-14 DIAGNOSIS — R062 Wheezing: Secondary | ICD-10-CM

## 2018-01-14 MED ORDER — ALBUTEROL SULFATE (2.5 MG/3ML) 0.083% IN NEBU: INHALATION_SOLUTION | RESPIRATORY_TRACT | 0 refills | Status: DC

## 2018-01-14 MED ORDER — ALBUTEROL SULFATE (2.5 MG/3ML) 0.083% IN NEBU
2.5000 mg | INHALATION_SOLUTION | Freq: Once | RESPIRATORY_TRACT | Status: AC
  Administered 2018-01-14: 2.5 mg via RESPIRATORY_TRACT

## 2018-01-14 MED ORDER — HYDROCODONE-HOMATROPINE 5-1.5 MG/5ML PO SYRP: 5.0000 mL | ORAL_SOLUTION | ORAL | 0 refills | Status: DC | PRN

## 2018-01-14 MED ORDER — AMOXICILLIN-POT CLAVULANATE 875-125 MG PO TABS: 1.0000 | ORAL_TABLET | Freq: Two times a day (BID) | ORAL | 0 refills | Status: DC

## 2018-01-14 MED ORDER — ALBUTEROL SULFATE HFA 108 (90 BASE) MCG/ACT IN AERS: 2.0000 | INHALATION_SPRAY | RESPIRATORY_TRACT | 0 refills | Status: DC | PRN

## 2018-01-14 MED ORDER — PREDNISONE 20 MG PO TABS: ORAL_TABLET | ORAL | 0 refills | Status: DC

## 2018-01-14 NOTE — Progress Notes (Signed)
Subjective:  Presents for c/o cough and congestion that began last week. Went to urgent care in [place] on 3/21. Paper work states they diagnosed her with sinusitis and bronchitis. Given Doxycyline which caused diarrhea last night and vomiting x 1 this am so she has stopped this. Caused stomach upset. Low grade fever. Sore throat. Frontal area headache. Runny nose. Very frequent congested cough producing yellow sputum. Wheezing at times. Her albuterol inhaler was out of date but used this anyway with some relief. Bilateral ear pain. No abdominal pain. Has a neb machine at home but needs medicine. Symptoms are worse.   Objective:   BP 122/84   Temp 97.7 F (36.5 C) (Oral)   Ht 5\' 3"  (1.6 m)   Wt 220 lb 6.4 oz (100 kg)   BMI 39.04 kg/m  NAD. Alert, oriented. TMs retracted bilat; clear effusion. Pharynx injected with PND noted.  Neck supple with mild soft anterior adenopathy.   lungs initially mildly diminished breath sounds in general with faint coarse expiratory crackles.  Frequent congested cough noted.  No wheezing or tachypnea.  Given albuterol 2.5 mg nebulizer treatment.  Airflow much improved with subjective improvement of symptoms.  No further crackles noted.  Continues to have frequent congested cough.  O2 sat 99%.  Heart regular rate and rhythm.  Assessment:  Acute bacterial rhinosinusitis  Acute bronchitis, unspecified organism  Wheezing - Plan: albuterol (PROVENTIL) (2.5 MG/3ML) 0.083% nebulizer solution 2.5 mg    Plan:   Meds ordered this encounter  Medications  . albuterol (PROVENTIL HFA;VENTOLIN HFA) 108 (90 Base) MCG/ACT inhaler    Sig: Inhale 2 puffs into the lungs every 4 (four) hours as needed for wheezing.    Dispense:  1 Inhaler    Refill:  0    Order Specific Question:   Supervising Provider    Answer:   Mikey Kirschner [[id number]]  . albuterol (PROVENTIL) (2.5 MG/3ML) 0.083% nebulizer solution    Sig: Use one vial via neb q 4 hours prn wheezing    Dispense:  25  vial    Refill:  0    Order Specific Question:   Supervising Provider    Answer:   Mikey Kirschner [[id number]]  . predniSONE (DELTASONE) 20 MG tablet    Sig: 3 po qd x 3 d then 2 po qd x 3 d then 1 po qd x 2 d    Dispense:  17 tablet    Refill:  0    Order Specific Question:   Supervising Provider    Answer:   Mikey Kirschner [[id number]]  . HYDROcodone-homatropine (HYCODAN) 5-1.5 MG/5ML syrup    Sig: Take 5 mLs by mouth every 4 (four) hours as needed.    Dispense:  90 mL    Refill:  0    Order Specific Question:   Supervising Provider    Answer:   Mikey Kirschner [[id number]]  . amoxicillin-clavulanate (AUGMENTIN) 875-125 MG tablet    Sig: Take 1 tablet by mouth 2 (two) times daily.    Dispense:  20 tablet    Refill:  0    Order Specific Question:   Supervising Provider    Answer:   Mikey Kirschner [[id number]]  . albuterol (PROVENTIL) (2.5 MG/3ML) 0.083% nebulizer solution 2.5 mg   May alternate inhaler with nebulizer treatments as directed.  OTC meds as directed for daytime cough and congestion.  Warning signs reviewed.  Call back in 72 hours if no improvement in symptoms, call or go  to ED sooner if worse.

## 2018-01-18 ENCOUNTER — Other Ambulatory Visit: Payer: Self-pay

## 2018-01-18 ENCOUNTER — Emergency Department (HOSPITAL_COMMUNITY): Payer: Medicare Other

## 2018-01-18 ENCOUNTER — Encounter (HOSPITAL_COMMUNITY): Payer: Self-pay

## 2018-01-18 ENCOUNTER — Telehealth: Payer: Self-pay

## 2018-01-18 ENCOUNTER — Emergency Department (HOSPITAL_COMMUNITY)
Admission: EM | Admit: 2018-01-18 | Discharge: 2018-01-18 | Disposition: A | Discharge status: Home / Self Care | Payer: Medicare Other | Attending: Emergency Medicine | Admitting: Emergency Medicine

## 2018-01-18 DIAGNOSIS — I4891 Unspecified atrial fibrillation: Secondary | ICD-10-CM | POA: Insufficient documentation

## 2018-01-18 DIAGNOSIS — Z96652 Presence of left artificial knee joint: Secondary | ICD-10-CM | POA: Insufficient documentation

## 2018-01-18 DIAGNOSIS — Z87891 Personal history of nicotine dependence: Secondary | ICD-10-CM | POA: Insufficient documentation

## 2018-01-18 DIAGNOSIS — R05 Cough: Secondary | ICD-10-CM | POA: Diagnosis not present

## 2018-01-18 DIAGNOSIS — I10 Essential (primary) hypertension: Secondary | ICD-10-CM | POA: Diagnosis not present

## 2018-01-18 DIAGNOSIS — R0602 Shortness of breath: Secondary | ICD-10-CM | POA: Diagnosis not present

## 2018-01-18 DIAGNOSIS — Z79899 Other long term (current) drug therapy: Secondary | ICD-10-CM | POA: Diagnosis not present

## 2018-01-18 DIAGNOSIS — E039 Hypothyroidism, unspecified: Secondary | ICD-10-CM | POA: Insufficient documentation

## 2018-01-18 LAB — BASIC METABOLIC PANEL
Anion gap: 13 (ref 5–15)
BUN: 22 mg/dL — ABNORMAL HIGH (ref 6–20)
CHLORIDE: 103 mmol/L (ref 101–111)
CO2: 22 mmol/L (ref 22–32)
Calcium: 9.7 mg/dL (ref 8.9–10.3)
Creatinine, Ser: 1.19 mg/dL — ABNORMAL HIGH (ref 0.44–1.00)
GFR calc non Af Amer: 46 mL/min — ABNORMAL LOW (ref >60)
GFR, EST AFRICAN AMERICAN: 53 mL/min — AB (ref >60)
Glucose, Bld: 117 mg/dL — ABNORMAL HIGH (ref 65–99)
POTASSIUM: 4.5 mmol/L (ref 3.5–5.1)
Sodium: 138 mmol/L (ref 135–145)

## 2018-01-18 LAB — BRAIN NATRIURETIC PEPTIDE: B Natriuretic Peptide: 230 pg/mL — ABNORMAL HIGH (ref 0.0–100.0)

## 2018-01-18 MED ORDER — RIVAROXABAN 15 MG PO TABS
15.0000 mg | ORAL_TABLET | Freq: Once | ORAL | Status: AC
  Administered 2018-01-18: 15 mg via ORAL
  Filled 2018-01-18 (×2): qty 1

## 2018-01-18 MED ORDER — RIVAROXABAN 15 MG PO TABS: 15.0000 mg | ORAL_TABLET | Freq: Every day | ORAL | 0 refills | Status: DC

## 2018-01-18 MED ORDER — HYDROCOD POLST-CPM POLST ER 10-8 MG/5ML PO SUER
5.0000 mL | Freq: Once | ORAL | Status: AC
  Administered 2018-01-18: 5 mL via ORAL
  Filled 2018-01-18: qty 5

## 2018-01-18 NOTE — Telephone Encounter (Signed)
Patient advised to go to ED due to persistent SOB.

## 2018-01-18 NOTE — Telephone Encounter (Signed)
Patient called today stating she was seen on Monday and was told then if no better call back. She states she is still having trouble breathing and she is having to use her nebulizing machine Q 4 hours and can not get across the room without giving out of breath. I told the pt we had no availability today that you may recommend her to go to the ed or urgent care. May need a chest xray.

## 2018-01-18 NOTE — Discharge Instructions (Addendum)
Your examination reveals new onset of atrial fibrillation.  This probably has a lot to do with your cough.  Please take an extra dose of Lasix tonight, take 40 mg of Lasix on tomorrow (Saturday).  Starting Sunday, March 31, resume your once daily dose of Lasix.  Please start Xarelto 1 tablet daily with supper.  Please call Dr. Harl Bowie for cardiology evaluation.  They will give you an appointment for follow-up and recheck concerning your atrial fibrillation.  Please do not use any nonsteroidal anti-inflammatory products.  Please see your primary physician or come to the emergency department if there are any falls or injuries while you are on the Xarelto.  Please return to the emergency department if any difficulty with breathing, changes in your condition, problems or concerns.

## 2018-01-18 NOTE — ED Triage Notes (Signed)
Pt reports cough for 3 weeks and has been on 2 abt and prednisone pack. Reports Sob with exertion and movement . Sent by primary care. Also reports she has been using albuterol every 4 hrs

## 2018-01-18 NOTE — ED Provider Notes (Signed)
[facility] EMERGENCY DEPARTMENT Provider Note   CSN: [id number] Arrival date & time: 01/18/18  1318     History   Chief Complaint Chief Complaint  Patient presents with  . Cough    HPI Ashlee Solomon is a 67 y.o. female.   Cough  This is a new problem. The current episode started more than 1 week ago. The problem has been gradually worsening. The cough is productive of sputum. There has been no fever. Associated symptoms include headaches, rhinorrhea and wheezing. Pertinent negatives include no chest pain, no chills, no sweats and no shortness of breath. She has tried cough syrup for the symptoms. She is not a smoker. Her past medical history is significant for bronchitis, pneumonia and asthma.    Past Medical History:  Diagnosis Date  . Arthritis   . DDD (degenerative disc disease)   . GERD (gastroesophageal reflux disease)   . Hypertension    clearance Dr Wolfgang Phoenix with note on chart  . Hypothyroidism   . Pneumonia 1/13  . Reactive airway disease     Patient Active Problem List   Diagnosis Date Noted  . Hypothyroidism 08/30/2015  . Peripheral edema 08/30/2015  . Morbid obesity ([place]) 08/30/2015  . Postop Hypokalemia 06/06/2012  . Postop Hyponatremia 06/05/2012  . Expected Blood loss anemia 06/05/2012  . OA (osteoarthritis) of knee 06/03/2012    Past Surgical History:  Procedure Laterality Date  . APPENDECTOMY    . CESAREAN SECTION     x 3  . COLONOSCOPY N/A 04/14/2015   Procedure: COLONOSCOPY;  Surgeon: Rogene Houston, MD;  [facility];  Service: Endoscopy;  Laterality: N/A;  830 - moved to 11:30 - Ann to notify pt  . TONSILLECTOMY    . TOTAL KNEE ARTHROPLASTY  06/03/2012   Procedure: TOTAL KNEE ARTHROPLASTY;  Surgeon: Gearlean Alf, MD;  Location: [facility] ORS;  Service: Orthopedics;  Laterality: Left;     OB History   None      Home Medications    Prior to Admission medications   Medication Sig Start Date End Date Taking? Authorizing Provider   albuterol (PROVENTIL HFA;VENTOLIN HFA) 108 (90 Base) MCG/ACT inhaler Inhale 2 puffs into the lungs every 4 (four) hours as needed for wheezing. 01/14/18   Nilda Simmer, NP  albuterol (PROVENTIL) (2.5 MG/3ML) 0.083% nebulizer solution Use one vial via neb q 4 hours prn wheezing 01/14/18   Nilda Simmer, NP  amoxicillin-clavulanate (AUGMENTIN) 875-125 MG tablet Take 1 tablet by mouth 2 (two) times daily. 01/14/18   Nilda Simmer, NP  furosemide (LASIX) 20 MG tablet TAKE 1 TABLET BY MOUTH EVERY DAY 11/15/17   Nilda Simmer, NP  HYDROcodone-homatropine [facility]) 5-1.5 MG/5ML syrup Take 5 mLs by mouth every 4 (four) hours as needed. 01/14/18   Nilda Simmer, NP  KLOR-CON M10 10 MEQ tablet TAKE 1 TABLET BY MOUTH EVERY DAY 11/15/17   Nilda Simmer, NP  levothyroxine (SYNTHROID, LEVOTHROID) 175 MCG tablet TAKE 1 TABLET BY MOUTH EVERY DAY IN THE MORNING 05/14/17   Nilda Simmer, NP  loratadine (CLARITIN) 10 MG tablet Take 10 mg by mouth daily with breakfast.     [provider]  magnesium oxide (MAG-OX) 400 MG tablet Take 400 mg by mouth at bedtime.     [provider]  meloxicam (MOBIC) 15 MG tablet TAKE 1 TABLET BY MOUTH EVERY DAY AS NEEDED FOR PAIN 11/12/17   Mikey Kirschner, MD  metoprolol succinate (TOPROL-XL)  50 MG 24 hr tablet TAKE 1 TABLET BY MOUTH DAILY. TAKE WITH OR IMMEDIATELY FOLLOWING A MEAL. 11/26/17   Nilda Simmer, NP  OVER THE COUNTER MEDICATION Vit C 500mg  qd Vit b one qd Vit d3 5000 qd Lutein 10mg  qd coq10 100 qd Butchers broom 2 caps qd    [provider]  Potassium 99 MG TABS Take by mouth daily.    [provider]  predniSONE (DELTASONE) 20 MG tablet 3 po qd x 3 d then 2 po qd x 3 d then 1 po qd x 2 d 01/14/18   Nilda Simmer, NP    Family History No family history on file.  Social History Social History   Tobacco Use  . Smoking status: Former Smoker    Packs/day: 0.25    Types: Cigarettes    Last  attempt to quit: 05/25/2007    Years since quitting: 10.6  . Smokeless tobacco: Never Used  Substance Use Topics  . Alcohol use: Yes    Comment: mixed drink nightly  . Drug use: No     Allergies   Patient has no known allergies.   Review of Systems Review of Systems  Constitutional: Negative for activity change and chills.       All ROS Neg except as noted in HPI  HENT: Positive for congestion and rhinorrhea. Negative for nosebleeds.   Eyes: Negative for photophobia and discharge.  Respiratory: Positive for cough and wheezing. Negative for shortness of breath.        Chest wall pain  Cardiovascular: Negative for chest pain and palpitations.  Gastrointestinal: Negative for abdominal pain and blood in stool.  Genitourinary: Negative for dysuria, frequency and hematuria.  Musculoskeletal: Negative for arthralgias, back pain and neck pain.  Skin: Negative.   Neurological: Positive for headaches. Negative for dizziness, seizures and speech difficulty.  Psychiatric/Behavioral: Negative for confusion and hallucinations.     Physical Exam Updated Vital Signs BP 108/85 (BP Location: Left Arm)   Pulse 68   Temp (!) 97.5 F (36.4 C) (Oral)   Resp 18   SpO2 100%   Physical Exam  Constitutional: She is oriented to person, place, and time. She appears well-developed and well-nourished.  Non-toxic appearance.  HENT:  Head: Normocephalic.  Right Ear: Tympanic membrane and external ear normal.  Left Ear: Tympanic membrane and external ear normal.  Eyes: Pupils are equal, round, and reactive to light. EOM and lids are normal.  Neck: Normal range of motion. Neck supple. Carotid bruit is not present.  Cardiovascular: Normal rate, regular rhythm, normal heart sounds, intact distal pulses and normal pulses.  Pulmonary/Chest:  Course breath sounds.  There is symmetrical rise and fall of the chest.  Patient has frequent cough during interview and examination.  Abdominal: Soft. Bowel sounds  are normal. There is no tenderness. There is no guarding.  Musculoskeletal: Normal range of motion.  Lymphadenopathy:       Head (right side): No submandibular adenopathy present.       Head (left side): No submandibular adenopathy present.    She has no cervical adenopathy.  Neurological: She is alert and oriented to person, place, and time. She has normal strength. No cranial nerve deficit or sensory deficit.  Skin: Skin is warm and dry.  Psychiatric: She has a normal mood and affect. Her speech is normal.  Nursing note and vitals reviewed.    ED Treatments / Results  Labs (all labs ordered are listed, but only abnormal results  are displayed) Labs Reviewed - No data to display  EKG None  Radiology Dg Chest 2 View  Result Date: 01/18/2018 CLINICAL DATA:  Productive cough. EXAM: CHEST - 2 VIEW COMPARISON:  Chest x-ray 05/24/2012. FINDINGS: Mediastinum and hilar structures normal the lungs are clear. No pleural effusion or pneumothorax. Heart size normal. Degenerative change thoracic spine. IMPRESSION: No acute cardiopulmonary disease. Electronically Signed   By: Marcello Moores  Register   On: 01/18/2018 13:45    Procedures Procedures (including critical care time)  Medications Ordered in ED Medications - No data to display   Initial Impression / Assessment and Plan / ED Course  I have reviewed the triage vital signs and the nursing notes.  Pertinent labs & imaging results that were available during my care of the patient were reviewed by me and considered in my medical decision making (see chart for details).       Final Clinical Impressions(s) / ED Diagnoses  MDM  Vital signs reviewed.  Patient has been treated for the last 2-3 weeks for cough.  She has been most recently placed on albuterol, Augmentin,And prednisone.  Patient has frequent cough during the examination.  Chest x-ray was negative for acute cardiopulmonary disease.  Patient noted to have an irregularly  irregular rhythm on auscultation.  Electrocardiogram shows atrial fibrillation with controlled ventricular response.  Case discussed with Dr Thurnell Garbe.  Basic metabolic panel shows the BUN to be elevated at 22 with a creatinine elevated at 1.19.  The anion gap is normal at 13.  B natruretic peptide is slightly elevated at 230.  Case discussed with cardiology.  Patient will be placed on Xarelto.  Patient will increase Lasix dose tonight.  Patient will be followed by Dr. Harl Bowie, or a member of his team.  I have advised the patient to return to the emergency department immediately if any changes, problems, or concerns.   Final diagnoses:  New onset atrial fibrillation [facility])    ED Discharge Orders        Ordered    Rivaroxaban (XARELTO) 15 MG TABS tablet  Daily with supper     01/18/18 1753       Lily Kocher, PA-C 01/18/18 [place], [place], DO 01/20/18 1504

## 2018-01-21 ENCOUNTER — Other Ambulatory Visit: Payer: Self-pay

## 2018-01-21 ENCOUNTER — Telehealth: Payer: Self-pay | Admitting: Cardiovascular Disease

## 2018-01-21 ENCOUNTER — Encounter: Payer: Self-pay | Admitting: Cardiovascular Disease

## 2018-01-21 ENCOUNTER — Ambulatory Visit (INDEPENDENT_AMBULATORY_CARE_PROVIDER_SITE_OTHER): Payer: Medicare Other | Admitting: Cardiovascular Disease

## 2018-01-21 VITALS — BP 100/69 | HR 90 | Ht 64.0 in | Wt 222.0 lb

## 2018-01-21 DIAGNOSIS — R0602 Shortness of breath: Secondary | ICD-10-CM

## 2018-01-21 DIAGNOSIS — Z9289 Personal history of other medical treatment: Secondary | ICD-10-CM | POA: Diagnosis not present

## 2018-01-21 DIAGNOSIS — I4891 Unspecified atrial fibrillation: Secondary | ICD-10-CM | POA: Diagnosis not present

## 2018-01-21 MED ORDER — RIVAROXABAN 15 MG PO TABS: 15.0000 mg | ORAL_TABLET | Freq: Every day | ORAL | 3 refills | Status: DC

## 2018-01-21 NOTE — Progress Notes (Signed)
CARDIOLOGY CONSULT NOTE  Patient ID: ARLYCE CIRCLE MRN: [id number] DOB/AGE: 05/06/1951 67 y.o.  Admit date: (Not on file) Primary Physician: Mikey Kirschner, MD Referring Physician: Mikey Kirschner, MD  Reason for Consultation: Atrial fibrillation  HPI: Ashlee Solomon is a 67 y.o. female who is being seen today for the evaluation of atrial fibrillation at the request of Luking, Grace Bushy, MD.   She was recently evaluated for a cough and shortness of breath in the ED on 01/18/18.  I reviewed all relevant documentation, labs, and studies.  Chest x-ray was negative for acute cardia pulmonary disease.  ECG which I reviewed demonstrated rate controlled atrial fibrillation.  BNP was slightly elevated at 230.  She was started on renally dosed Xarelto 15 mg daily.  She continues to struggle with a cough and shortness of breath related to this.  She has been on albuterol nebulizers, prednisone, and antibiotics.  Her apple watch demonstrates heart rates ranging from 90-110 bpm.  She denies chest pain, dizziness, palpitations, and syncope.  She has lost 80 pounds in the past 2 years through eating better and exercising.  She denies a history of sleep apnea.  Social history: She is married.  She has 3 sons and several grandchildren.  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   No Known Allergies  Current Outpatient Medications  Medication Sig Dispense Refill  . albuterol (PROVENTIL HFA;VENTOLIN HFA) 108 (90 Base) MCG/ACT inhaler Inhale 2 puffs into the lungs every 4 (four) hours as needed for wheezing. 1 Inhaler 0  . furosemide (LASIX) 20 MG tablet TAKE 1 TABLET BY MOUTH EVERY DAY 90 tablet 1  . HYDROcodone-homatropine (HYCODAN) 5-1.5 MG/5ML syrup Take 5 mLs by mouth every 4 (four) hours as needed. 90 mL 0  . KLOR-CON M10 10 MEQ tablet TAKE 1 TABLET BY MOUTH EVERY DAY 90 tablet 1  . levothyroxine (SYNTHROID, LEVOTHROID) 175 MCG tablet TAKE 1 TABLET BY MOUTH  EVERY DAY IN THE MORNING 90 tablet 3  . loratadine (CLARITIN) 10 MG tablet Take 10 mg by mouth daily with breakfast.     . magnesium oxide (MAG-OX) 400 MG tablet Take 400 mg by mouth at bedtime.     . metoprolol succinate (TOPROL-XL) 50 MG 24 hr tablet TAKE 1 TABLET BY MOUTH DAILY. TAKE WITH OR IMMEDIATELY FOLLOWING A MEAL. 90 tablet 1  . OVER THE COUNTER MEDICATION Vit C 500mg  qd Vit b one qd Vit d3 5000 qd Lutein 10mg  qd coq10 100 qd Butchers broom 2 caps qd    . Potassium 99 MG TABS Take by mouth daily.    . Rivaroxaban (XARELTO) 15 MG TABS tablet Take 1 tablet (15 mg total) by mouth daily with supper. 30 tablet 0   No current facility-administered medications for this visit.     Past Medical History:  Diagnosis Date  . Arthritis   . DDD (degenerative disc disease)   . GERD (gastroesophageal reflux disease)   . Hypertension    clearance Dr Wolfgang Phoenix with note on chart  . Hypothyroidism   . Pneumonia 1/13  . Reactive airway disease     Past Surgical History:  Procedure Laterality Date  . APPENDECTOMY    . CESAREAN SECTION     x 3  . COLONOSCOPY N/A 04/14/2015   Procedure: COLONOSCOPY;  Surgeon: Rogene Houston, MD;  Location[facility];  Service: Endoscopy;  Laterality: N/A;  830 - moved to 11:30 -  Ann to notify pt  . TONSILLECTOMY    . 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 Marital status: Married    Spouse name: Not on file  . Number of children: Not on file  . Years of education: Not on file  . Highest education level: Not on file  Occupational History  . Not on file  Social Needs  . Financial resource strain: Not on file  . Food insecurity:    Worry: Not on file    Inability: Not on file  . Transportation needs:    Medical: Not on file    Non-medical: Not on file  Tobacco Use  .  Smoking status: Former Smoker    Packs/day: 0.25    Types: Cigarettes    Last attempt to quit: 05/25/2007    Years since quitting: 10.6  . Smokeless tobacco: Never Used  Substance and Sexual Activity  . Alcohol use: Yes    Comment: mixed drink nightly  . Drug use: No  . Sexual activity: Not on file  Lifestyle  . Physical activity:    Days per week: Not on file    Minutes per session: Not on file  . Stress: Not on file  Relationships  . Social connections:    Talks on phone: Not on file    Gets together: Not on file    Attends religious service: Not on file    Active member of club or organization: Not on file    Attends meetings of clubs or organizations: Not on file    Relationship status: Not on file  . Intimate partner violence:    Fear of current or ex partner: Not on file    Emotionally abused: Not on file    Physically abused: Not on file    Forced sexual activity: Not on file  Other Topics Concern  . Not on file  Social History Narrative  . Not on file     No family history of premature CAD in 1st degree relatives.  Current Meds  Medication Sig  . albuterol (PROVENTIL HFA;VENTOLIN HFA) 108 (90 Base) MCG/ACT inhaler Inhale 2 puffs into the lungs every 4 (four) hours as needed for wheezing.  . furosemide (LASIX) 20 MG tablet TAKE 1 TABLET BY MOUTH EVERY DAY  . HYDROcodone-homatropine (HYCODAN) 5-1.5 MG/5ML syrup Take 5 mLs by mouth every 4 (four) hours as needed.  Marland Kitchen KLOR-CON M10 10 MEQ tablet TAKE 1 TABLET BY MOUTH EVERY DAY  . levothyroxine (SYNTHROID, LEVOTHROID) 175 MCG tablet TAKE 1 TABLET BY MOUTH EVERY DAY IN THE MORNING  . loratadine (CLARITIN) 10 MG tablet Take 10 mg by mouth daily with breakfast.   . magnesium oxide (MAG-OX) 400 MG tablet Take 400 mg by mouth at bedtime.   . metoprolol succinate (TOPROL-XL) 50 MG 24 hr tablet TAKE 1 TABLET BY MOUTH DAILY. TAKE WITH OR IMMEDIATELY FOLLOWING A MEAL.  Marland Kitchen OVER THE COUNTER MEDICATION Vit C 500mg  qd Vit b one  qd Vit d3 5000 qd Lutein 10mg  qd coq10 100 qd Butchers broom 2 caps qd  . Potassium 99 MG TABS Take by mouth daily.  . Rivaroxaban (XARELTO) 15 MG TABS tablet Take 1 tablet (15 mg total) by mouth daily with supper.      Review of systems complete and found to be negative  unless listed above in HPI    Physical exam Blood pressure 100/69, pulse 90, height 5\' 4"  (1.626 m), weight 222 lb (100.7 kg), SpO2 97 %. General: NAD Neck: No JVD, no thyromegaly or thyroid nodule.  Lungs: Clear to auscultation bilaterally with normal respiratory effort. CV: Nondisplaced PMI. Regular rate and irregular rhythm, normal S1/S2, no S3, no murmur.  No peripheral edema.  No carotid bruit.   Abdomen: Soft, nontender, no distention.  Skin: Intact without lesions or rashes.  Neurologic: Alert and oriented x 3.  Psych: Normal affect. Extremities: No clubbing or cyanosis.  HEENT: Normal.   ECG: Most recent ECG reviewed.   Labs: Lab Results  Component Value Date/Time   K 4.5 01/18/2018 02:36 PM   BUN 22 (H) 01/18/2018 02:36 PM   BUN 17 03/01/2017 09:48 AM   CREATININE 1.19 (H) 01/18/2018 02:36 PM   CREATININE 0.98 09/30/2014 08:03 AM   ALT 17 03/01/2017 09:48 AM   TSH 1.400 03/01/2017 09:48 AM   HGB 12.2 02/21/2016 10:01 AM     Lipids: Lab Results  Component Value Date/Time   LDLCALC 61 03/01/2017 09:48 AM   CHOL 149 03/01/2017 09:48 AM   TRIG 68 03/01/2017 09:48 AM   HDL 74 03/01/2017 09:48 AM        ASSESSMENT AND PLAN:  1.  Atrial fibrillation: Symptomatically stable on metoprolol succinate.  Systemically anticoagulated with renally dose Xarelto 15 mg daily.  I will obtain an echocardiogram to evaluate cardiac structure, function, and left atrial size.  After she has been on Xarelto for several weeks, I would consider elective outpatient cardioversion after reviewing her echocardiogram.  2.  Shortness of breath: She takes Lasix 20 mg daily and occasionally 40 mg with supplemental  potassium.  She is also taking over-the-counter medications for cough.  She has also been on steroids and antibiotics.   Disposition: Follow up in 6-8 weeks.  Signed: Kate Sable, M.D., F.A.C.C.  01/21/2018, 1:48 PM

## 2018-01-21 NOTE — Patient Instructions (Signed)
Medication Instructions:  Continue all current medications.  Labwork: none  Testing/Procedures:  Your physician has requested that you have an echocardiogram. Echocardiography is a painless test that uses sound waves to create images of your heart. It provides your doctor with information about the size and shape of your heart and how well your heart's chambers and valves are working. This procedure takes approximately one hour. There are no restrictions for this procedure.  Office will contact with results via phone or letter.    Follow-Up: 6-8 weeks   Any Other Special Instructions Will Be Listed Below (If Applicable).  If you need a refill on your cardiac medications before your next appointment, please call your pharmacy.  

## 2018-01-21 NOTE — Telephone Encounter (Signed)
Echo scheduled in [facility] January 23, 2018

## 2018-01-23 ENCOUNTER — Ambulatory Visit (INDEPENDENT_AMBULATORY_CARE_PROVIDER_SITE_OTHER): Payer: Medicare Other

## 2018-01-23 ENCOUNTER — Other Ambulatory Visit: Payer: Self-pay

## 2018-01-23 DIAGNOSIS — I4891 Unspecified atrial fibrillation: Secondary | ICD-10-CM | POA: Diagnosis not present

## 2018-01-25 ENCOUNTER — Telehealth: Payer: Self-pay | Admitting: *Deleted

## 2018-01-25 NOTE — Telephone Encounter (Signed)
Notes recorded by Laurine Blazer, LPN on 05/30/7578 at 7:28 PM EDT Patient notified. Copy to pmd. Follow up scheduled for 03/05/2018 with Dr. Bronson Ing.   ------  Notes recorded by Herminio Commons, MD on 01/23/2018 at 4:39 PM EDT Normal cardiac function. Valve leakage noted.

## 2018-02-05 ENCOUNTER — Other Ambulatory Visit: Payer: Self-pay | Admitting: Nurse Practitioner

## 2018-02-09 ENCOUNTER — Other Ambulatory Visit: Payer: Self-pay | Admitting: Family Medicine

## 2018-02-11 ENCOUNTER — Telehealth: Payer: Self-pay | Admitting: Family Medicine

## 2018-02-11 ENCOUNTER — Other Ambulatory Visit: Payer: Self-pay | Admitting: *Deleted

## 2018-02-11 MED ORDER — MELOXICAM 15 MG PO TABS: ORAL_TABLET | ORAL | 0 refills | Status: DC

## 2018-02-11 NOTE — Telephone Encounter (Signed)
Med sent. Pt notified.

## 2018-02-11 NOTE — Telephone Encounter (Signed)
Med not on current med list but under history. Last sent in jan 2019 with 2 refills. Pt seen for wellness 09/10/2017

## 2018-02-11 NOTE — Telephone Encounter (Signed)
Pt is requesting refills on meloxicam (MOBIC) 15 MG tablet    [id number] - [place], [place]

## 2018-02-11 NOTE — Telephone Encounter (Signed)
Ok plus one ref 

## 2018-03-05 ENCOUNTER — Telehealth: Payer: Self-pay | Admitting: Nurse Practitioner

## 2018-03-05 ENCOUNTER — Ambulatory Visit (INDEPENDENT_AMBULATORY_CARE_PROVIDER_SITE_OTHER): Payer: Medicare Other | Admitting: Cardiovascular Disease

## 2018-03-05 ENCOUNTER — Encounter: Payer: Self-pay | Admitting: Cardiovascular Disease

## 2018-03-05 ENCOUNTER — Other Ambulatory Visit: Payer: Self-pay

## 2018-03-05 VITALS — BP 97/67 | HR 81 | Ht 64.0 in | Wt 218.0 lb

## 2018-03-05 DIAGNOSIS — Z79899 Other long term (current) drug therapy: Secondary | ICD-10-CM

## 2018-03-05 DIAGNOSIS — I4891 Unspecified atrial fibrillation: Secondary | ICD-10-CM

## 2018-03-05 DIAGNOSIS — Z13220 Encounter for screening for lipoid disorders: Secondary | ICD-10-CM

## 2018-03-05 NOTE — Progress Notes (Signed)
SUBJECTIVE: The patient presents for follow-up of atrial fibrillation.  Echocardiogram on 01/23/2018 demonstrated normal left ventricular systolic and diastolic function and normal regional wall motion, LVEF 55 to 60%.  There was mild mitral and moderate tricuspid regurgitation.  The right atrium was moderately dilated.  The left atrium was normal in size.  The patient denies any symptoms of chest pain, palpitations, shortness of breath, lightheadedness, dizziness, leg swelling, orthopnea, PND, and syncope.  She is up to walking 2 miles on a near daily basis and also use of the right, bicycle.  She is feeling much better.      Social history: She is married.  She has 3 sons and several grandchildren.  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  Review of Systems: As per "subjective", otherwise negative.  No Known Allergies  Current Outpatient Medications  Medication Sig Dispense Refill  . furosemide (LASIX) 20 MG tablet TAKE 1 TABLET BY MOUTH EVERY DAY 90 tablet 1  . KLOR-CON M10 10 MEQ tablet TAKE 1 TABLET BY MOUTH EVERY DAY 90 tablet 1  . levothyroxine (SYNTHROID, LEVOTHROID) 175 MCG tablet TAKE 1 TABLET BY MOUTH EVERY DAY IN THE MORNING 90 tablet 3  . loratadine (CLARITIN) 10 MG tablet Take 10 mg by mouth daily with breakfast.     . magnesium oxide (MAG-OX) 400 MG tablet Take 400 mg by mouth at bedtime.     . meloxicam (MOBIC) 15 MG tablet TAKE 1 TABLET BY MOUTH EVERY DAY AS NEEDED FOR PAIN 90 tablet 0  . metoprolol succinate (TOPROL-XL) 50 MG 24 hr tablet TAKE 1 TABLET BY MOUTH DAILY. TAKE WITH OR IMMEDIATELY FOLLOWING A MEAL. 90 tablet 1  . OVER THE COUNTER MEDICATION Vit C 500mg  qd Vit b one qd Vit d3 5000 qd Lutein 10mg  qd coq10 100 qd Butchers broom 2 caps qd    . Rivaroxaban (XARELTO) 15 MG TABS tablet Take 1 tablet (15 mg total) by mouth daily with supper. 90 tablet 3  . VENTOLIN HFA 108 (90 Base) MCG/ACT inhaler TAKE 2 PUFFS BY MOUTH EVERY 4  HOURS AS NEEDED FOR WHEEZE 18 g 2   No current facility-administered medications for this visit.     Past Medical History:  Diagnosis Date  . Arthritis   . DDD (degenerative disc disease)   . GERD (gastroesophageal reflux disease)   . Hypertension    clearance Dr Wolfgang Phoenix with note on chart  . Hypothyroidism   . Pneumonia 1/13  . Reactive airway disease     Past Surgical History:  Procedure Laterality Date  . APPENDECTOMY    . CESAREAN SECTION     x 3  . COLONOSCOPY N/A 04/14/2015   Procedure: COLONOSCOPY;  Surgeon: Rogene Houston, MD;  Location: [facility];  Service: Endoscopy;  Laterality: N/A;  830 - moved to 11:30 - Ann to notify pt  . TONSILLECTOMY    . 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 Marital status: Married    Spouse name: Not on file  . Number of children: Not on file  . Years of education: Not on file  . Highest education level: Not on file  Occupational History  . Not on file  Social Needs  . Financial resource strain: Not on file  .  Food insecurity:    Worry: Not on file    Inability: Not on file  . Transportation needs:    Medical: Not on file    Non-medical: Not on file  Tobacco Use  . Smoking status: Former Smoker    Packs/day: 0.25    Types: Cigarettes    Last attempt to quit: 05/25/2007    Years since quitting: 10.7  . Smokeless tobacco: Never Used  Substance and Sexual Activity  . Alcohol use: Yes    Comment: mixed drink nightly  . Drug use: No  . Sexual activity: Not on file  Lifestyle  . Physical activity:    Days per week: Not on file    Minutes per session: Not on file  . Stress: Not on file  Relationships  . Social connections:    Talks on phone: Not on file    Gets together: Not on file    Attends religious service: Not on file    Active member of club  or organization: Not on file    Attends meetings of clubs or organizations: Not on file    Relationship status: Not on file  . Intimate partner violence:    Fear of current or ex partner: Not on file    Emotionally abused: Not on file    Physically abused: Not on file    Forced sexual activity: Not on file  Other Topics Concern  . Not on file  Social History Narrative  . Not on file     Vitals:   03/05/18 0906  BP: 97/67  Pulse: 81  SpO2: 100%  Weight: 218 lb (98.9 kg)  Height: 5\' 4"  (1.626 m)    Wt Readings from Last 3 Encounters:  03/05/18 218 lb (98.9 kg)  01/21/18 222 lb (100.7 kg)  01/14/18 220 lb 6.4 oz (100 kg)     PHYSICAL EXAM General: NAD HEENT: Normal. Neck: No JVD, no thyromegaly. Lungs: Clear to auscultation bilaterally with normal respiratory effort. CV: Regular rate and irregular rhythm, normal S1/S2, no S3, no murmur. No pretibial or periankle edema.  No carotid bruit.   Abdomen: Soft, nontender, no distention.  Neurologic: Alert and oriented.  Psych: Normal affect. Skin: Normal. Musculoskeletal: No gross deformities.    ECG: Most recent ECG reviewed.   Labs: Lab Results  Component Value Date/Time   K 4.5 01/18/2018 02:36 PM   BUN 22 (H) 01/18/2018 02:36 PM   BUN 17 03/01/2017 09:48 AM   CREATININE 1.19 (H) 01/18/2018 02:36 PM   CREATININE 0.98 09/30/2014 08:03 AM   ALT 17 03/01/2017 09:48 AM   TSH 1.400 03/01/2017 09:48 AM   HGB 12.2 02/21/2016 10:01 AM     Lipids: Lab Results  Component Value Date/Time   LDLCALC 61 03/01/2017 09:48 AM   CHOL 149 03/01/2017 09:48 AM   TRIG 68 03/01/2017 09:48 AM   HDL 74 03/01/2017 09:48 AM       ASSESSMENT AND PLAN: 1.  Atrial fibrillation: Symptomatically stable on metoprolol succinate.  Systemically anticoagulated with renally dose Xarelto 15 mg daily.  No changes to therapy.     Disposition: Follow up 6 months   Kate Sable, M.D., F.A.C.C.

## 2018-03-05 NOTE — Patient Instructions (Signed)

## 2018-03-05 NOTE — Telephone Encounter (Signed)
Patient has an appointment on 03/11/18 with Hoyle Sauer.  She wants to know if she is due for labs?

## 2018-03-08 NOTE — Telephone Encounter (Signed)
Met 7, lipid and liver. Thanks.

## 2018-03-08 NOTE — Telephone Encounter (Signed)
Pt notified orders put in.

## 2018-03-11 ENCOUNTER — Encounter: Payer: Self-pay | Admitting: Nurse Practitioner

## 2018-03-11 ENCOUNTER — Other Ambulatory Visit: Payer: Self-pay | Admitting: *Deleted

## 2018-03-11 ENCOUNTER — Ambulatory Visit (INDEPENDENT_AMBULATORY_CARE_PROVIDER_SITE_OTHER): Payer: Medicare Other | Admitting: Nurse Practitioner

## 2018-03-11 VITALS — BP 112/72 | Ht 64.0 in | Wt 215.4 lb

## 2018-03-11 DIAGNOSIS — E039 Hypothyroidism, unspecified: Secondary | ICD-10-CM | POA: Diagnosis not present

## 2018-03-11 DIAGNOSIS — Z13220 Encounter for screening for lipoid disorders: Secondary | ICD-10-CM | POA: Diagnosis not present

## 2018-03-11 DIAGNOSIS — Z79899 Other long term (current) drug therapy: Secondary | ICD-10-CM | POA: Diagnosis not present

## 2018-03-11 DIAGNOSIS — R609 Edema, unspecified: Secondary | ICD-10-CM

## 2018-03-11 NOTE — Progress Notes (Signed)
Subjective: Presents for recheck on her hypothyroidism.  Compliant with medication.  Had her blood drawn this morning but a TSH was not included.  Saw cardiologist on 5/14 for her A. fib, symptoms are stable.  Denies any chest pain/ischemic type pain or shortness of breath.  States she feels much better overall.  Taking Xarelto daily.  Also wants to continue her furosemide and potassium for her edema.  Has occasional brief tachycardia, no symptoms.  States the only reason she knows it happens is because her heart rate monitor will go off on her watch.  Objective:   BP 112/72   Ht 5\' 4"  (1.626 m)   Wt 215 lb 6.4 oz (97.7 kg)   BMI 36.97 kg/m  NAD.  Alert, oriented.  Lungs clear.  Heart slightly irregular rate.  Carotids no bruits or thrills.  Lower extremities no edema.  Assessment:   Problem List Items Addressed This Visit      Endocrine   Hypothyroidism - Primary     Other   Peripheral edema       Plan: Continue current medication regimen as directed.  Defers need for refills at this time.  Encourage continued walking program as well as participation in [employer].  Our nurse call the lab to add a TSH to the ones she had drawn this morning.  Return in about 6 months (around 09/11/2018) for physical.

## 2018-03-12 LAB — HEPATIC FUNCTION PANEL
ALBUMIN: 4.5 g/dL (ref 3.6–4.8)
ALK PHOS: 63 IU/L (ref 39–117)
ALT: 14 IU/L (ref 0–32)
AST: 30 IU/L (ref 0–40)
BILIRUBIN TOTAL: 0.4 mg/dL (ref 0.0–1.2)
BILIRUBIN, DIRECT: 0.15 mg/dL (ref 0.00–0.40)
Total Protein: 6.6 g/dL (ref 6.0–8.5)

## 2018-03-12 LAB — BASIC METABOLIC PANEL
BUN / CREAT RATIO: 28 (ref 12–28)
BUN: 26 mg/dL (ref 8–27)
CO2: 24 mmol/L (ref 20–29)
Calcium: 9.8 mg/dL (ref 8.7–10.3)
Chloride: 101 mmol/L (ref 96–106)
Creatinine, Ser: 0.93 mg/dL (ref 0.57–1.00)
GFR, EST AFRICAN AMERICAN: 73 mL/min/{1.73_m2} (ref >59)
GFR, EST NON AFRICAN AMERICAN: 63 mL/min/{1.73_m2} (ref >59)
Glucose: 87 mg/dL (ref 65–99)
Potassium: 4.5 mmol/L (ref 3.5–5.2)
SODIUM: 141 mmol/L (ref 134–144)

## 2018-03-12 LAB — LIPID PANEL
Chol/HDL Ratio: 1.8 ratio (ref 0.0–4.4)
Cholesterol, Total: 147 mg/dL (ref 100–199)
HDL: 80 mg/dL (ref >39)
LDL Calculated: 55 mg/dL (ref 0–99)
Triglycerides: 61 mg/dL (ref 0–149)
VLDL Cholesterol Cal: 12 mg/dL (ref 5–40)

## 2018-03-12 LAB — TSH: TSH: 0.361 u[IU]/mL — ABNORMAL LOW (ref 0.450–4.500)

## 2018-03-13 ENCOUNTER — Other Ambulatory Visit: Payer: Self-pay | Admitting: Nurse Practitioner

## 2018-03-13 ENCOUNTER — Encounter: Payer: Self-pay | Admitting: Nurse Practitioner

## 2018-03-13 MED ORDER — LEVOTHYROXINE SODIUM 150 MCG PO TABS: 150.0000 ug | ORAL_TABLET | Freq: Every day | ORAL | 0 refills | Status: DC

## 2018-03-15 ENCOUNTER — Encounter: Payer: Self-pay | Admitting: Nurse Practitioner

## 2018-05-01 DIAGNOSIS — M1711 Unilateral primary osteoarthritis, right knee: Secondary | ICD-10-CM | POA: Diagnosis not present

## 2018-05-09 ENCOUNTER — Other Ambulatory Visit: Payer: Self-pay | Admitting: Nurse Practitioner

## 2018-05-10 ENCOUNTER — Other Ambulatory Visit: Payer: Self-pay | Admitting: Family Medicine

## 2018-05-28 ENCOUNTER — Other Ambulatory Visit: Payer: Self-pay | Admitting: Nurse Practitioner

## 2018-06-05 ENCOUNTER — Other Ambulatory Visit: Payer: Self-pay | Admitting: Family Medicine

## 2018-06-05 DIAGNOSIS — Z1231 Encounter for screening mammogram for malignant neoplasm of breast: Secondary | ICD-10-CM

## 2018-06-05 DIAGNOSIS — E039 Hypothyroidism, unspecified: Secondary | ICD-10-CM | POA: Diagnosis not present

## 2018-06-06 ENCOUNTER — Other Ambulatory Visit: Payer: Self-pay | Admitting: *Deleted

## 2018-06-06 LAB — TSH: TSH: 2.43 u[IU]/mL (ref 0.450–4.500)

## 2018-06-06 MED ORDER — LEVOTHYROXINE SODIUM 150 MCG PO TABS: 150.0000 ug | ORAL_TABLET | Freq: Every day | ORAL | 0 refills | Status: DC

## 2018-06-10 ENCOUNTER — Ambulatory Visit (HOSPITAL_COMMUNITY)
Admission: RE | Admit: 2018-06-10 | Discharge: 2018-06-10 | Disposition: A | Discharge status: Home / Self Care | Payer: Medicare Other | Source: Ambulatory Visit | Attending: Family Medicine | Admitting: Family Medicine

## 2018-06-10 DIAGNOSIS — Z1231 Encounter for screening mammogram for malignant neoplasm of breast: Secondary | ICD-10-CM | POA: Diagnosis not present

## 2018-07-01 ENCOUNTER — Other Ambulatory Visit: Payer: Self-pay

## 2018-07-01 MED ORDER — POTASSIUM CHLORIDE CRYS ER 10 MEQ PO TBCR: 10.0000 meq | EXTENDED_RELEASE_TABLET | Freq: Every day | ORAL | 1 refills | Status: DC

## 2018-07-01 MED ORDER — FUROSEMIDE 20 MG PO TABS: 20.0000 mg | ORAL_TABLET | Freq: Every day | ORAL | 1 refills | Status: DC

## 2018-07-03 ENCOUNTER — Telehealth: Payer: Self-pay | Admitting: Family Medicine

## 2018-07-03 DIAGNOSIS — M1711 Unilateral primary osteoarthritis, right knee: Secondary | ICD-10-CM | POA: Diagnosis not present

## 2018-07-03 NOTE — Telephone Encounter (Signed)
Patient calling for refills on 2 medications that were sent to the pharmacy on 07-01-18.  I verified the name of the pharmacy and told her to call them and check because refills were sent already. Pt understood.

## 2018-07-10 DIAGNOSIS — M1711 Unilateral primary osteoarthritis, right knee: Secondary | ICD-10-CM | POA: Diagnosis not present

## 2018-07-17 DIAGNOSIS — M1711 Unilateral primary osteoarthritis, right knee: Secondary | ICD-10-CM | POA: Diagnosis not present

## 2018-08-03 ENCOUNTER — Other Ambulatory Visit: Payer: Self-pay | Admitting: Family Medicine

## 2018-08-30 ENCOUNTER — Other Ambulatory Visit: Payer: Self-pay | Admitting: Family Medicine

## 2018-08-30 ENCOUNTER — Telehealth: Payer: Self-pay | Admitting: Family Medicine

## 2018-08-30 DIAGNOSIS — Z13220 Encounter for screening for lipoid disorders: Secondary | ICD-10-CM

## 2018-08-30 DIAGNOSIS — Z0000 Encounter for general adult medical examination without abnormal findings: Secondary | ICD-10-CM

## 2018-08-30 DIAGNOSIS — Z79899 Other long term (current) drug therapy: Secondary | ICD-10-CM

## 2018-08-30 DIAGNOSIS — E039 Hypothyroidism, unspecified: Secondary | ICD-10-CM

## 2018-08-30 NOTE — Telephone Encounter (Signed)
Last had labs drawn 03/11/2018 Bmet,lipid,hepatic fx,tsh. Please advise.

## 2018-08-30 NOTE — Telephone Encounter (Signed)
Lab orders placed and pt is aware 

## 2018-08-30 NOTE — Telephone Encounter (Signed)
Pt has CPE scheduled 11/20. She would like to know if Dr. Richardson Landry would like her to have lab work done before appt since they have changed some of her medications.

## 2018-08-30 NOTE — Telephone Encounter (Signed)
Lip liv m7 tsh cbc

## 2018-09-03 ENCOUNTER — Other Ambulatory Visit: Payer: Self-pay | Admitting: Family Medicine

## 2018-09-06 DIAGNOSIS — Z13220 Encounter for screening for lipoid disorders: Secondary | ICD-10-CM | POA: Diagnosis not present

## 2018-09-06 DIAGNOSIS — E039 Hypothyroidism, unspecified: Secondary | ICD-10-CM | POA: Diagnosis not present

## 2018-09-06 DIAGNOSIS — Z0000 Encounter for general adult medical examination without abnormal findings: Secondary | ICD-10-CM | POA: Diagnosis not present

## 2018-09-06 DIAGNOSIS — Z79899 Other long term (current) drug therapy: Secondary | ICD-10-CM | POA: Diagnosis not present

## 2018-09-07 LAB — LIPID PANEL
CHOL/HDL RATIO: 1.8 ratio (ref 0.0–4.4)
Cholesterol, Total: 160 mg/dL (ref 100–199)
HDL: 88 mg/dL (ref >39)
LDL Calculated: 59 mg/dL (ref 0–99)
TRIGLYCERIDES: 67 mg/dL (ref 0–149)
VLDL Cholesterol Cal: 13 mg/dL (ref 5–40)

## 2018-09-07 LAB — BASIC METABOLIC PANEL
BUN/Creatinine Ratio: 17 (ref 12–28)
BUN: 17 mg/dL (ref 8–27)
CALCIUM: 10.1 mg/dL (ref 8.7–10.3)
CO2: 25 mmol/L (ref 20–29)
Chloride: 102 mmol/L (ref 96–106)
Creatinine, Ser: 1 mg/dL (ref 0.57–1.00)
GFR calc Af Amer: 66 mL/min/{1.73_m2} (ref >59)
GFR, EST NON AFRICAN AMERICAN: 58 mL/min/{1.73_m2} — AB (ref >59)
GLUCOSE: 90 mg/dL (ref 65–99)
POTASSIUM: 4.6 mmol/L (ref 3.5–5.2)
SODIUM: 141 mmol/L (ref 134–144)

## 2018-09-07 LAB — HEPATIC FUNCTION PANEL
ALBUMIN: 4.4 g/dL (ref 3.6–4.8)
ALT: 19 IU/L (ref 0–32)
AST: 30 IU/L (ref 0–40)
Alkaline Phosphatase: 70 IU/L (ref 39–117)
BILIRUBIN TOTAL: 0.5 mg/dL (ref 0.0–1.2)
Bilirubin, Direct: 0.19 mg/dL (ref 0.00–0.40)
TOTAL PROTEIN: 6.5 g/dL (ref 6.0–8.5)

## 2018-09-07 LAB — CBC WITH DIFFERENTIAL/PLATELET
BASOS: 1 %
Basophils Absolute: 0.1 10*3/uL (ref 0.0–0.2)
EOS (ABSOLUTE): 0.2 10*3/uL (ref 0.0–0.4)
EOS: 3 %
Hematocrit: 39.1 % (ref 34.0–46.6)
Hemoglobin: 13.2 g/dL (ref 11.1–15.9)
IMMATURE GRANS (ABS): 0 10*3/uL (ref 0.0–0.1)
IMMATURE GRANULOCYTES: 0 %
LYMPHS: 44 %
Lymphocytes Absolute: 2.7 10*3/uL (ref 0.7–3.1)
MCH: 30.1 pg (ref 26.6–33.0)
MCHC: 33.8 g/dL (ref 31.5–35.7)
MCV: 89 fL (ref 79–97)
MONOS ABS: 0.5 10*3/uL (ref 0.1–0.9)
Monocytes: 9 %
NEUTROS PCT: 43 %
Neutrophils Absolute: 2.7 10*3/uL (ref 1.4–7.0)
PLATELETS: 233 10*3/uL (ref 150–450)
RBC: 4.39 x10E6/uL (ref 3.77–5.28)
RDW: 12.8 % (ref 12.3–15.4)
WBC: 6.2 10*3/uL (ref 3.4–10.8)

## 2018-09-07 LAB — TSH: TSH: 4.05 u[IU]/mL (ref 0.450–4.500)

## 2018-09-11 ENCOUNTER — Ambulatory Visit (INDEPENDENT_AMBULATORY_CARE_PROVIDER_SITE_OTHER): Payer: Medicare Other | Admitting: Family Medicine

## 2018-09-11 ENCOUNTER — Encounter: Payer: Self-pay | Admitting: Family Medicine

## 2018-09-11 ENCOUNTER — Ambulatory Visit: Payer: Medicare Other | Admitting: Cardiovascular Disease

## 2018-09-11 VITALS — BP 110/76 | Ht 62.75 in | Wt 209.0 lb

## 2018-09-11 DIAGNOSIS — R609 Edema, unspecified: Secondary | ICD-10-CM | POA: Diagnosis not present

## 2018-09-11 DIAGNOSIS — E039 Hypothyroidism, unspecified: Secondary | ICD-10-CM | POA: Diagnosis not present

## 2018-09-11 DIAGNOSIS — Z23 Encounter for immunization: Secondary | ICD-10-CM | POA: Diagnosis not present

## 2018-09-11 DIAGNOSIS — Z0000 Encounter for general adult medical examination without abnormal findings: Secondary | ICD-10-CM | POA: Diagnosis not present

## 2018-09-11 MED ORDER — METOPROLOL SUCCINATE ER 50 MG PO TB24: ORAL_TABLET | ORAL | 3 refills | Status: DC

## 2018-09-11 MED ORDER — LEVOTHYROXINE SODIUM 150 MCG PO TABS: ORAL_TABLET | ORAL | 3 refills | Status: DC

## 2018-09-11 MED ORDER — POTASSIUM CHLORIDE CRYS ER 10 MEQ PO TBCR: 10.0000 meq | EXTENDED_RELEASE_TABLET | Freq: Every day | ORAL | 3 refills | Status: DC

## 2018-09-11 MED ORDER — FUROSEMIDE 20 MG PO TABS: 20.0000 mg | ORAL_TABLET | Freq: Every day | ORAL | 3 refills | Status: DC

## 2018-09-11 NOTE — Progress Notes (Signed)
Subjective:    Patient ID: Ashlee Solomon, female    DOB: December 17, 1950, 67 y.o.   MRN: [id number]  HPI AWV- Annual Wellness Visit  The patient was seen for their annual wellness visit. The patient's past medical history, surgical history, and family history were reviewed. Pertinent vaccines were reviewed ( tetanus, pneumonia, shingles, flu) The patient's medication list was reviewed and updated.  The height and weight were entered.  BMI recorded in electronic record elsewhere  Cognitive screening was completed. Outcome of Mini - Cog: pass   Falls /depression screening electronically recorded within record elsewhere  Current tobacco usage: none (All patients who use tobacco were given written and verbal information on quitting)  Recent listing of emergency department/hospitalizations over the past year were reviewed.  current specialist the patient sees on a regular basis: Dr. Bronson Ing - a fib, Dr Binnie Rail - ortho for right knee   Medicare annual wellness visit patient questionnaire was reviewed.  A written screening schedule for the patient for the next 5-10 years was given. Appropriate discussion of followup regarding next visit was discussed.  Would like a flu vaccine today.   Declines pelvic exam. Does want breast exam.   Pt states daughter in law states she has trouble hearing. Pt does not think she has any trouble hearing. Did hearing screen in office and pt passed.   Marland Kitchen Results for orders placed or performed in visit on 08/30/18  CBC with Differential  Result Value Ref Range   WBC 6.2 3.4 - 10.8 x10E3/uL   RBC 4.39 3.77 - 5.28 x10E6/uL   Hemoglobin 13.2 11.1 - 15.9 g/dL   Hematocrit 39.1 34.0 - 46.6 %   MCV 89 79 - 97 fL   MCH 30.1 26.6 - 33.0 pg   MCHC 33.8 31.5 - 35.7 g/dL   RDW 12.8 12.3 - 15.4 %   Platelets 233 150 - 450 x10E3/uL   Neutrophils 43 Not Estab. %   Lymphs 44 Not Estab. %   Monocytes 9 Not Estab. %   Eos 3 Not Estab. %   Basos 1 Not Estab. %     Neutrophils Absolute 2.7 1.4 - 7.0 x10E3/uL   Lymphocytes Absolute 2.7 0.7 - 3.1 x10E3/uL   Monocytes Absolute 0.5 0.1 - 0.9 x10E3/uL   EOS (ABSOLUTE) 0.2 0.0 - 0.4 x10E3/uL   Basophils Absolute 0.1 0.0 - 0.2 x10E3/uL   Immature Granulocytes 0 Not Estab. %   Immature Grans (Abs) 0.0 0.0 - 0.1 x10E3/uL  TSH  Result Value Ref Range   TSH 4.050 0.450 - 4.500 uIU/mL  Basic Metabolic Panel (BMET)  Result Value Ref Range   Glucose 90 65 - 99 mg/dL   BUN 17 8 - 27 mg/dL   Creatinine, Ser 1.00 0.57 - 1.00 mg/dL   GFR calc non Af Amer 58 (L) >59 mL/min/1.73   GFR calc Af Amer 66 >59 mL/min/1.73   BUN/Creatinine Ratio 17 12 - 28   Sodium 141 134 - 144 mmol/L   Potassium 4.6 3.5 - 5.2 mmol/L   Chloride 102 96 - 106 mmol/L   CO2 25 20 - 29 mmol/L   Calcium 10.1 8.7 - 10.3 mg/dL  Hepatic function panel  Result Value Ref Range   Total Protein 6.5 6.0 - 8.5 g/dL   Albumin 4.4 3.6 - 4.8 g/dL   Bilirubin Total 0.5 0.0 - 1.2 mg/dL   Bilirubin, Direct 0.19 0.00 - 0.40 mg/dL   Alkaline Phosphatase 70 39 - 117 IU/L  AST 30 0 - 40 IU/L   ALT 19 0 - 32 IU/L  Lipid Profile  Result Value Ref Range   Cholesterol, Total 160 100 - 199 mg/dL   Triglycerides 67 0 - 149 mg/dL   HDL 88 >39 mg/dL   VLDL Cholesterol Cal 13 5 - 40 mg/dL   LDL Calculated 59 0 - 99 mg/dL   Chol/HDL Ratio 1.8 0.0 - 4.4 ratio    Wears the apple watch and with cocas runs of rapid heart rate  Takes meds faithfully.  Generally does not miss her thyroid dose.  No symptoms of high or low thyroid.  Two I nost every day   Diet   On weight watchers diet Review of Systems  Constitutional: Negative for activity change, appetite change and fatigue.  HENT: Negative for congestion and rhinorrhea.   Eyes: Negative for discharge.  Respiratory: Negative for cough, chest tightness and wheezing.   Cardiovascular: Negative for chest pain.  Gastrointestinal: Negative for abdominal pain, blood in stool and vomiting.  Endocrine:  Negative for polyphagia.  Genitourinary: Negative for difficulty urinating and frequency.  Musculoskeletal: Negative for neck pain.  Skin: Negative for color change.  Allergic/Immunologic: Negative for environmental allergies and food allergies.  Neurological: Negative for weakness and headaches.  Psychiatric/Behavioral: Negative for agitation and behavioral problems.  All other systems reviewed and are negative.      Objective:   Physical Exam  Constitutional: She is oriented to person, place, and time. She appears well-developed and well-nourished.  HENT:  Head: Normocephalic.  Right Ear: External ear normal.  Left Ear: External ear normal.  Eyes: Pupils are equal, round, and reactive to light.  Neck: Normal range of motion. No thyromegaly present.  Cardiovascular: Normal rate, regular rhythm, normal heart sounds and intact distal pulses.  No murmur heard. Pulmonary/Chest: Effort normal and breath sounds normal. No respiratory distress. She has no wheezes.  Abdominal: Soft. Bowel sounds are normal. She exhibits no distension and no mass. There is no tenderness.  Musculoskeletal: Normal range of motion. She exhibits no edema or tenderness.  Lymphadenopathy:    She has no cervical adenopathy.  Neurological: She is alert and oriented to person, place, and time. She exhibits normal muscle tone.  Skin: Skin is warm and dry.  Psychiatric: She has a normal mood and affect. Her behavior is normal.   Breast exam within normal limits.  Patient declines pelvic exam       Assessment & Plan:  Impression wellness exam.  Diet discussed.  Mammogram discussed and up-to-date.  Exercise discussed.  Vaccines discussed.  Next colon due 2026  #2 hypothyroidism.  TSH good control.  Discussed to maintain same meds  #3  History of peripheral edema.  Lasix definitely helps it.  Medications refilled.  Flu shot given.  Diet discussed exercise discussed.  Blood work discussed.  Follow-up as  scheduled

## 2018-11-08 ENCOUNTER — Other Ambulatory Visit: Payer: Self-pay | Admitting: Family Medicine

## 2018-11-10 NOTE — Telephone Encounter (Signed)
This is Dr. Jeannine Kitten patient thank you

## 2018-12-04 ENCOUNTER — Ambulatory Visit (INDEPENDENT_AMBULATORY_CARE_PROVIDER_SITE_OTHER): Payer: Medicare Other | Admitting: Cardiovascular Disease

## 2018-12-04 ENCOUNTER — Encounter: Payer: Self-pay | Admitting: Cardiovascular Disease

## 2018-12-04 VITALS — BP 88/59 | HR 80 | Ht 64.0 in | Wt 213.6 lb

## 2018-12-04 DIAGNOSIS — I4821 Permanent atrial fibrillation: Secondary | ICD-10-CM | POA: Diagnosis not present

## 2018-12-04 MED ORDER — RIVAROXABAN 15 MG PO TABS: 15.0000 mg | ORAL_TABLET | Freq: Every day | ORAL | 3 refills | Status: DC

## 2018-12-04 NOTE — Progress Notes (Signed)
SUBJECTIVE: The patient presents for follow-up of atrial fibrillation.  Echocardiogram on 01/23/2018 demonstrated normal left ventricular systolic and diastolic function and normal regional wall motion, LVEF 55 to 60%.  There was mild mitral and moderate tricuspid regurgitation.  The right atrium was moderately dilated. The left atrium was normal in size.  The patient denies any symptoms of chest pain, shortness of breath, lightheadedness, dizziness, orthopnea, PND, and syncope.  She very seldom has palpitations.  She takes Lasix 20 mg daily and sometimes takes an extra dose in the evenings of her ankles and feet are swollen.   Social history: She is married. She has 3 sons and several grandchildren. She is a retired Equities trader. She worked from [id number].  Review of Systems: As per "subjective", otherwise negative.  No Known Allergies  Current Outpatient Medications  Medication Sig Dispense Refill  . furosemide (LASIX) 20 MG tablet Take 1 tablet (20 mg total) by mouth daily. 90 tablet 3  . levothyroxine (SYNTHROID, LEVOTHROID) 150 MCG tablet TAKE 1 TABLET BY MOUTH DAILY BEFORE BREAKFAST 90 tablet 3  . loratadine (CLARITIN) 10 MG tablet Take 10 mg by mouth daily with breakfast.     . magnesium oxide (MAG-OX) 400 MG tablet Take 400 mg by mouth at bedtime.     . meloxicam (MOBIC) 15 MG tablet TAKE 1 TABLET BY MOUTH EVERY DAY AS NEEDED FOR PAIN 90 tablet 0  . metoprolol succinate (TOPROL-XL) 50 MG 24 hr tablet TAKE 1 TABLET BY MOUTH DAILY. TAKE WITH OR IMMEDIATELY FOLLOWING A MEAL. 90 tablet 3  . OVER THE COUNTER MEDICATION Vit C 500mg  qd Vit b one qd Vit d3 5000 qd Lutein 10mg  qd coq10 100 qd Butchers broom 2 caps qd    . potassium chloride (KLOR-CON M10) 10 MEQ tablet Take 1 tablet (10 mEq total) by mouth daily. 90 tablet 3  . Rivaroxaban (XARELTO) 15 MG TABS tablet Take 1 tablet (15 mg total) by mouth daily with supper. 90 tablet 3  . VENTOLIN HFA 108 (90 Base) MCG/ACT  inhaler TAKE 2 PUFFS BY MOUTH EVERY 4 HOURS AS NEEDED FOR WHEEZE 18 g 2   No current facility-administered medications for this visit.     Past Medical History:  Diagnosis Date  . Arthritis   . DDD (degenerative disc disease)   . GERD (gastroesophageal reflux disease)   . Hypertension    clearance Dr Wolfgang Phoenix with note on chart  . Hypothyroidism   . Pneumonia 1/13  . Reactive airway disease     Past Surgical History:  Procedure Laterality Date  . APPENDECTOMY    . CESAREAN SECTION     x 3  . COLONOSCOPY N/A 04/14/2015   Procedure: COLONOSCOPY;  Surgeon: Rogene Houston, MD;  Location: [facility];  Service: Endoscopy;  Laterality: N/A;  830 - moved to 11:30 - Ann to notify pt  . TONSILLECTOMY    . 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 Marital status: Married    Spouse name: Not on file  . Number of children: Not on file  . Years of education: Not on file  . Highest education level: Not on file  Occupational History  . Not on file  Social Needs  . Financial resource strain: Not on file  . Food insecurity:  Worry: Not on file    Inability: Not on file  . Transportation needs:    Medical: Not on file    Non-medical: Not on file  Tobacco Use  . Smoking status: Former Smoker    Packs/day: 0.25    Types: Cigarettes    Last attempt to quit: 05/25/2007    Years since quitting: 11.5  . Smokeless tobacco: Never Used  Substance and Sexual Activity  . Alcohol use: Yes    Comment: mixed drink nightly  . Drug use: No  . Sexual activity: Not on file  Lifestyle  . Physical activity:    Days per week: Not on file    Minutes per session: Not on file  . Stress: Not on file  Relationships  . Social connections:    Talks on phone: Not on file    Gets together: Not on file    Attends religious service:  Not on file    Active member of club or organization: Not on file    Attends meetings of clubs or organizations: Not on file    Relationship status: Not on file  . Intimate partner violence:    Fear of current or ex partner: Not on file    Emotionally abused: Not on file    Physically abused: Not on file    Forced sexual activity: Not on file  Other Topics Concern  . Not on file  Social History Narrative  . Not on file     Vitals:   12/04/18 1147 12/04/18 1152  BP: (!) 89/56 (!) 88/59  Pulse: 74 80  SpO2: 99% 98%  Weight: 213 lb 9.6 oz (96.9 kg)   Height: 5\' 4"  (1.626 m)     Wt Readings from Last 3 Encounters:  12/04/18 213 lb 9.6 oz (96.9 kg)  09/11/18 209 lb (94.8 kg)  03/11/18 215 lb 6.4 oz (97.7 kg)     PHYSICAL EXAM General: NAD HEENT: Normal. Neck: No JVD, no thyromegaly. Lungs: Clear to auscultation bilaterally with normal respiratory effort. CV: Regular rate and irregular rhythm, normal S1/S2, no S3, no murmur. No pretibial or periankle edema.  No carotid bruit.   Abdomen: Soft, nontender, no distention.  Neurologic: Alert and oriented.  Psych: Normal affect. Skin: Normal. Musculoskeletal: No gross deformities.    ECG: Reviewed above under Subjective   Labs: Lab Results  Component Value Date/Time   K 4.6 09/06/2018 10:47 AM   BUN 17 09/06/2018 10:47 AM   CREATININE 1.00 09/06/2018 10:47 AM   CREATININE 0.98 09/30/2014 08:03 AM   ALT 19 09/06/2018 10:47 AM   TSH 4.050 09/06/2018 10:47 AM   HGB 13.2 09/06/2018 10:47 AM     Lipids: Lab Results  Component Value Date/Time   LDLCALC 59 09/06/2018 10:47 AM   CHOL 160 09/06/2018 10:47 AM   TRIG 67 09/06/2018 10:47 AM   HDL 88 09/06/2018 10:47 AM       ASSESSMENT AND PLAN: 1.  Permanent atrial fibrillation: Symptomatically stable on metoprolol succinate 50 mg daily. Systemically anticoagulated with renally dose Xarelto 15 mg daily.  No changes to therapy.    Disposition: Follow up 1  year   Kate Sable, M.D., F.A.C.C.

## 2018-12-04 NOTE — Patient Instructions (Addendum)

## 2018-12-13 DIAGNOSIS — M1711 Unilateral primary osteoarthritis, right knee: Secondary | ICD-10-CM | POA: Diagnosis not present

## 2018-12-13 DIAGNOSIS — M1712 Unilateral primary osteoarthritis, left knee: Secondary | ICD-10-CM | POA: Diagnosis not present

## 2018-12-13 DIAGNOSIS — M170 Bilateral primary osteoarthritis of knee: Secondary | ICD-10-CM | POA: Diagnosis not present

## 2019-02-03 ENCOUNTER — Other Ambulatory Visit: Payer: Self-pay | Admitting: Family Medicine

## 2019-03-13 DIAGNOSIS — M1711 Unilateral primary osteoarthritis, right knee: Secondary | ICD-10-CM | POA: Diagnosis not present

## 2019-03-19 ENCOUNTER — Telehealth: Payer: Self-pay | Admitting: Family Medicine

## 2019-03-19 NOTE — Telephone Encounter (Signed)
Pharmacy requesting refill on Ventolin HFA 90 MCG inhaler. Take 2 puffs by mouth every 4 hours prn wheeze. Pt last seen 09/11/18 for wellness

## 2019-03-20 ENCOUNTER — Other Ambulatory Visit: Payer: Self-pay | Admitting: *Deleted

## 2019-03-20 MED ORDER — ALBUTEROL SULFATE HFA 108 (90 BASE) MCG/ACT IN AERS: INHALATION_SPRAY | RESPIRATORY_TRACT | 2 refills | Status: DC

## 2019-03-20 NOTE — Telephone Encounter (Signed)
Refills sent. Please call pt and schedule office virtual visit for 6 month med check.

## 2019-03-20 NOTE — Telephone Encounter (Signed)
Ok plus two ref, rec six mo ck upvirtual

## 2019-03-21 DIAGNOSIS — M1711 Unilateral primary osteoarthritis, right knee: Secondary | ICD-10-CM | POA: Diagnosis not present

## 2019-03-21 NOTE — Telephone Encounter (Signed)
Patient put on recall list for wellness plus chronic

## 2019-03-28 DIAGNOSIS — M1711 Unilateral primary osteoarthritis, right knee: Secondary | ICD-10-CM | POA: Diagnosis not present

## 2019-04-30 ENCOUNTER — Other Ambulatory Visit: Payer: Self-pay | Admitting: Family Medicine

## 2019-05-23 ENCOUNTER — Other Ambulatory Visit: Payer: Self-pay

## 2019-05-30 DIAGNOSIS — H43812 Vitreous degeneration, left eye: Secondary | ICD-10-CM | POA: Diagnosis not present

## 2019-05-30 DIAGNOSIS — H524 Presbyopia: Secondary | ICD-10-CM | POA: Diagnosis not present

## 2019-05-30 DIAGNOSIS — H52223 Regular astigmatism, bilateral: Secondary | ICD-10-CM | POA: Diagnosis not present

## 2019-05-30 DIAGNOSIS — H5203 Hypermetropia, bilateral: Secondary | ICD-10-CM | POA: Diagnosis not present

## 2019-05-30 DIAGNOSIS — H25813 Combined forms of age-related cataract, bilateral: Secondary | ICD-10-CM | POA: Diagnosis not present

## 2019-07-21 ENCOUNTER — Telehealth: Payer: Self-pay | Admitting: Family Medicine

## 2019-07-21 NOTE — Telephone Encounter (Signed)
Last visit 09/11/18 (wellness)

## 2019-07-21 NOTE — Telephone Encounter (Signed)
Pt needs refill on her albuterol (VENTOLIN HFA) 108 (90 Base) MCG/ACT inhaler   Been using recently at night to help with her asthma  Please advise & call pt when done    [facility] [facility]

## 2019-07-21 NOTE — Telephone Encounter (Signed)
Ok plus 3 ref 

## 2019-07-22 ENCOUNTER — Other Ambulatory Visit: Payer: Self-pay

## 2019-07-22 ENCOUNTER — Other Ambulatory Visit (INDEPENDENT_AMBULATORY_CARE_PROVIDER_SITE_OTHER): Payer: Medicare Other | Admitting: *Deleted

## 2019-07-22 DIAGNOSIS — Z23 Encounter for immunization: Secondary | ICD-10-CM | POA: Diagnosis not present

## 2019-07-22 MED ORDER — ALBUTEROL SULFATE HFA 108 (90 BASE) MCG/ACT IN AERS: INHALATION_SPRAY | RESPIRATORY_TRACT | 2 refills | Status: DC

## 2019-07-22 NOTE — Telephone Encounter (Signed)
Med sent to pharm. Will call and notify pt when a phone line becomes available to use.

## 2019-07-22 NOTE — Telephone Encounter (Signed)
Left message to return call 

## 2019-07-22 NOTE — Telephone Encounter (Signed)
Pt.notified

## 2019-07-24 ENCOUNTER — Other Ambulatory Visit: Payer: Self-pay | Admitting: Family Medicine

## 2019-07-25 ENCOUNTER — Other Ambulatory Visit (HOSPITAL_COMMUNITY): Payer: Self-pay | Admitting: Family Medicine

## 2019-07-25 DIAGNOSIS — Z1231 Encounter for screening mammogram for malignant neoplasm of breast: Secondary | ICD-10-CM

## 2019-08-24 IMAGING — DX DG CHEST 2V
2 series · 2 of 2 positions shown · non-contrast
Comparison: Chest x-ray 05/24/2012.

CLINICAL DATA: Productive cough.

EXAM:
CHEST - 2 VIEW

[chest pa]
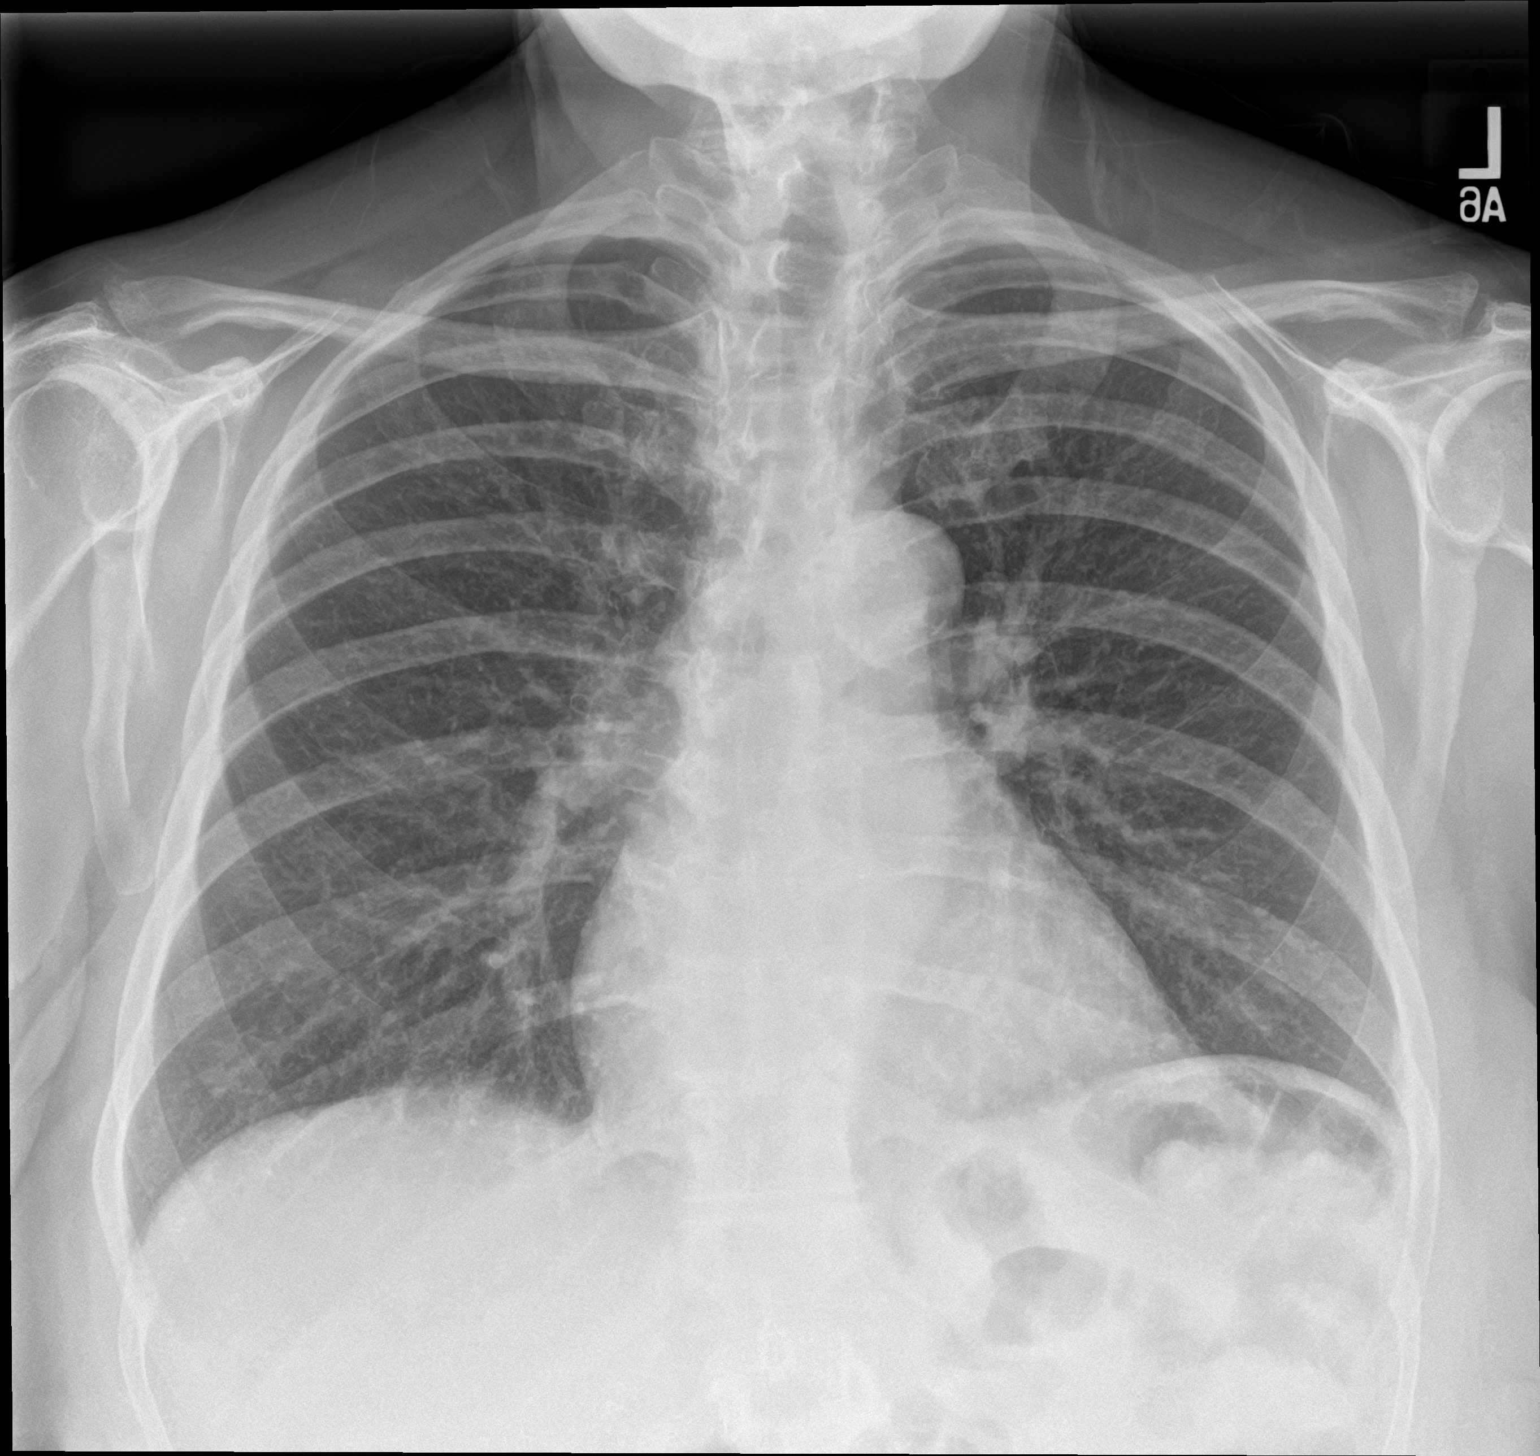

[chest lat]
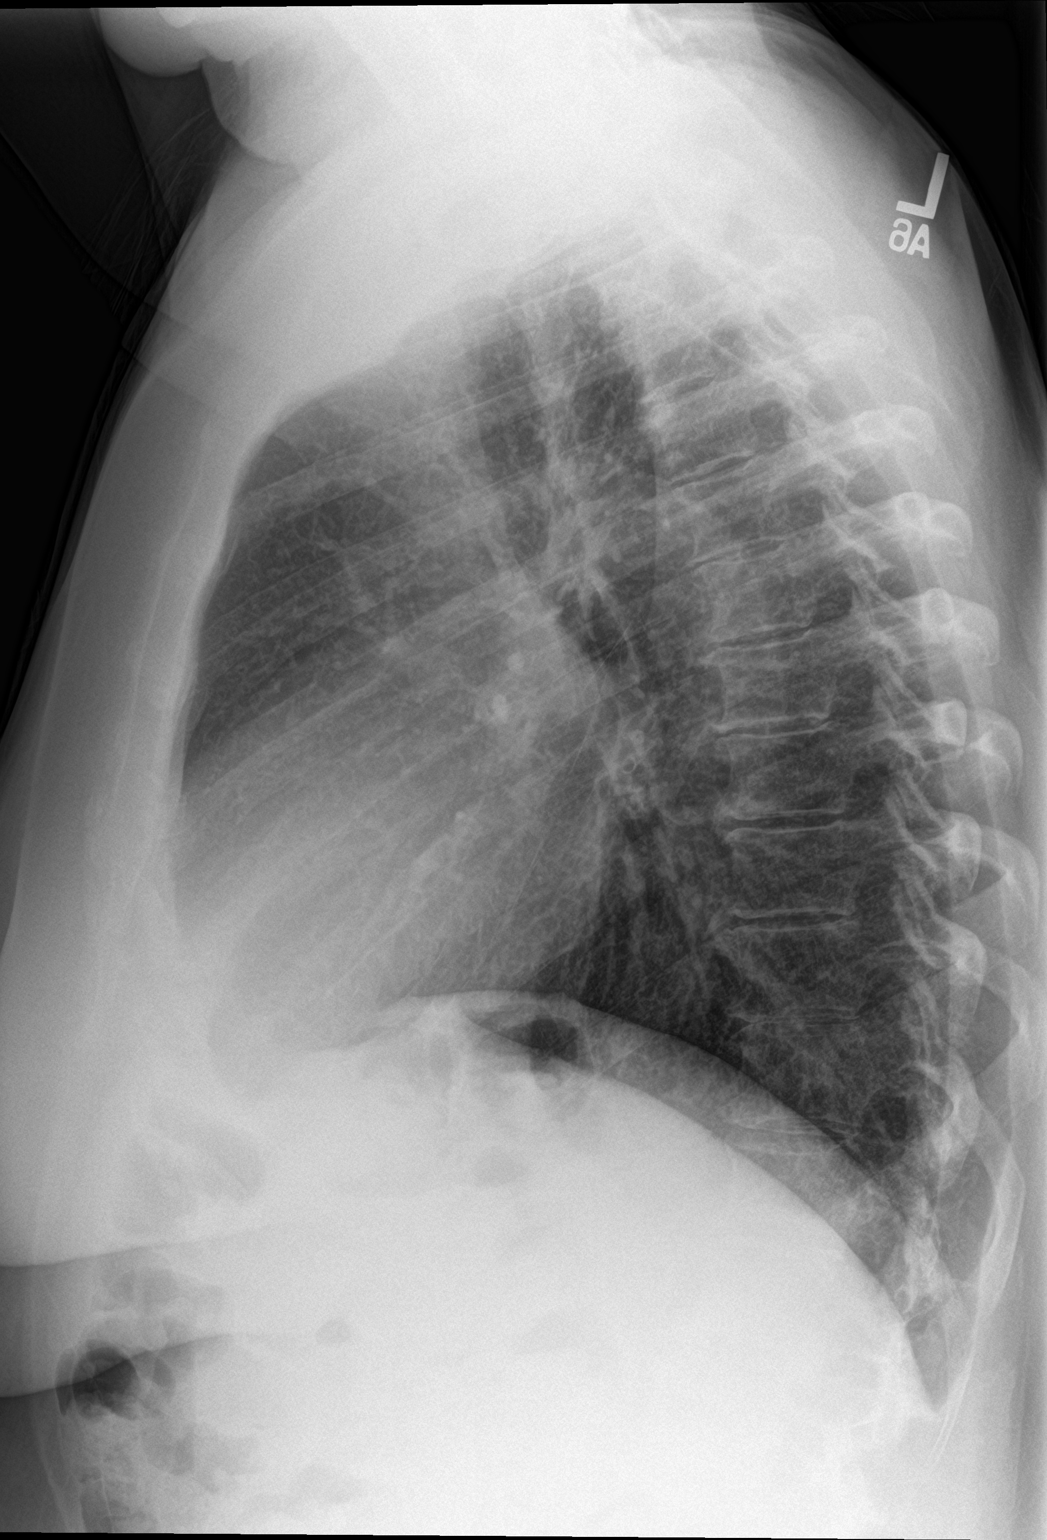

[2 of 2 positions shown; findings below may reference images not displayed]

FINDINGS: Mediastinum and hilar structures normal the lungs are clear. No
pleural effusion or pneumothorax. Heart size normal. Degenerative
change thoracic spine.
IMPRESSION: No acute cardiopulmonary disease.

## 2019-09-03 ENCOUNTER — Ambulatory Visit (HOSPITAL_COMMUNITY): Payer: Medicare Other

## 2019-09-04 DIAGNOSIS — M1711 Unilateral primary osteoarthritis, right knee: Secondary | ICD-10-CM | POA: Diagnosis not present

## 2019-09-04 DIAGNOSIS — M170 Bilateral primary osteoarthritis of knee: Secondary | ICD-10-CM | POA: Diagnosis not present

## 2019-09-04 DIAGNOSIS — M1712 Unilateral primary osteoarthritis, left knee: Secondary | ICD-10-CM | POA: Diagnosis not present

## 2019-09-08 ENCOUNTER — Other Ambulatory Visit: Payer: Self-pay | Admitting: Family Medicine

## 2019-09-17 ENCOUNTER — Telehealth: Payer: Self-pay | Admitting: Family Medicine

## 2019-09-17 ENCOUNTER — Other Ambulatory Visit: Payer: Self-pay

## 2019-09-17 DIAGNOSIS — Z13220 Encounter for screening for lipoid disorders: Secondary | ICD-10-CM

## 2019-09-17 DIAGNOSIS — Z79899 Other long term (current) drug therapy: Secondary | ICD-10-CM

## 2019-09-17 DIAGNOSIS — E039 Hypothyroidism, unspecified: Secondary | ICD-10-CM

## 2019-09-17 NOTE — Telephone Encounter (Signed)
appt is Monday. Pt states she will do labs on Monday before her appt. Last labs lipid, liver, bmp, tsh, cbc on 09/06/18

## 2019-09-17 NOTE — Telephone Encounter (Signed)
Patient needing labs for physical on 11/30

## 2019-09-18 NOTE — Telephone Encounter (Signed)
Rep same 

## 2019-09-19 ENCOUNTER — Encounter: Payer: Self-pay | Admitting: Family Medicine

## 2019-09-19 ENCOUNTER — Ambulatory Visit (INDEPENDENT_AMBULATORY_CARE_PROVIDER_SITE_OTHER): Payer: Medicare Other | Admitting: Family Medicine

## 2019-09-19 ENCOUNTER — Other Ambulatory Visit: Payer: Self-pay

## 2019-09-19 DIAGNOSIS — J329 Chronic sinusitis, unspecified: Secondary | ICD-10-CM | POA: Diagnosis not present

## 2019-09-19 DIAGNOSIS — J310 Chronic rhinitis: Secondary | ICD-10-CM | POA: Diagnosis not present

## 2019-09-19 MED ORDER — AMOXICILLIN-POT CLAVULANATE 875-125 MG PO TABS: 1.0000 | ORAL_TABLET | Freq: Two times a day (BID) | ORAL | 0 refills | Status: AC

## 2019-09-19 NOTE — Progress Notes (Signed)
   Subjective:  Audio plus video  Patient ID: Ashlee Solomon, female    DOB: November 09, 1950, 68 y.o.   MRN: [id number]  Sinus Problem This is a new problem. The current episode started 1 to 4 weeks ago (worse this week). Associated symptoms include congestion, coughing, headaches and sinus pressure.      Review of Systems  HENT: Positive for congestion and sinus pressure.   Respiratory: Positive for cough.   Neurological: Positive for headaches.   Virtual Visit via Video Note  I connected with Ashlee Solomon on 09/19/19 at 11:00 AM EST by a video enabled telemedicine application and verified that I am speaking with the correct person using two identifiers.  Location: Patient: home Provider: office   I discussed the limitations of evaluation and management by telemedicine and the availability of in person appointments. The patient expressed understanding and agreed to proceed.  History of Present Illness:    Observations/Objective:   Assessment and Plan:   Follow Up Instructions:    I discussed the assessment and treatment plan with the patient. The patient was provided an opportunity to ask questions and all were answered. The patient agreed with the plan and demonstrated an understanding of the instructions.   The patient was advised to call back or seek an in-person evaluation if the symptoms worsen or if the condition fails to improve as anticipated.  I provided 18 minutes of non-face-to-face time during this encounter. Started sev weeks ago  Cong and stuffy  Progressive this wk  Sinus headache   A little into the chest   Of concern she has a daughter-in-law with a Covid test pending.  She was exposed to that daughter-in-law.  Patient is rather adamant that she does not have COVID-19 and for that matter feels that her daughter-in-law does not have it either.     Objective:   Physical Exam  Virtual      Assessment & Plan:  Impression progressive symptoms  turning into apparent rhinosinusitis.  Patient reluctant to getting Covid testing at this time.  Patient agreed her for daughter-in-law dose positive with her COVID-19 test, since she was rounding over the last week, she will certainly get tested herself.  Antibiotics prescribed warning signs discussed.

## 2019-09-19 NOTE — Telephone Encounter (Signed)
Lab orders place and pt notified.

## 2019-09-22 ENCOUNTER — Ambulatory Visit: Payer: Medicare Other | Admitting: Family Medicine

## 2019-09-26 ENCOUNTER — Other Ambulatory Visit: Payer: Self-pay

## 2019-09-26 ENCOUNTER — Ambulatory Visit (HOSPITAL_COMMUNITY)
Admission: RE | Admit: 2019-09-26 | Discharge: 2019-09-26 | Disposition: A | Discharge status: Home / Self Care | Payer: Medicare Other | Source: Ambulatory Visit | Attending: Family Medicine | Admitting: Family Medicine

## 2019-09-26 DIAGNOSIS — Z1231 Encounter for screening mammogram for malignant neoplasm of breast: Secondary | ICD-10-CM | POA: Diagnosis not present

## 2019-09-26 DIAGNOSIS — E039 Hypothyroidism, unspecified: Secondary | ICD-10-CM | POA: Diagnosis not present

## 2019-09-26 DIAGNOSIS — Z13220 Encounter for screening for lipoid disorders: Secondary | ICD-10-CM | POA: Diagnosis not present

## 2019-09-26 DIAGNOSIS — Z79899 Other long term (current) drug therapy: Secondary | ICD-10-CM | POA: Diagnosis not present

## 2019-09-27 LAB — CBC WITH DIFFERENTIAL/PLATELET
Basophils Absolute: 0.1 10*3/uL (ref 0.0–0.2)
Basos: 1 %
EOS (ABSOLUTE): 0.3 10*3/uL (ref 0.0–0.4)
Eos: 4 %
Hematocrit: 41.1 % (ref 34.0–46.6)
Hemoglobin: 13.9 g/dL (ref 11.1–15.9)
Immature Grans (Abs): 0 10*3/uL (ref 0.0–0.1)
Immature Granulocytes: 0 %
Lymphocytes Absolute: 3.5 10*3/uL — ABNORMAL HIGH (ref 0.7–3.1)
Lymphs: 42 %
MCH: 30.3 pg (ref 26.6–33.0)
MCHC: 33.8 g/dL (ref 31.5–35.7)
MCV: 90 fL (ref 79–97)
Monocytes Absolute: 0.8 10*3/uL (ref 0.1–0.9)
Monocytes: 10 %
Neutrophils Absolute: 3.5 10*3/uL (ref 1.4–7.0)
Neutrophils: 43 %
Platelets: 231 10*3/uL (ref 150–450)
RBC: 4.59 x10E6/uL (ref 3.77–5.28)
RDW: 12.8 % (ref 11.7–15.4)
WBC: 8.2 10*3/uL (ref 3.4–10.8)

## 2019-09-27 LAB — BASIC METABOLIC PANEL
BUN/Creatinine Ratio: 26 (ref 12–28)
BUN: 29 mg/dL — ABNORMAL HIGH (ref 8–27)
CO2: 26 mmol/L (ref 20–29)
Calcium: 9.8 mg/dL (ref 8.7–10.3)
Chloride: 102 mmol/L (ref 96–106)
Creatinine, Ser: 1.1 mg/dL — ABNORMAL HIGH (ref 0.57–1.00)
GFR calc Af Amer: 59 mL/min/{1.73_m2} — ABNORMAL LOW (ref >59)
GFR calc non Af Amer: 51 mL/min/{1.73_m2} — ABNORMAL LOW (ref >59)
Glucose: 86 mg/dL (ref 65–99)
Potassium: 4.9 mmol/L (ref 3.5–5.2)
Sodium: 144 mmol/L (ref 134–144)

## 2019-09-27 LAB — LIPID PANEL
Chol/HDL Ratio: 1.7 ratio (ref 0.0–4.4)
Cholesterol, Total: 150 mg/dL (ref 100–199)
HDL: 87 mg/dL (ref >39)
LDL Chol Calc (NIH): 52 mg/dL (ref 0–99)
Triglycerides: 48 mg/dL (ref 0–149)
VLDL Cholesterol Cal: 11 mg/dL (ref 5–40)

## 2019-09-27 LAB — HEPATIC FUNCTION PANEL
ALT: 21 IU/L (ref 0–32)
AST: 34 IU/L (ref 0–40)
Albumin: 4.5 g/dL (ref 3.8–4.8)
Alkaline Phosphatase: 95 IU/L (ref 39–117)
Bilirubin Total: 0.5 mg/dL (ref 0.0–1.2)
Bilirubin, Direct: 0.2 mg/dL (ref 0.00–0.40)
Total Protein: 6.6 g/dL (ref 6.0–8.5)

## 2019-09-27 LAB — TSH: TSH: 4.19 u[IU]/mL (ref 0.450–4.500)

## 2019-10-07 DIAGNOSIS — M1711 Unilateral primary osteoarthritis, right knee: Secondary | ICD-10-CM | POA: Diagnosis not present

## 2019-10-07 DIAGNOSIS — M25561 Pain in right knee: Secondary | ICD-10-CM | POA: Diagnosis not present

## 2019-10-10 ENCOUNTER — Other Ambulatory Visit: Payer: Self-pay

## 2019-10-10 ENCOUNTER — Ambulatory Visit (INDEPENDENT_AMBULATORY_CARE_PROVIDER_SITE_OTHER): Payer: Medicare Other | Admitting: Family Medicine

## 2019-10-10 ENCOUNTER — Encounter: Payer: Self-pay | Admitting: Family Medicine

## 2019-10-10 VITALS — BP 118/74 | Temp 98.2°F | Ht 64.0 in | Wt 217.0 lb

## 2019-10-10 DIAGNOSIS — E039 Hypothyroidism, unspecified: Secondary | ICD-10-CM | POA: Diagnosis not present

## 2019-10-10 DIAGNOSIS — Z0000 Encounter for general adult medical examination without abnormal findings: Secondary | ICD-10-CM

## 2019-10-10 MED ORDER — METOPROLOL SUCCINATE ER 50 MG PO TB24: ORAL_TABLET | ORAL | 1 refills | Status: DC

## 2019-10-10 MED ORDER — FUROSEMIDE 20 MG PO TABS: 20.0000 mg | ORAL_TABLET | Freq: Every day | ORAL | 1 refills | Status: DC

## 2019-10-10 MED ORDER — LEVOTHYROXINE SODIUM 175 MCG PO TABS: ORAL_TABLET | ORAL | 1 refills | Status: DC

## 2019-10-10 MED ORDER — POTASSIUM CHLORIDE CRYS ER 10 MEQ PO TBCR: 10.0000 meq | EXTENDED_RELEASE_TABLET | Freq: Every day | ORAL | 1 refills | Status: DC

## 2019-10-10 NOTE — Progress Notes (Signed)
Subjective:    Patient ID: Ashlee Solomon, female    DOB: 11-Sep-1951, 68 y.o.   MRN: [id number]  HPI AWV- Annual Wellness Visit  The patient was seen for their annual wellness visit. The patient's past medical history, surgical history, and family history were reviewed. Pertinent vaccines were reviewed ( tetanus, pneumonia, shingles, flu) The patient's medication list was reviewed and updated.  The height and weight were entered.  BMI recorded in electronic record elsewhere  Cognitive screening was completed. Outcome of Mini - Cog: pass   Falls /depression screening electronically recorded within record elsewhere  Current tobacco usage: none (All patients who use tobacco were given written and verbal information on quitting)  Recent listing of emergency department/hospitalizations over the past year were reviewed.  current specialist the patient sees on a regular basis: dr Binnie Rail for knee and dr Raliegh Ip - cardiology   Medicare annual wellness visit patient questionnaire was reviewed.  A written screening schedule for the patient for the next 5-10 years was given. Appropriate discussion of followup regarding next visit was discussed.  Results for orders placed or performed in visit on 09/17/19  Lipid Profile  Result Value Ref Range   Cholesterol, Total 150 100 - 199 mg/dL   Triglycerides 48 0 - 149 mg/dL   HDL 87 >39 mg/dL   VLDL Cholesterol Cal 11 5 - 40 mg/dL   LDL Chol Calc (NIH) 52 0 - 99 mg/dL   Chol/HDL Ratio 1.7 0.0 - 4.4 ratio  Hepatic function panel  Result Value Ref Range   Total Protein 6.6 6.0 - 8.5 g/dL   Albumin 4.5 3.8 - 4.8 g/dL   Bilirubin Total 0.5 0.0 - 1.2 mg/dL   Bilirubin, Direct 0.20 0.00 - 0.40 mg/dL   Alkaline Phosphatase 95 39 - 117 IU/L   AST 34 0 - 40 IU/L   ALT 21 0 - 32 IU/L  Basic metabolic panel  Result Value Ref Range   Glucose 86 65 - 99 mg/dL   BUN 29 (H) 8 - 27 mg/dL   Creatinine, Ser 1.10 (H) 0.57 - 1.00 mg/dL   GFR calc non Af  Amer 51 (L) >59 mL/min/1.73   GFR calc Af Amer 59 (L) >59 mL/min/1.73   BUN/Creatinine Ratio 26 12 - 28   Sodium 144 134 - 144 mmol/L   Potassium 4.9 3.5 - 5.2 mmol/L   Chloride 102 96 - 106 mmol/L   CO2 26 20 - 29 mmol/L   Calcium 9.8 8.7 - 10.3 mg/dL  TSH  Result Value Ref Range   TSH 4.190 0.450 - 4.500 uIU/mL  CBC with Differential  Result Value Ref Range   WBC 8.2 3.4 - 10.8 x10E3/uL   RBC 4.59 3.77 - 5.28 x10E6/uL   Hemoglobin 13.9 11.1 - 15.9 g/dL   Hematocrit 41.1 34.0 - 46.6 %   MCV 90 79 - 97 fL   MCH 30.3 26.6 - 33.0 pg   MCHC 33.8 31.5 - 35.7 g/dL   RDW 12.8 11.7 - 15.4 %   Platelets 231 150 - 450 x10E3/uL   Neutrophils 43 Not Estab. %   Lymphs 42 Not Estab. %   Monocytes 10 Not Estab. %   Eos 4 Not Estab. %   Basos 1 Not Estab. %   Neutrophils Absolute 3.5 1.4 - 7.0 x10E3/uL   Lymphocytes Absolute 3.5 (H) 0.7 - 3.1 x10E3/uL   Monocytes Absolute 0.8 0.1 - 0.9 x10E3/uL   EOS (ABSOLUTE) 0.3 0.0 - 0.4 x10E3/uL  Basophils Absolute 0.1 0.0 - 0.2 x10E3/uL   Immature Granulocytes 0 Not Estab. %   Immature Grans (Abs) 0.0 0.0 - 0.1 x10E3/uL   Blood pressure medicine and blood pressure levels reviewed today with patient. Compliant with blood pressure medicine. States does not miss a dose. No obvious side effects. Blood pressure generally good when checked elsewhere. Watching salt intake.  Thyroid.  Patient notes compliance with her thyroid.  Does not miss a dose.  Some fatigue lately and trouble with gaining weight.  Walking reg  Five to six times per week   Takes mobic uses it fairly regularly.    Review of Systems  Constitutional: Negative for activity change, appetite change and fatigue.  HENT: Negative for congestion and rhinorrhea.   Eyes: Negative for discharge.  Respiratory: Negative for cough, chest tightness and wheezing.   Cardiovascular: Negative for chest pain.  Gastrointestinal: Negative for abdominal pain, blood in stool and vomiting.  Endocrine:  Negative for polyphagia.  Genitourinary: Negative for difficulty urinating and frequency.  Musculoskeletal: Negative for neck pain.  Skin: Negative for color change.  Allergic/Immunologic: Negative for environmental allergies and food allergies.  Neurological: Negative for weakness and headaches.  Psychiatric/Behavioral: Negative for agitation and behavioral problems.  All other systems reviewed and are negative.      Objective:   Physical Exam Vitals reviewed.  Constitutional:      Appearance: She is well-developed.  HENT:     Head: Normocephalic.     Right Ear: External ear normal.     Left Ear: External ear normal.  Eyes:     Pupils: Pupils are equal, round, and reactive to light.  Neck:     Thyroid: No thyromegaly.  Cardiovascular:     Rate and Rhythm: Normal rate and regular rhythm.     Heart sounds: Normal heart sounds. No murmur.  Pulmonary:     Effort: Pulmonary effort is normal. No respiratory distress.     Breath sounds: Normal breath sounds. No wheezing.  Abdominal:     General: Bowel sounds are normal. There is no distension.     Palpations: Abdomen is soft. There is no mass.     Tenderness: There is no abdominal tenderness.  Musculoskeletal:        General: No tenderness. Normal range of motion.     Cervical back: Normal range of motion.  Lymphadenopathy:     Cervical: No cervical adenopathy.  Skin:    General: Skin is warm and dry.  Neurological:     Mental Status: She is alert and oriented to person, place, and time.     Motor: No abnormal muscle tone.  Psychiatric:        Behavior: Behavior normal.    Pelvic exam and clinical breast exam performed.  No Pap obtained due to age and no prior history of Pap abnormalities.       Assessment & Plan:  Impression 1 wellness exam.  Diet discussed.  Exercise discussed.  Vaccines discussed patient has already had flu shot.  Up-to-date on mammogram.  Up-to-date on colonoscopy  2.  Hypothyroidism.  TSH  borderline high.  Will increase dose rationale discussed  4.  Renal insufficiency.  Somewhat worsened.  Concerning.  I advised patient stop Mobic and use Tylenol instead.  In addition to that she is on a blood thinner due to atrial fibrillation and low back was not a good idea anyway.  Call discussed.  Patient will try Tylenol  Recommend repeat TSH in med 7  in 3 months follow-up visit in 6 months

## 2019-10-14 ENCOUNTER — Other Ambulatory Visit: Payer: Self-pay | Admitting: Family Medicine

## 2019-10-14 DIAGNOSIS — M1711 Unilateral primary osteoarthritis, right knee: Secondary | ICD-10-CM | POA: Diagnosis not present

## 2019-10-14 NOTE — Telephone Encounter (Signed)
I had asked the pt to stay off meloxicam if possible so hold of, make sure I did not do the thyr refill with the wrong button click

## 2019-10-14 NOTE — Telephone Encounter (Signed)
Seen for wellness on 12/18. I will deny levothyroxine because dose was changed on 12/18 to 178mcg. Do you want to approve the meloxicam.

## 2019-10-21 DIAGNOSIS — M1711 Unilateral primary osteoarthritis, right knee: Secondary | ICD-10-CM | POA: Diagnosis not present

## 2019-11-04 ENCOUNTER — Encounter: Payer: Self-pay | Admitting: Family Medicine

## 2019-11-20 DIAGNOSIS — Z23 Encounter for immunization: Secondary | ICD-10-CM | POA: Diagnosis not present

## 2019-11-27 ENCOUNTER — Encounter: Payer: Self-pay | Admitting: Family Medicine

## 2019-12-05 ENCOUNTER — Other Ambulatory Visit: Payer: Self-pay

## 2019-12-05 ENCOUNTER — Encounter: Payer: Self-pay | Admitting: Cardiovascular Disease

## 2019-12-05 ENCOUNTER — Ambulatory Visit (INDEPENDENT_AMBULATORY_CARE_PROVIDER_SITE_OTHER): Payer: Medicare Other | Admitting: Cardiovascular Disease

## 2019-12-05 VITALS — BP 108/74 | HR 78 | Ht 64.0 in | Wt 223.0 lb

## 2019-12-05 DIAGNOSIS — R600 Localized edema: Secondary | ICD-10-CM

## 2019-12-05 DIAGNOSIS — Z7901 Long term (current) use of anticoagulants: Secondary | ICD-10-CM | POA: Diagnosis not present

## 2019-12-05 DIAGNOSIS — I4821 Permanent atrial fibrillation: Secondary | ICD-10-CM

## 2019-12-05 NOTE — Progress Notes (Signed)
SUBJECTIVE:  The patient presents for follow-up of atrial fibrillation.  Echocardiogram on 01/23/2018 demonstrated normal left ventricular systolic and diastolic function and normal regional wall motion, LVEF 55 to 60%. There was mild mitral and moderate tricuspid regurgitation. The right atrium was moderately dilated. The left atrium was normal in size.  The patient denies any symptoms of chest pain, palpitations, shortness of breath, lightheadedness, dizziness, orthopnea, PND, and syncope.  She takes Lasix for chronic bilateral leg edema.  She told me that her PCP, Dr. Mickie Hillier, is retiring in June after 28 years of practice.  Social history: She is married. She has 3 sons and several grandchildren. She is a retired Equities trader. She worked from [id number].  Review of Systems: As per "subjective", otherwise negative.  No Known Allergies  Current Outpatient Medications  Medication Sig Dispense Refill  . albuterol (VENTOLIN HFA) 108 (90 Base) MCG/ACT inhaler TAKE 2 PUFFS BY MOUTH EVERY 4 HOURS AS NEEDED FOR WHEEZE 18 g 2  . cetirizine (ZYRTEC) 10 MG tablet Take 10 mg by mouth daily.    . furosemide (LASIX) 20 MG tablet Take 1 tablet (20 mg total) by mouth daily. 90 tablet 1  . levothyroxine (SYNTHROID) 175 MCG tablet TAKE 1 TABLET BY MOUTH DAILY BEFORE BREAKFAST 90 tablet 1  . loratadine (CLARITIN) 10 MG tablet Take 10 mg by mouth daily with breakfast.     . magnesium oxide (MAG-OX) 400 MG tablet Take 400 mg by mouth at bedtime.     . metoprolol succinate (TOPROL-XL) 50 MG 24 hr tablet TAKE 1 TABLET BY MOUTH DAILY. TAKE WITH OR IMMEDIATELY FOLLOWING A MEAL. 90 tablet 1  . OVER THE COUNTER MEDICATION Vit C 500mg  qd Vit b one qd Vit d3 5000 qd Lutein 10mg  qd coq10 100 qd Butchers broom 2 caps qd    . potassium chloride (KLOR-CON M10) 10 MEQ tablet Take 1 tablet (10 mEq total) by mouth daily. 90 tablet 1  . Rivaroxaban (XARELTO) 15 MG TABS tablet Take 1 tablet (15 mg  total) by mouth daily with supper. 90 tablet 3   No current facility-administered medications for this visit.    Past Medical History:  Diagnosis Date  . Arthritis   . DDD (degenerative disc disease)   . GERD (gastroesophageal reflux disease)   . Hypertension    clearance Dr Wolfgang Phoenix with note on chart  . Hypothyroidism   . Pneumonia 1/13  . Reactive airway disease     Past Surgical History:  Procedure Laterality Date  . APPENDECTOMY    . CESAREAN SECTION     x 3  . COLONOSCOPY N/A 04/14/2015   Procedure: COLONOSCOPY;  Surgeon: Rogene Houston, MD;  Location: [facility];  Service: Endoscopy;  Laterality: N/A;  830 - moved to 11:30 - Ann to notify pt  . TONSILLECTOMY    . 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 Marital status: Married    Spouse name: Not on file  . Number of children: Not on file  . Years of education: Not on file  . Highest education level: Not on file  Occupational History  . Not on file  Tobacco Use  . Smoking status: Former Smoker    Packs/day: 0.25    Types: Cigarettes    Quit date: 05/25/2007  Years since quitting: 12.5  . Smokeless tobacco: Never Used  Substance and Sexual Activity  . Alcohol use: Yes    Comment: mixed drink nightly  . Drug use: No  . Sexual activity: Not on file  Other Topics Concern  . Not on file  Social History Narrative  . Not on file   Social Determinants of Health   Financial Resource Strain:   . Difficulty of Paying Living Expenses: Not on file  Food Insecurity:   . Worried About Charity fundraiser in the Last Year: Not on file  . Ran Out of Food in the Last Year: Not on file  Transportation Needs:   . Lack of Transportation (Medical): Not on file  . Lack of Transportation (Non-Medical): Not on file  Physical Activity:   . Days of  Exercise per Week: Not on file  . Minutes of Exercise per Session: Not on file  Stress:   . Feeling of Stress : Not on file  Social Connections:   . Frequency of Communication with Friends and Family: Not on file  . Frequency of Social Gatherings with Friends and Family: Not on file  . Attends Religious Services: Not on file  . Active Member of Clubs or Organizations: Not on file  . Attends Archivist Meetings: Not on file  . Marital Status: Not on file  Intimate Partner Violence:   . Fear of Current or Ex-Partner: Not on file  . Emotionally Abused: Not on file  . Physically Abused: Not on file  . Sexually Abused: Not on file    [facility] LPN was present throughout the entirety of the encounter.  Vitals:   12/05/19 1027  BP: 108/74  Pulse: 78  SpO2: 98%  Weight: 223 lb (101.2 kg)  Height: 5\' 4"  (1.626 m)    Wt Readings from Last 3 Encounters:  12/05/19 223 lb (101.2 kg)  10/10/19 217 lb (98.4 kg)  12/04/18 213 lb 9.6 oz (96.9 kg)     PHYSICAL EXAM General: NAD HEENT: Normal. Neck: No JVD, no thyromegaly. Lungs: Clear to auscultation bilaterally with normal respiratory effort. CV: Regular rate and irregular rhythm, normal S1/S2, no S3, no murmur.  Trace, nonpitting bilateral leg edema.  No carotid bruit.   Abdomen: Soft, nontender, no distention.  Neurologic: Alert and oriented.  Psych: Normal affect. Skin: Normal. Musculoskeletal: No gross deformities.      Labs: Lab Results  Component Value Date/Time   K 4.9 09/26/2019 10:30 AM   BUN 29 (H) 09/26/2019 10:30 AM   CREATININE 1.10 (H) 09/26/2019 10:30 AM   CREATININE 0.98 09/30/2014 08:03 AM   ALT 21 09/26/2019 10:30 AM   TSH 4.190 09/26/2019 10:30 AM   HGB 13.9 09/26/2019 10:30 AM     Lipids: Lab Results  Component Value Date/Time   LDLCALC 52 09/26/2019 10:30 AM   CHOL 150 09/26/2019 10:30 AM   TRIG 48 09/26/2019 10:30 AM   HDL 87 09/26/2019 10:30 AM       ASSESSMENT AND  PLAN:  1.  Permanent atrial fibrillation: Symptomatically stable on metoprolol succinate 50 mg daily. Systemically anticoagulated with renally dosed Xarelto 15 mg daily.No changes to therapy.  2.  Chronic bilateral leg edema: Takes Lasix 20 mg daily.   Disposition: Follow up 1 yr   Kate Sable, M.D., F.A.C.C.

## 2019-12-05 NOTE — Patient Instructions (Signed)

## 2019-12-22 ENCOUNTER — Other Ambulatory Visit: Payer: Self-pay | Admitting: Nurse Practitioner

## 2019-12-22 ENCOUNTER — Encounter: Payer: Self-pay | Admitting: Family Medicine

## 2019-12-22 DIAGNOSIS — E039 Hypothyroidism, unspecified: Secondary | ICD-10-CM

## 2019-12-26 DIAGNOSIS — E039 Hypothyroidism, unspecified: Secondary | ICD-10-CM | POA: Diagnosis not present

## 2019-12-27 LAB — TSH: TSH: 1.69 u[IU]/mL (ref 0.450–4.500)

## 2020-01-07 ENCOUNTER — Other Ambulatory Visit: Payer: Self-pay | Admitting: Cardiovascular Disease

## 2020-01-07 DIAGNOSIS — M1711 Unilateral primary osteoarthritis, right knee: Secondary | ICD-10-CM | POA: Diagnosis not present

## 2020-03-02 ENCOUNTER — Telehealth: Payer: Self-pay | Admitting: Family Medicine

## 2020-03-02 DIAGNOSIS — Z13220 Encounter for screening for lipoid disorders: Secondary | ICD-10-CM

## 2020-03-02 DIAGNOSIS — Z79899 Other long term (current) drug therapy: Secondary | ICD-10-CM

## 2020-03-02 DIAGNOSIS — E039 Hypothyroidism, unspecified: Secondary | ICD-10-CM

## 2020-03-02 DIAGNOSIS — Z0000 Encounter for general adult medical examination without abnormal findings: Secondary | ICD-10-CM

## 2020-03-02 NOTE — Telephone Encounter (Signed)
Lab orders placed. Left message to return call  

## 2020-03-02 NOTE — Telephone Encounter (Signed)
Yes as written, thx, dr. Darene Lamer

## 2020-03-02 NOTE — Telephone Encounter (Signed)
Last labs 12/2019: TSH                  09/2019: Lipid, Liver, Met 7, TSH, CBC

## 2020-03-02 NOTE — Telephone Encounter (Signed)
Pt would like to know if she needs lab work for her 6 month appt in June

## 2020-03-30 ENCOUNTER — Other Ambulatory Visit: Payer: Self-pay | Admitting: Family Medicine

## 2020-03-31 NOTE — Telephone Encounter (Signed)
Last seen 10/10/19 and has up coming appt on 6/28. Bw orders already put in

## 2020-04-01 DIAGNOSIS — M1711 Unilateral primary osteoarthritis, right knee: Secondary | ICD-10-CM | POA: Diagnosis not present

## 2020-04-02 ENCOUNTER — Telehealth: Payer: Self-pay | Admitting: Family Medicine

## 2020-04-02 NOTE — Telephone Encounter (Signed)
Pt is having knee surgery on 05/24/2020 and she is wanting to know if Wellness Appt for 06/27 can she make that her preope visit or does she need to reschedule for pre opp

## 2020-04-05 ENCOUNTER — Telehealth: Payer: Self-pay | Admitting: *Deleted

## 2020-04-05 NOTE — Telephone Encounter (Signed)
Error

## 2020-04-05 NOTE — Telephone Encounter (Signed)
Pt sent mychart message and she wants to change wellness to preop. Please change on schedule. No need to call pt. Mychart message was sent to pt

## 2020-04-06 NOTE — Telephone Encounter (Signed)
Resent

## 2020-04-06 NOTE — Telephone Encounter (Signed)
This has been taken care of.

## 2020-04-08 DIAGNOSIS — Z79899 Other long term (current) drug therapy: Secondary | ICD-10-CM | POA: Diagnosis not present

## 2020-04-08 DIAGNOSIS — E039 Hypothyroidism, unspecified: Secondary | ICD-10-CM | POA: Diagnosis not present

## 2020-04-08 DIAGNOSIS — Z0000 Encounter for general adult medical examination without abnormal findings: Secondary | ICD-10-CM | POA: Diagnosis not present

## 2020-04-08 DIAGNOSIS — Z13220 Encounter for screening for lipoid disorders: Secondary | ICD-10-CM | POA: Diagnosis not present

## 2020-04-09 LAB — CBC WITH DIFFERENTIAL/PLATELET
Basophils Absolute: 0.1 10*3/uL (ref 0.0–0.2)
Basos: 1 %
EOS (ABSOLUTE): 0.2 10*3/uL (ref 0.0–0.4)
Eos: 3 %
Hematocrit: 40 % (ref 34.0–46.6)
Hemoglobin: 13.6 g/dL (ref 11.1–15.9)
Immature Grans (Abs): 0 10*3/uL (ref 0.0–0.1)
Immature Granulocytes: 0 %
Lymphocytes Absolute: 3.7 10*3/uL — ABNORMAL HIGH (ref 0.7–3.1)
Lymphs: 45 %
MCH: 30.6 pg (ref 26.6–33.0)
MCHC: 34 g/dL (ref 31.5–35.7)
MCV: 90 fL (ref 79–97)
Monocytes Absolute: 0.7 10*3/uL (ref 0.1–0.9)
Monocytes: 8 %
Neutrophils Absolute: 3.5 10*3/uL (ref 1.4–7.0)
Neutrophils: 43 %
Platelets: 269 10*3/uL (ref 150–450)
RBC: 4.44 x10E6/uL (ref 3.77–5.28)
RDW: 13 % (ref 11.7–15.4)
WBC: 8.1 10*3/uL (ref 3.4–10.8)

## 2020-04-09 LAB — HEPATIC FUNCTION PANEL
ALT: 10 IU/L (ref 0–32)
AST: 27 IU/L (ref 0–40)
Albumin: 4.4 g/dL (ref 3.8–4.8)
Alkaline Phosphatase: 86 IU/L (ref 48–121)
Bilirubin Total: 0.6 mg/dL (ref 0.0–1.2)
Bilirubin, Direct: 0.23 mg/dL (ref 0.00–0.40)
Total Protein: 6.7 g/dL (ref 6.0–8.5)

## 2020-04-09 LAB — BASIC METABOLIC PANEL
BUN/Creatinine Ratio: 26 (ref 12–28)
BUN: 23 mg/dL (ref 8–27)
CO2: 26 mmol/L (ref 20–29)
Calcium: 10 mg/dL (ref 8.7–10.3)
Chloride: 101 mmol/L (ref 96–106)
Creatinine, Ser: 0.9 mg/dL (ref 0.57–1.00)
GFR calc Af Amer: 75 mL/min/{1.73_m2} (ref >59)
GFR calc non Af Amer: 65 mL/min/{1.73_m2} (ref >59)
Glucose: 92 mg/dL (ref 65–99)
Potassium: 4.5 mmol/L (ref 3.5–5.2)
Sodium: 141 mmol/L (ref 134–144)

## 2020-04-09 LAB — LIPID PANEL
Chol/HDL Ratio: 1.5 ratio (ref 0.0–4.4)
Cholesterol, Total: 149 mg/dL (ref 100–199)
HDL: 97 mg/dL (ref >39)
LDL Chol Calc (NIH): 41 mg/dL (ref 0–99)
Triglycerides: 51 mg/dL (ref 0–149)
VLDL Cholesterol Cal: 11 mg/dL (ref 5–40)

## 2020-04-09 LAB — TSH: TSH: 0.588 u[IU]/mL (ref 0.450–4.500)

## 2020-04-19 ENCOUNTER — Ambulatory Visit (INDEPENDENT_AMBULATORY_CARE_PROVIDER_SITE_OTHER): Payer: Medicare Other | Admitting: Family Medicine

## 2020-04-19 ENCOUNTER — Encounter: Payer: Self-pay | Admitting: Family Medicine

## 2020-04-19 ENCOUNTER — Other Ambulatory Visit: Payer: Self-pay

## 2020-04-19 VITALS — BP 110/70 | HR 98 | Temp 94.1°F | Ht 64.0 in | Wt 216.8 lb

## 2020-04-19 DIAGNOSIS — M1711 Unilateral primary osteoarthritis, right knee: Secondary | ICD-10-CM | POA: Diagnosis not present

## 2020-04-19 DIAGNOSIS — Z01818 Encounter for other preprocedural examination: Secondary | ICD-10-CM

## 2020-04-19 DIAGNOSIS — I4891 Unspecified atrial fibrillation: Secondary | ICD-10-CM | POA: Diagnosis not present

## 2020-04-19 DIAGNOSIS — Z7901 Long term (current) use of anticoagulants: Secondary | ICD-10-CM

## 2020-04-19 NOTE — Progress Notes (Addendum)
Patient ID: Ashlee Solomon, female    DOB: 1951/03/25, 69 y.o.   MRN: [id number]   Chief Complaint  Patient presents with  . Pre-op Exam    Pt here today for Pre Op visit. Pt is having Right Knee Replacement on 05/24/20.    Subjective:    HPI Seen for pre-op visit.  Pt having rt knee TKR with Dr. Binnie Rail. scheduled -05/24/20.  Labs reviewed- normal. Doing weight watchers, and lost 80 lbs with that program.  Pt has h/o Afib- taking xarelto.   No previous problems with anesthesia. Rt knee pain, with arthritis and has bone spurs.  Was on meloxicam, but had to stop due to blood thinner and had some elevated kidney function.  Doing injections in rt knee for a while and not helping as much and now it's time for her to get replacement.   Medical History Ashlee Solomon has a past medical history of Arthritis, DDD (degenerative disc disease), GERD (gastroesophageal reflux disease), Hypertension, Hypothyroidism, Pneumonia (1/13), and Reactive airway disease.   Outpatient Encounter Medications as of 04/19/2020  Medication Sig  . albuterol (VENTOLIN HFA) 108 (90 Base) MCG/ACT inhaler TAKE 2 PUFFS BY MOUTH EVERY 4 HOURS AS NEEDED FOR WHEEZE  . cetirizine (ZYRTEC) 10 MG tablet Take 10 mg by mouth daily.  . furosemide (LASIX) 20 MG tablet Take 1 tablet (20 mg total) by mouth daily.  Marland Kitchen levothyroxine (SYNTHROID) 175 MCG tablet TAKE 1 TABLET BY MOUTH EVERY DAY BEFORE BREAKFAST  . loratadine (CLARITIN) 10 MG tablet Take 10 mg by mouth daily with breakfast.   . magnesium oxide (MAG-OX) 400 MG tablet Take 400 mg by mouth at bedtime.   . metoprolol succinate (TOPROL-XL) 50 MG 24 hr tablet TAKE 1 TABLET BY MOUTH DAILY. TAKE WITH OR IMMEDIATELY FOLLOWING A MEAL.  Marland Kitchen OVER THE COUNTER MEDICATION Vit C 500mg  qd Vit b one qd Vit d3 5000 qd Lutein 10mg  qd coq10 100 qd Butchers broom 2 caps qd  . potassium chloride (KLOR-CON M10) 10 MEQ tablet Take 1 tablet (10 mEq total) by mouth daily.  Alveda Reasons 15 MG TABS  tablet TAKE 1 TABLET (15 MG TOTAL) BY MOUTH DAILY WITH SUPPER.   No facility-administered encounter medications on file as of 04/19/2020.     Review of Systems  Constitutional: Negative for chills and fever.  HENT: Negative for congestion, rhinorrhea and sore throat.   Respiratory: Negative for cough, shortness of breath and wheezing.   Cardiovascular: Negative for chest pain and leg swelling.  Gastrointestinal: Negative for abdominal pain, diarrhea, nausea and vomiting.  Genitourinary: Negative for dysuria and frequency.  Musculoskeletal: Positive for arthralgias (chronic rt knee pain). Negative for back pain.  Skin: Negative for rash.  Neurological: Negative for dizziness, weakness and headaches.     Vitals BP 110/70   Pulse 98   Temp (!) 94.1 F (34.5 C)   Ht 5\' 4"  (1.626 m)   Wt 216 lb 12.8 oz (98.3 kg)   SpO2 98%   BMI 37.21 kg/m   Objective:   Physical Exam Vitals and nursing note reviewed.  Constitutional:      General: She is not in acute distress.    Appearance: Normal appearance. She is not ill-appearing.  HENT:     Head: Normocephalic and atraumatic.     Nose: Nose normal.     Mouth/Throat:     Mouth: Mucous membranes are moist.     Pharynx: Oropharynx is clear.  Eyes:     Extraocular  Movements: Extraocular movements intact.     Conjunctiva/sclera: Conjunctivae normal.     Pupils: Pupils are equal, round, and reactive to light.  Cardiovascular:     Rate and Rhythm: Normal rate. Rhythm irregular.     Pulses: Normal pulses.     Heart sounds: Normal heart sounds.  Pulmonary:     Effort: Pulmonary effort is normal. No respiratory distress.     Breath sounds: Normal breath sounds. No wheezing, rhonchi or rales.  Musculoskeletal:        General: Normal range of motion.     Right lower leg: No edema.     Left lower leg: No edema.     Comments: +ttp medial right knee, no erythema, warmth, or rash  Skin:    General: Skin is warm and dry.     Findings: No  lesion or rash.  Neurological:     General: No focal deficit present.     Mental Status: She is alert and oriented to person, place, and time.     Cranial Nerves: No cranial nerve deficit.     Motor: No weakness.     Gait: Gait normal.  Psychiatric:        Mood and Affect: Mood normal.        Behavior: Behavior normal.      Assessment and Plan   1. Pre-op examination - EKG 12-Lead - INR/PT - Urinalysis  2. On anticoagulant therapy - INR/PT  3. Primary osteoarthritis of right knee  4. Atrial fibrillation, unspecified type [facility])    Risk evaluation is low risk for this surgery. Labs reviewed, cbc and cmp- normal.  Ordered UA and pt/inr. EKG- reviewed. Afib, 74 bpm.  F/u with ortho and cardiology about discontinuing the xarelto and bridging medication. Cont to take metoprolol morning of surgery with 1 sip water.  Advising pt that I will send not to her cardiologist to see about the bridging of anticoagulation.  Pt in agreement.    F/u prn.  Addendum- Cardiology wrote a note and consulted on 04/30/20, pt may stop the xarelto 3 days prior to procedure and restart pending recommendations from orthopedics.  F/u prn.

## 2020-04-19 NOTE — Patient Instructions (Signed)
recommending taking metoprolol morning of surgery with 1 sip water.

## 2020-04-20 ENCOUNTER — Encounter: Payer: Self-pay | Admitting: Family Medicine

## 2020-04-20 LAB — URINALYSIS
Bilirubin, UA: NEGATIVE
Glucose, UA: NEGATIVE
Ketones, UA: NEGATIVE
Leukocytes,UA: NEGATIVE
Nitrite, UA: NEGATIVE
Protein,UA: NEGATIVE
RBC, UA: NEGATIVE
Specific Gravity, UA: 1.012 (ref 1.005–1.030)
Urobilinogen, Ur: 0.2 mg/dL (ref 0.2–1.0)
pH, UA: 6.5 (ref 5.0–7.5)

## 2020-04-20 LAB — PROTIME-INR
INR: 1 (ref 0.9–1.2)
Prothrombin Time: 10.9 s (ref 9.1–12.0)

## 2020-04-22 DIAGNOSIS — M1711 Unilateral primary osteoarthritis, right knee: Secondary | ICD-10-CM | POA: Diagnosis not present

## 2020-04-30 ENCOUNTER — Telehealth: Payer: Self-pay | Admitting: Cardiovascular Disease

## 2020-04-30 NOTE — Telephone Encounter (Signed)
New message       [facility] Pre-operative Risk Assessment    HEARTCARE STAFF: - Please ensure there is not already an duplicate clearance open for this procedure. - Under Visit Info/Reason for Call, type in Other and utilize the format Clearance MM/DD/YY or Clearance TBD. Do not use dashes or single digits. - If request is for dental extraction, please clarify the # of teeth to be extracted.  Request for surgical clearance:  1. What type of surgery is being performed? Rt total knee   2. When is this surgery scheduled? 05/25/20  3. What type of clearance is required (medical clearance vs. Pharmacy clearance to hold med vs. Both)? Xarelto  -need instructions   4. Are there any medications that need to be held prior to surgery and how long? Xarelto?  5. Practice name and name of physician performing surgery? Dr Graceann Congress  6. What is the office phone number? [phone]   7.   What is the office fax number?  [phone]  8.   Anesthesia type (None, local, MAC, general) ? Choice    Jannet Askew 04/30/2020, 2:39 PM  _________________________________________________________________   (provider comments below)

## 2020-04-30 NOTE — Telephone Encounter (Signed)
Clinical pharmacist to review Xarelto 

## 2020-05-03 NOTE — Telephone Encounter (Signed)
Patient with diagnosis of atrial fibrillation on Xarelto for anticoagulation.    Procedure: R total knee Date of procedure: 05/25/20  CHADS2-VASc score of  2 (AGE, female)  CrCl 90.3 Platelet count 269  Per office protocol, patient can hold Xarelto for 3 days prior to procedure.    For orthopedic procedures please be sure to resume therapeutic (not prophylactic) dosing.

## 2020-05-03 NOTE — Telephone Encounter (Signed)
   Primary Cardiologist: Kate Sable, MD  Chart reviewed as part of pre-operative protocol coverage.   Per pharmacy recommendations, patient can hold xarelto 3 days prior to her planned procedure with plans to restart as soon as she is cleared to do so by her orthopedist.   I will route this recommendation to the requesting party via [facility] fax function and remove from pre-op pool.  Please call with questions.  Abigail Butts, PA-C 05/03/2020, 8:29 AM

## 2020-05-12 ENCOUNTER — Encounter: Payer: Self-pay | Admitting: Family Medicine

## 2020-05-19 ENCOUNTER — Encounter (HOSPITAL_COMMUNITY): Payer: Self-pay

## 2020-05-19 NOTE — Patient Instructions (Addendum)
DUE TO COVID-19 ONLY ONE VISITOR IS ALLOWED TO COME WITH YOU AND STAY IN THE WAITING ROOM ONLY DURING PRE OP AND PROCEDURE DAY OF SURGERY. TWO VISITOR MAY VISIT WITH YOU AFTER SURGERY IN YOUR PRIVATE ROOM DURING VISITING HOURS ONLY!  10a-8p  YOU NEED TO HAVE A COVID 19 TEST ON_7-29-21______ @_1030______ , THIS TEST MUST BE DONE BEFORE SURGERY, COME  [address], Ashlee Solomon , [id number].  [facility]) ,                Ashlee Solomon  05/19/2020   Your procedure is scheduled on: 05-24-20   Report to [facility] Main  Entrance   Report to admitting at      0800  AM     Call this number if you have problems the morning of surgery [phone]    Remember: NO SOLID FOOD AFTER MIDNIGHT THE NIGHT PRIOR TO SURGERY. NOTHING BY MOUTH EXCEPT CLEAR LIQUIDS UNTIL . PLEASE FINISH ENSURE DRINK PER SURGEON ORDER  WHICH NEEDS TO BE COMPLETED AT         0729 am then nothing by mouth .    CLEAR LIQUID DIET   Foods Allowed                                                                                Foods Excluded  Coffee and tea, regular and decaf  No creamer                           liquids that you cannot  Plain Jell-O any favor except red or purple                                           see through such as: Fruit ices (not with fruit pulp)                                                                    milk, soups, orange juice  Iced Popsicles                                                           All solid food Carbonated beverages, regular and diet                                    Cranberry, grape and apple juices Sports drinks like Gatorade Lightly seasoned clear broth or consume(fat free) Sugar, honey syrup  _____   BRUSH YOUR TEETH MORNING OF SURGERY AND RINSE YOUR MOUTH OUT, NO CHEWING  GUM CANDY OR MINTS.     Take these medicines the morning of surgery with A SIP OF WATER: metoprolol, levothyroxine, inhaler bring with you                                  You may not have any metal on your body including hair pins and              piercings  Do not wear jewelry, make-up, lotions, powders or perfumes, deodorant             Do not wear nail polish on your fingernails.  Do not shave  48 hours prior to surgery.            Do not bring valuables to the hospital. [place].  Contacts, dentures or bridgework may not be worn into surgery.      Patients discharged the day of surgery will not be allowed to drive home. IF YOU ARE HAVING SURGERY AND GOING HOME THE SAME DAY, YOU MUST HAVE AN ADULT TO DRIVE YOU HOME AND BE WITH YOU FOR 24 HOURS. YOU MAY GO HOME BY TAXI OR UBER OR ORTHERWISE, BUT AN ADULT MUST ACCOMPANY YOU HOME AND STAY WITH YOU FOR 24 HOURS.  Name and phone number of your driver:  Special Instructions: N/A              Please read over the following fact sheets you were given: _____________________________________________________________________             [facility] - Preparing for Surgery Before surgery, you can play an important role.  Because skin is not sterile, your skin needs to be as free of germs as possible.  You can reduce the number of germs on your skin by washing with CHG (chlorahexidine gluconate) soap before surgery.  CHG is an antiseptic cleaner which kills germs and bonds with the skin to continue killing germs even after washing. Please DO NOT use if you have an allergy to CHG or antibacterial soaps.  If your skin becomes reddened/irritated stop using the CHG and inform your nurse when you arrive at Short Stay. Do not shave (including legs and underarms) for at least 48 hours prior to the first CHG shower.  You may shave your face/neck. Please follow these instructions carefully:  1.  Shower with CHG Soap the night before surgery and the  morning of Surgery.  2.  If you choose to wash your hair, wash your hair first as usual with your  normal  shampoo.  3.   After you shampoo, rinse your hair and body thoroughly to remove the  shampoo.                           4.  Use CHG as you would any other liquid soap.  You can apply chg directly  to the skin and wash                       Gently with a scrungie or clean washcloth.  5.  Apply the CHG Soap to your body ONLY FROM THE NECK DOWN.   Do not use on face/ open  Wound or open sores. Avoid contact with eyes, ears mouth and genitals (private parts).                       Wash face,  Genitals (private parts) with your normal soap.             6.  Wash thoroughly, paying special attention to the area where your surgery  will be performed.  7.  Thoroughly rinse your body with warm water from the neck down.  8.  DO NOT shower/wash with your normal soap after using and rinsing off  the CHG Soap.                9.  Pat yourself dry with a clean towel.            10.  Wear clean pajamas.            11.  Place clean sheets on your bed the night of your first shower and do not  sleep with pets. Day of Surgery : Do not apply any lotions/deodorants the morning of surgery.  Please wear clean clothes to the hospital/surgery center.  FAILURE TO FOLLOW THESE INSTRUCTIONS MAY RESULT IN THE CANCELLATION OF YOUR SURGERY PATIENT SIGNATURE_________________________________  NURSE SIGNATURE__________________________________  ________________________________________________________________________   Adam Phenix  An incentive spirometer is a tool that can help keep your lungs clear and active. This tool measures how well you are filling your lungs with each breath. Taking long deep breaths may help reverse or decrease the chance of developing breathing (pulmonary) problems (especially infection) following:  A long period of time when you are unable to move or be active. BEFORE THE PROCEDURE   If the spirometer includes an indicator to show your best effort, your nurse or respiratory  therapist will set it to a desired goal.  If possible, sit up straight or lean slightly forward. Try not to slouch.  Hold the incentive spirometer in an upright position. INSTRUCTIONS FOR USE  1. Sit on the edge of your bed if possible, or sit up as far as you can in bed or on a chair. 2. Hold the incentive spirometer in an upright position. 3. Breathe out normally. 4. Place the mouthpiece in your mouth and seal your lips tightly around it. 5. Breathe in slowly and as deeply as possible, raising the piston or the ball toward the top of the column. 6. Hold your breath for 3-5 seconds or for as long as possible. Allow the piston or ball to fall to the bottom of the column. 7. Remove the mouthpiece from your mouth and breathe out normally. 8. Rest for a few seconds and repeat Steps 1 through 7 at least 10 times every 1-2 hours when you are awake. Take your time and take a few normal breaths between deep breaths. 9. The spirometer may include an indicator to show your best effort. Use the indicator as a goal to work toward during each repetition. 10. After each set of 10 deep breaths, practice coughing to be sure your lungs are clear. If you have an incision (the cut made at the time of surgery), support your incision when coughing by placing a pillow or rolled up towels firmly against it. Once you are able to get out of bed, walk around indoors and cough well. You may stop using the incentive spirometer when instructed by your caregiver.  RISKS AND COMPLICATIONS  Take your time so you do not get  dizzy or light-headed.  If you are in pain, you may need to take or ask for pain medication before doing incentive spirometry. It is harder to take a deep breath if you are having pain. AFTER USE  Rest and breathe slowly and easily.  It can be helpful to keep track of a log of your progress. Your caregiver can provide you with a simple table to help with this. If you are using the spirometer at home,  follow these instructions: [place] IF:   You are having difficultly using the spirometer.  You have trouble using the spirometer as often as instructed.  Your pain medication is not giving enough relief while using the spirometer.  You develop fever of 100.5 F (38.1 C) or higher. SEEK IMMEDIATE MEDICAL CARE IF:   You cough up bloody sputum that had not been present before.  You develop fever of 102 F (38.9 C) or greater.  You develop worsening pain at or near the incision site. MAKE SURE YOU:   Understand these instructions.  Will watch your condition.  Will get help right away if you are not doing well or get worse. Document Released: 02/19/2007 Document Revised: 01/01/2012 Document Reviewed: 04/22/2007 [facility] Patient Information 2014 [place], [place].   ________________________________________________________________________

## 2020-05-19 NOTE — Progress Notes (Addendum)
PCP - Melena Lovena Le Clearance 04-19-20 on chart Cardiologist - Dr. Bronson Ing clearance epic 04-30-20  PPM/ICD -  Device Orders -  Rep Notified -   Chest x-ray -  EKG - 05-12-20 epic Stress Test -  ECHO - 01-23-18 epic Cardiac Cath -   Sleep Study -  CPAP -   Fasting Blood Sugar -  Checks Blood Sugar _____ times a day  Blood Thinner Instructions:Xarelto hold 3 days Aspirin Instructions:  ERAS Protcol - PRE-SURGERY Ensure  COVID TEST- 7-29  Activity-  Can do own housework without sob limited activity due to knee. Walking 2 miles a day before knee pain Anesthesia review: A fib, HTN, PT 1.54  Patient denies shortness of breath, fever, cough and chest pain at PAT appointment  None    All instructions explained to the patient, with a verbal understanding of the material. Patient agrees to go over the instructions while at home for a better understanding. Patient also instructed to self quarantine after being tested for COVID-19. The opportunity to ask questions was provided.

## 2020-05-19 NOTE — H&P (Addendum)
TOTAL KNEE ADMISSION H&P  Patient is being admitted for right total knee arthroplasty.  Subjective:  Chief Complaint: Right knee pain.  HPI: Ashlee Solomon, 69 y.o. female has a history of pain and functional disability in the right knee due to arthritis and has failed non-surgical conservative treatments for greater than 12 weeks to include corticosteriod injections, viscosupplementation injections and activity modification. Onset of symptoms was gradual, starting several years ago with gradually worsening course since that time. The patient noted no past surgery on the right knee.  Patient currently rates pain in the right knee at 8 out of 10 with activity. Patient has worsening of pain with activity and weight bearing, pain that interferes with activities of daily living, crepitus and instability. Patient has evidence of bone-on-bone arthritis in the medial and patellofemoral compartments by imaging studies. There is no active infection.  Patient Active Problem List   Diagnosis Date Noted  . Hypothyroidism 08/30/2015  . Peripheral edema 08/30/2015  . Morbid obesity ([place]) 08/30/2015  . Postop Hypokalemia 06/06/2012  . Postop Hyponatremia 06/05/2012  . Expected Blood loss anemia 06/05/2012  . OA (osteoarthritis) of knee 06/03/2012    Past Medical History:  Diagnosis Date  . Arthritis   . DDD (degenerative disc disease)   . GERD (gastroesophageal reflux disease)   . Hypertension    clearance Dr Wolfgang Phoenix with note on chart  . Hypothyroidism   . Pneumonia 1/13  . Reactive airway disease     Past Surgical History:  Procedure Laterality Date  . APPENDECTOMY    . CESAREAN SECTION     x 3  . COLONOSCOPY N/A 04/14/2015   Procedure: COLONOSCOPY;  Surgeon: Rogene Houston, MD;  Location: [facility] SUITE;  Service: Endoscopy;  Laterality: N/A;  830 - moved to 11:30 - Ann to notify pt  . TONSILLECTOMY    . TOTAL KNEE ARTHROPLASTY  06/03/2012   Procedure: TOTAL KNEE ARTHROPLASTY;  Surgeon:  Gearlean Alf, MD;  Location: [facility] ORS;  Service: Orthopedics;  Laterality: Left;    Prior to Admission medications   Medication Sig Start Date End Date Taking? Authorizing Provider  acetaminophen (TYLENOL) 500 MG tablet Take 1,000 mg by mouth every 6 (six) hours as needed for mild pain or headache.   Yes [provider]  albuterol (VENTOLIN HFA) 108 (90 Base) MCG/ACT inhaler TAKE 2 PUFFS BY MOUTH EVERY 4 HOURS AS NEEDED FOR WHEEZE Patient taking differently: Inhale 2 puffs into the lungs every 4 (four) hours as needed for wheezing.  07/22/19  Yes Mikey Kirschner, MD  Ascorbic Acid (VITAMIN C) 1000 MG tablet Take 1,000 mg by mouth daily.   Yes [provider]  b complex vitamins tablet Take 1 tablet by mouth daily.   Yes [provider]  CALCIUM-MAGNESIUM-ZINC PO Take 2 tablets by mouth at bedtime.   Yes [provider]  cetirizine (ZYRTEC) 10 MG tablet Take 10 mg by mouth daily as needed for allergies.    Yes [provider]  Cholecalciferol (VITAMIN D-3) 125 MCG (5000 UT) TABS Take 5,000 Units by mouth daily.   Yes [provider]  Coenzyme Q10 (CO Q 10) 100 MG CAPS Take 100 mg by mouth at bedtime.   Yes [provider]  furosemide (LASIX) 20 MG tablet Take 1 tablet (20 mg total) by mouth daily. 10/10/19  Yes Mikey Kirschner, MD  ibuprofen (ADVIL) 200 MG tablet Take 400 mg by mouth every 6 (six) hours as needed for headache,  mild pain or moderate pain.   Yes [provider]  levothyroxine (SYNTHROID) 175 MCG tablet TAKE 1 TABLET BY MOUTH EVERY DAY BEFORE BREAKFAST Patient taking differently: Take 175 mcg by mouth daily before breakfast.  03/30/20  Yes Lovena Le, Malena M, DO  loratadine (CLARITIN) 10 MG tablet Take 10 mg by mouth daily as needed for allergies.    Yes [provider]  Lutein 10 MG TABS Take 10 mg by mouth daily.   Yes [provider]  metoprolol succinate (TOPROL-XL) 50 MG 24 hr tablet TAKE 1  TABLET BY MOUTH DAILY. TAKE WITH OR IMMEDIATELY FOLLOWING A MEAL. Patient taking differently: Take 50 mg by mouth daily.  03/31/20  Yes Lovena Le, Malena M, DO  potassium chloride (KLOR-CON M10) 10 MEQ tablet Take 1 tablet (10 mEq total) by mouth daily. 10/10/19  Yes Mikey Kirschner, MD  XARELTO 15 MG TABS tablet TAKE 1 TABLET (15 MG TOTAL) BY MOUTH DAILY WITH SUPPER. Patient taking differently: Take 15 mg by mouth daily with supper.  01/07/20  Yes Herminio Commons, MD    No Known Allergies  Social History   Socioeconomic History  . Marital status: Married    Spouse name: Not on file  . Number of children: Not on file  . Years of education: Not on file  . Highest education level: Not on file  Occupational History  . Not on file  Tobacco Use  . Smoking status: Former Smoker    Packs/day: 0.25    Types: Cigarettes    Quit date: 05/25/2007    Years since quitting: 12.9  . Smokeless tobacco: Never Used  Substance and Sexual Activity  . Alcohol use: Yes    Comment: mixed drink nightly  . Drug use: No  . Sexual activity: Not on file  Other Topics Concern  . Not on file  Social History Narrative  . Not on file   Social Determinants of Health   Financial Resource Strain:   . Difficulty of Paying Living Expenses:   Food Insecurity:   . Worried About Charity fundraiser in the Last Year:   . Arboriculturist in the Last Year:   Transportation Needs:   . Film/video editor (Medical):   Marland Kitchen Lack of Transportation (Non-Medical):   Physical Activity:   . Days of Exercise per Week:   . Minutes of Exercise per Session:   Stress:   . Feeling of Stress :   Social Connections:   . Frequency of Communication with Friends and Family:   . Frequency of Social Gatherings with Friends and Family:   . Attends Religious Services:   . Active Member of Clubs or Organizations:   . Attends Archivist Meetings:   Marland Kitchen Marital Status:   Intimate Partner Violence:   . Fear of Current  or Ex-Partner:   . Emotionally Abused:   Marland Kitchen Physically Abused:   . Sexually Abused:       Tobacco Use: Medium Risk  . Smoking Tobacco Use: Former Smoker  . Smokeless Tobacco Use: Never Used   Social History   Substance and Sexual Activity  Alcohol Use Yes   Comment: mixed drink nightly    Family History  Problem Relation Age of Onset  . Leukemia Mother   . Heart disease Father        pacemaker  . Breast cancer Sister        history  . Heart failure Brother   . Diabetes  Brother   . Melanoma Maternal Grandmother   . Suicidality Maternal Grandfather   . Stroke Paternal Grandmother   . Stroke Paternal Grandfather   . Hodgkin's lymphoma Brother     Review of Systems  Constitutional: Negative for chills and fever.  HENT: Negative for congestion, sore throat and tinnitus.   Eyes: Negative for double vision, photophobia and pain.  Respiratory: Negative for cough, shortness of breath and wheezing.   Cardiovascular: Negative for chest pain, palpitations and orthopnea.  Gastrointestinal: Negative for heartburn, nausea and vomiting.  Genitourinary: Negative for dysuria, frequency and urgency.  Musculoskeletal: Positive for joint pain.  Neurological: Negative for dizziness, weakness and headaches.    Objective:  Physical Exam: Well nourished and well developed.  General: Alert and oriented x3, cooperative and pleasant, no acute distress.  Head: normocephalic, atraumatic, neck supple.  Eyes: EOMI.  Respiratory: breath sounds clear in all fields, no wheezing, rales, or rhonchi. Cardiovascular: Regular rate and rhythm, no murmurs, gallops or rubs.  Abdomen: non-tender to palpation and soft, normoactive bowel sounds. Musculoskeletal:  Right Knee Exam:  No effusion present. No swelling present.  The range of motion is: 5 to 125 degrees.  Moderate crepitus on range of motion of the knee.  Medial and lateral joint line tenderness.  The knee is stable.   Calves soft and  nontender. Motor function intact in LE. Strength 5/5 LE bilaterally. Neuro: Distal pulses 2+. Sensation to light touch intact in LE.  Imaging Review Plain radiographs demonstrate severe degenerative joint disease of the right knee. The overall alignment is neutral. The bone quality appears to be adequate for age and reported activity level.  Assessment/Plan:  End stage arthritis, right knee   The patient history, physical examination, clinical judgment of the provider and imaging studies are consistent with end stage degenerative joint disease of the right knee and total knee arthroplasty is deemed medically necessary. The treatment options including medical management, injection therapy arthroscopy and arthroplasty were discussed at length. The risks and benefits of total knee arthroplasty were presented and reviewed. The risks due to aseptic loosening, infection, stiffness, patella tracking problems, thromboembolic complications and other imponderables were discussed. The patient acknowledged the explanation, agreed to proceed with the plan and consent was signed. Patient is being admitted for inpatient treatment for surgery, pain control, PT, OT, prophylactic antibiotics, VTE prophylaxis, progressive ambulation and ADLs and discharge planning. The patient is planning to be discharged home.   Patient's anticipated LOS is less than 2 midnights, meeting these requirements: - Lives within 1 hour of care - Has a competent adult at home to recover with post-op recover - NO history of  - Chronic pain requiring opioids  - Diabetes  - Coronary Artery Disease  - Heart failure  - Heart attack  - Stroke  - DVT/VTE  - Cardiac arrhythmia  - Respiratory Failure/COPD  - Renal failure  - Anemia  - Advanced Liver disease  Therapy Plans: Outpatient therapy at [facility] in [place] Disposition: Home with husband Planned DVT Prophylaxis: Xarelto 15 mg QD DME Needed: None PCP: Elvia Collum, DO  (clearance provided) Cardiologist: Kate Sable, MD (clearance provided) TXA: IV Allergies: NKDA Anesthesia Concerns: None BMI: 37.4 Last HgbA1c: Not diabetic  - Patient was instructed on what medications to stop prior to surgery. - Follow-up visit in 2 weeks with Dr. Wynelle Link - Begin physical therapy following surgery - Pre-operative lab work as pre-surgical testing - Prescriptions will be provided in hospital at time of discharge  [facility],  PA-C Orthopedic Surgery [facility] [place]  a

## 2020-05-20 ENCOUNTER — Other Ambulatory Visit (HOSPITAL_COMMUNITY)
Admission: RE | Admit: 2020-05-20 | Discharge: 2020-05-20 | Disposition: A | Discharge status: Home / Self Care | Payer: Medicare Other | Source: Ambulatory Visit | Attending: Orthopedic Surgery | Admitting: Orthopedic Surgery

## 2020-05-20 ENCOUNTER — Other Ambulatory Visit: Payer: Self-pay

## 2020-05-20 ENCOUNTER — Encounter (HOSPITAL_COMMUNITY): Payer: Self-pay

## 2020-05-20 ENCOUNTER — Encounter (HOSPITAL_COMMUNITY)
Admission: RE | Admit: 2020-05-20 | Discharge: 2020-05-20 | Disposition: A | Discharge status: Home / Self Care | Payer: Medicare Other | Source: Ambulatory Visit | Attending: Orthopedic Surgery | Admitting: Orthopedic Surgery

## 2020-05-20 DIAGNOSIS — Z01818 Encounter for other preprocedural examination: Secondary | ICD-10-CM | POA: Insufficient documentation

## 2020-05-20 DIAGNOSIS — Z01812 Encounter for preprocedural laboratory examination: Secondary | ICD-10-CM | POA: Diagnosis not present

## 2020-05-20 DIAGNOSIS — Z20822 Contact with and (suspected) exposure to covid-19: Secondary | ICD-10-CM | POA: Diagnosis not present

## 2020-05-20 HISTORY — DX: Cardiac arrhythmia, unspecified: I49.9

## 2020-05-20 HISTORY — DX: Unspecified asthma, uncomplicated: J45.909

## 2020-05-20 LAB — COMPREHENSIVE METABOLIC PANEL
ALT: 18 U/L (ref 0–44)
AST: 32 U/L (ref 15–41)
Albumin: 4.2 g/dL (ref 3.5–5.0)
Alkaline Phosphatase: 77 U/L (ref 38–126)
Anion gap: 9 (ref 5–15)
BUN: 21 mg/dL (ref 8–23)
CO2: 27 mmol/L (ref 22–32)
Calcium: 9.8 mg/dL (ref 8.9–10.3)
Chloride: 103 mmol/L (ref 98–111)
Creatinine, Ser: 0.85 mg/dL (ref 0.44–1.00)
GFR calc Af Amer: >60 mL/min (ref >60)
GFR calc non Af Amer: >60 mL/min (ref >60)
Glucose, Bld: 90 mg/dL (ref 70–99)
Potassium: 4.3 mmol/L (ref 3.5–5.1)
Sodium: 139 mmol/L (ref 135–145)
Total Bilirubin: 0.6 mg/dL (ref 0.3–1.2)
Total Protein: 6.9 g/dL (ref 6.5–8.1)

## 2020-05-20 LAB — CBC
HCT: 40 % (ref 36.0–46.0)
Hemoglobin: 12.9 g/dL (ref 12.0–15.0)
MCH: 30.1 pg (ref 26.0–34.0)
MCHC: 32.3 g/dL (ref 30.0–36.0)
MCV: 93.2 fL (ref 80.0–100.0)
Platelets: 232 10*3/uL (ref 150–400)
RBC: 4.29 MIL/uL (ref 3.87–5.11)
RDW: 14.3 % (ref 11.5–15.5)
WBC: 7 10*3/uL (ref 4.0–10.5)
nRBC: 0 % (ref 0.0–0.2)

## 2020-05-20 LAB — SARS CORONAVIRUS 2 (TAT 6-24 HRS): SARS Coronavirus 2: NEGATIVE

## 2020-05-20 LAB — SURGICAL PCR SCREEN
MRSA, PCR: NEGATIVE
Staphylococcus aureus: NEGATIVE

## 2020-05-20 LAB — PROTIME-INR
INR: 1.3 — ABNORMAL HIGH (ref 0.8–1.2)
Prothrombin Time: 15.4 seconds — ABNORMAL HIGH (ref 11.4–15.2)

## 2020-05-20 LAB — APTT: aPTT: 38 seconds — ABNORMAL HIGH (ref 24–36)

## 2020-05-21 NOTE — Anesthesia Preprocedure Evaluation (Addendum)
Anesthesia Evaluation  Patient identified by MRN, date of birth, ID band Patient awake  General Assessment Comment:DCed Xarelto for alloted time.  Reviewed: Allergy & Precautions, NPO status   Airway Mallampati: II  TM Distance: >3 FB     Dental   Pulmonary asthma , pneumonia, former smoker,    breath sounds clear to auscultation       Cardiovascular hypertension, + dysrhythmias Atrial Fibrillation  Rhythm:Regular Rate:Normal     Neuro/Psych    GI/Hepatic GERD  ,  Endo/Other  Hypothyroidism   Renal/GU      Musculoskeletal  (+) Arthritis ,   Abdominal   Peds  Hematology  (+) anemia ,   Anesthesia Other Findings   Reproductive/Obstetrics                            Anesthesia Physical Anesthesia Plan  ASA: III  Anesthesia Plan: Spinal   Post-op Pain Management:  Regional for Post-op pain   Induction: Intravenous  PONV Risk Score and Plan:   Airway Management Planned: Simple Face Mask  Additional Equipment:   Intra-op Plan:   Post-operative Plan:   Informed Consent: I have reviewed the patients History and Physical, chart, labs and discussed the procedure including the risks, benefits and alternatives for the proposed anesthesia with the patient or authorized representative who has indicated his/her understanding and acceptance.     Dental advisory given  Plan Discussed with: CRNA and Anesthesiologist  Anesthesia Plan Comments: (Per Cardiology, "Primary Cardiologist: Kate Sable, MD Chart reviewed as part of pre-operative protocol coverage.  Per pharmacy recommendations, patient can hold xarelto 3 days prior to her planned procedure with plans to restart as soon as she is cleared to do so by her orthopedist.")       Anesthesia Quick Evaluation

## 2020-05-23 MED ORDER — BUPIVACAINE LIPOSOME 1.3 % IJ SUSP
20.0000 mL | INTRAMUSCULAR | Status: DC
  Filled 2020-05-23: qty 20

## 2020-05-24 ENCOUNTER — Inpatient Hospital Stay (HOSPITAL_COMMUNITY): Payer: Medicare Other | Admitting: Anesthesiology

## 2020-05-24 ENCOUNTER — Other Ambulatory Visit: Payer: Self-pay

## 2020-05-24 ENCOUNTER — Encounter (HOSPITAL_COMMUNITY): Payer: Self-pay | Admitting: Orthopedic Surgery

## 2020-05-24 ENCOUNTER — Observation Stay (HOSPITAL_COMMUNITY)
Admission: RE | Admit: 2020-05-24 | Discharge: 2020-05-25 | Disposition: A | Discharge status: Home / Self Care | Payer: Medicare Other | Attending: Orthopedic Surgery | Admitting: Orthopedic Surgery

## 2020-05-24 ENCOUNTER — Encounter (HOSPITAL_COMMUNITY)
Admission: RE | Disposition: A | Discharge status: Home / Self Care | Payer: Self-pay | Source: Home / Self Care | Attending: Orthopedic Surgery

## 2020-05-24 ENCOUNTER — Inpatient Hospital Stay (HOSPITAL_COMMUNITY): Payer: Medicare Other | Admitting: Physician Assistant

## 2020-05-24 DIAGNOSIS — Z96652 Presence of left artificial knee joint: Secondary | ICD-10-CM | POA: Insufficient documentation

## 2020-05-24 DIAGNOSIS — I10 Essential (primary) hypertension: Secondary | ICD-10-CM | POA: Diagnosis not present

## 2020-05-24 DIAGNOSIS — D638 Anemia in other chronic diseases classified elsewhere: Secondary | ICD-10-CM | POA: Diagnosis not present

## 2020-05-24 DIAGNOSIS — Z79899 Other long term (current) drug therapy: Secondary | ICD-10-CM | POA: Diagnosis not present

## 2020-05-24 DIAGNOSIS — Z20822 Contact with and (suspected) exposure to covid-19: Secondary | ICD-10-CM | POA: Insufficient documentation

## 2020-05-24 DIAGNOSIS — E039 Hypothyroidism, unspecified: Secondary | ICD-10-CM | POA: Insufficient documentation

## 2020-05-24 DIAGNOSIS — M25561 Pain in right knee: Secondary | ICD-10-CM | POA: Diagnosis present

## 2020-05-24 DIAGNOSIS — Z87891 Personal history of nicotine dependence: Secondary | ICD-10-CM | POA: Diagnosis not present

## 2020-05-24 DIAGNOSIS — M1711 Unilateral primary osteoarthritis, right knee: Principal | ICD-10-CM | POA: Diagnosis present

## 2020-05-24 DIAGNOSIS — M1710 Unilateral primary osteoarthritis, unspecified knee: Secondary | ICD-10-CM | POA: Diagnosis present

## 2020-05-24 DIAGNOSIS — G8918 Other acute postprocedural pain: Secondary | ICD-10-CM | POA: Diagnosis not present

## 2020-05-24 DIAGNOSIS — M179 Osteoarthritis of knee, unspecified: Secondary | ICD-10-CM | POA: Diagnosis present

## 2020-05-24 HISTORY — PX: TOTAL KNEE ARTHROPLASTY: SHX125

## 2020-05-24 LAB — TYPE AND SCREEN
ABO/RH(D): O NEG
Antibody Screen: NEGATIVE

## 2020-05-24 SURGERY — ARTHROPLASTY, KNEE, TOTAL
Anesthesia: Spinal | Site: Knee | Laterality: Right

## 2020-05-24 MED ORDER — SODIUM CHLORIDE 0.9 % IR SOLN
Status: DC | PRN
  Administered 2020-05-24: 1000 mL

## 2020-05-24 MED ORDER — ALBUTEROL SULFATE (2.5 MG/3ML) 0.083% IN NEBU: 3.0000 mL | INHALATION_SOLUTION | RESPIRATORY_TRACT | Status: DC | PRN

## 2020-05-24 MED ORDER — ONDANSETRON HCL 4 MG/2ML IJ SOLN: 4.0000 mg | Freq: Four times a day (QID) | INTRAMUSCULAR | Status: DC | PRN

## 2020-05-24 MED ORDER — METHOCARBAMOL 500 MG PO TABS
500.0000 mg | ORAL_TABLET | Freq: Four times a day (QID) | ORAL | Status: DC | PRN
  Administered 2020-05-24 – 2020-05-25 (×4): 500 mg via ORAL
  Filled 2020-05-24 (×4): qty 1

## 2020-05-24 MED ORDER — FENTANYL CITRATE (PF) 100 MCG/2ML IJ SOLN
50.0000 ug | INTRAMUSCULAR | Status: AC
  Administered 2020-05-24: 100 ug via INTRAVENOUS
  Filled 2020-05-24: qty 2

## 2020-05-24 MED ORDER — SODIUM CHLORIDE (PF) 0.9 % IJ SOLN
INTRAMUSCULAR | Status: DC | PRN
  Administered 2020-05-24: 60 mL

## 2020-05-24 MED ORDER — ONDANSETRON HCL 4 MG/2ML IJ SOLN
INTRAMUSCULAR | Status: AC
  Filled 2020-05-24: qty 2

## 2020-05-24 MED ORDER — PHENOL 1.4 % MT LIQD: 1.0000 | OROMUCOSAL | Status: DC | PRN

## 2020-05-24 MED ORDER — DEXAMETHASONE SODIUM PHOSPHATE 10 MG/ML IJ SOLN
10.0000 mg | Freq: Once | INTRAMUSCULAR | Status: AC
  Administered 2020-05-25: 10 mg via INTRAVENOUS
  Filled 2020-05-24: qty 1

## 2020-05-24 MED ORDER — STERILE WATER FOR IRRIGATION IR SOLN
Status: DC | PRN
  Administered 2020-05-24: 2000 mL

## 2020-05-24 MED ORDER — RIVAROXABAN 10 MG PO TABS
10.0000 mg | ORAL_TABLET | Freq: Every day | ORAL | Status: DC
  Administered 2020-05-25: 10 mg via ORAL
  Filled 2020-05-24: qty 1

## 2020-05-24 MED ORDER — PHENYLEPHRINE HCL (PRESSORS) 10 MG/ML IV SOLN
INTRAVENOUS | Status: DC | PRN
Start: 2020-05-24 — End: 2020-05-24
  Administered 2020-05-24: 40 ug via INTRAVENOUS
  Administered 2020-05-24: 80 ug via INTRAVENOUS
  Administered 2020-05-24: 40 ug via INTRAVENOUS
  Administered 2020-05-24: 80 ug via INTRAVENOUS

## 2020-05-24 MED ORDER — PROPOFOL 500 MG/50ML IV EMUL
INTRAVENOUS | Status: AC
  Filled 2020-05-24: qty 50

## 2020-05-24 MED ORDER — TRANEXAMIC ACID-NACL 1000-0.7 MG/100ML-% IV SOLN
1000.0000 mg | INTRAVENOUS | Status: AC
  Administered 2020-05-24: 1000 mg via INTRAVENOUS
  Filled 2020-05-24: qty 100

## 2020-05-24 MED ORDER — METOPROLOL SUCCINATE ER 50 MG PO TB24
50.0000 mg | ORAL_TABLET | Freq: Every day | ORAL | Status: DC
  Administered 2020-05-25: 50 mg via ORAL
  Filled 2020-05-24: qty 1

## 2020-05-24 MED ORDER — LACTATED RINGERS IV SOLN: INTRAVENOUS | Status: DC

## 2020-05-24 MED ORDER — DIPHENHYDRAMINE HCL 12.5 MG/5ML PO ELIX: 12.5000 mg | ORAL_SOLUTION | ORAL | Status: DC | PRN

## 2020-05-24 MED ORDER — ORAL CARE MOUTH RINSE: 15.0000 mL | Freq: Once | OROMUCOSAL | Status: AC

## 2020-05-24 MED ORDER — MIDAZOLAM HCL 2 MG/2ML IJ SOLN
INTRAMUSCULAR | Status: AC
  Filled 2020-05-24: qty 2

## 2020-05-24 MED ORDER — PROPOFOL 500 MG/50ML IV EMUL
INTRAVENOUS | Status: DC | PRN
  Administered 2020-05-24: 50 ug/kg/min via INTRAVENOUS

## 2020-05-24 MED ORDER — POLYETHYLENE GLYCOL 3350 17 G PO PACK: 17.0000 g | PACK | Freq: Every day | ORAL | Status: DC | PRN

## 2020-05-24 MED ORDER — SODIUM CHLORIDE 0.9 % IV SOLN: INTRAVENOUS | Status: DC

## 2020-05-24 MED ORDER — METHOCARBAMOL 500 MG IVPB - SIMPLE MED
500.0000 mg | Freq: Four times a day (QID) | INTRAVENOUS | Status: DC | PRN
  Filled 2020-05-24: qty 50

## 2020-05-24 MED ORDER — SODIUM CHLORIDE (PF) 0.9 % IJ SOLN
INTRAMUSCULAR | Status: AC
  Filled 2020-05-24: qty 50

## 2020-05-24 MED ORDER — BUPIVACAINE LIPOSOME 1.3 % IJ SUSP
INTRAMUSCULAR | Status: DC | PRN
  Administered 2020-05-24: 20 mL

## 2020-05-24 MED ORDER — POTASSIUM CHLORIDE CRYS ER 10 MEQ PO TBCR
10.0000 meq | EXTENDED_RELEASE_TABLET | Freq: Every day | ORAL | Status: DC
  Administered 2020-05-24 – 2020-05-25 (×2): 10 meq via ORAL
  Filled 2020-05-24 (×2): qty 1

## 2020-05-24 MED ORDER — METOCLOPRAMIDE HCL 5 MG/ML IJ SOLN: 5.0000 mg | Freq: Three times a day (TID) | INTRAMUSCULAR | Status: DC | PRN

## 2020-05-24 MED ORDER — TRAMADOL HCL 50 MG PO TABS
50.0000 mg | ORAL_TABLET | Freq: Four times a day (QID) | ORAL | Status: DC | PRN
  Administered 2020-05-24: 50 mg via ORAL
  Administered 2020-05-25: 100 mg via ORAL
  Administered 2020-05-25 (×2): 50 mg via ORAL
  Filled 2020-05-24: qty 2
  Filled 2020-05-24 (×3): qty 1

## 2020-05-24 MED ORDER — ONDANSETRON HCL 4 MG/2ML IJ SOLN
INTRAMUSCULAR | Status: DC | PRN
  Administered 2020-05-24: 4 mg via INTRAVENOUS

## 2020-05-24 MED ORDER — PHENYLEPHRINE HCL-NACL 10-0.9 MG/250ML-% IV SOLN
INTRAVENOUS | Status: DC | PRN
Start: 2020-05-24 — End: 2020-05-24
  Administered 2020-05-24: 35 ug/min via INTRAVENOUS

## 2020-05-24 MED ORDER — MIDAZOLAM HCL 2 MG/2ML IJ SOLN
1.0000 mg | INTRAMUSCULAR | Status: AC
  Administered 2020-05-24: 2 mg via INTRAVENOUS
  Filled 2020-05-24: qty 2

## 2020-05-24 MED ORDER — FENTANYL CITRATE (PF) 100 MCG/2ML IJ SOLN
INTRAMUSCULAR | Status: AC
  Filled 2020-05-24: qty 2

## 2020-05-24 MED ORDER — ACETAMINOPHEN 10 MG/ML IV SOLN
1000.0000 mg | Freq: Four times a day (QID) | INTRAVENOUS | Status: DC
  Administered 2020-05-24: 1000 mg via INTRAVENOUS
  Filled 2020-05-24: qty 100

## 2020-05-24 MED ORDER — CEFAZOLIN SODIUM-DEXTROSE 2-4 GM/100ML-% IV SOLN
2.0000 g | Freq: Four times a day (QID) | INTRAVENOUS | Status: AC
  Administered 2020-05-24 (×2): 2 g via INTRAVENOUS
  Filled 2020-05-24 (×2): qty 100

## 2020-05-24 MED ORDER — ONDANSETRON HCL 4 MG PO TABS: 4.0000 mg | ORAL_TABLET | Freq: Four times a day (QID) | ORAL | Status: DC | PRN

## 2020-05-24 MED ORDER — CHLORHEXIDINE GLUCONATE 0.12 % MT SOLN
15.0000 mL | Freq: Once | OROMUCOSAL | Status: AC
  Administered 2020-05-24: 15 mL via OROMUCOSAL

## 2020-05-24 MED ORDER — PHENYLEPHRINE HCL (PRESSORS) 10 MG/ML IV SOLN
INTRAVENOUS | Status: AC
  Filled 2020-05-24: qty 1

## 2020-05-24 MED ORDER — MIDAZOLAM HCL 5 MG/5ML IJ SOLN
INTRAMUSCULAR | Status: DC | PRN
  Administered 2020-05-24 (×2): .5 mg via INTRAVENOUS

## 2020-05-24 MED ORDER — OXYCODONE HCL 5 MG PO TABS
5.0000 mg | ORAL_TABLET | ORAL | Status: DC | PRN
  Administered 2020-05-24 – 2020-05-25 (×6): 5 mg via ORAL
  Filled 2020-05-24 (×6): qty 1

## 2020-05-24 MED ORDER — LIDOCAINE HCL (CARDIAC) PF 100 MG/5ML IV SOSY
PREFILLED_SYRINGE | INTRAVENOUS | Status: DC | PRN
  Administered 2020-05-24: 40 mg via INTRAVENOUS

## 2020-05-24 MED ORDER — LEVOTHYROXINE SODIUM 50 MCG PO TABS
175.0000 ug | ORAL_TABLET | Freq: Every day | ORAL | Status: DC
  Administered 2020-05-25: 175 ug via ORAL
  Filled 2020-05-24: qty 1

## 2020-05-24 MED ORDER — ACETAMINOPHEN 500 MG PO TABS
1000.0000 mg | ORAL_TABLET | Freq: Four times a day (QID) | ORAL | Status: AC
  Administered 2020-05-24 – 2020-05-25 (×4): 1000 mg via ORAL
  Filled 2020-05-24 (×4): qty 2

## 2020-05-24 MED ORDER — MORPHINE SULFATE (PF) 2 MG/ML IV SOLN: 0.5000 mg | INTRAVENOUS | Status: DC | PRN

## 2020-05-24 MED ORDER — DEXAMETHASONE SODIUM PHOSPHATE 10 MG/ML IJ SOLN
INTRAMUSCULAR | Status: AC
  Filled 2020-05-24: qty 1

## 2020-05-24 MED ORDER — GABAPENTIN 300 MG PO CAPS
300.0000 mg | ORAL_CAPSULE | Freq: Three times a day (TID) | ORAL | Status: DC
  Administered 2020-05-24 – 2020-05-25 (×4): 300 mg via ORAL
  Filled 2020-05-24 (×4): qty 1

## 2020-05-24 MED ORDER — SODIUM CHLORIDE (PF) 0.9 % IJ SOLN
INTRAMUSCULAR | Status: AC
  Filled 2020-05-24: qty 10

## 2020-05-24 MED ORDER — DEXAMETHASONE SODIUM PHOSPHATE 10 MG/ML IJ SOLN: 8.0000 mg | Freq: Once | INTRAMUSCULAR | Status: DC

## 2020-05-24 MED ORDER — MENTHOL 3 MG MT LOZG: 1.0000 | LOZENGE | OROMUCOSAL | Status: DC | PRN

## 2020-05-24 MED ORDER — FUROSEMIDE 20 MG PO TABS
20.0000 mg | ORAL_TABLET | Freq: Every day | ORAL | Status: DC
  Administered 2020-05-25: 20 mg via ORAL
  Filled 2020-05-24: qty 1

## 2020-05-24 MED ORDER — FLEET ENEMA 7-19 GM/118ML RE ENEM: 1.0000 | ENEMA | Freq: Once | RECTAL | Status: DC | PRN

## 2020-05-24 MED ORDER — POVIDONE-IODINE 10 % EX SWAB
2.0000 "application " | Freq: Once | CUTANEOUS | Status: AC
  Administered 2020-05-24: 2 via TOPICAL

## 2020-05-24 MED ORDER — BISACODYL 10 MG RE SUPP: 10.0000 mg | Freq: Every day | RECTAL | Status: DC | PRN

## 2020-05-24 MED ORDER — DOCUSATE SODIUM 100 MG PO CAPS
100.0000 mg | ORAL_CAPSULE | Freq: Two times a day (BID) | ORAL | Status: DC
  Administered 2020-05-24 – 2020-05-25 (×2): 100 mg via ORAL
  Filled 2020-05-24 (×2): qty 1

## 2020-05-24 MED ORDER — METOCLOPRAMIDE HCL 5 MG PO TABS: 5.0000 mg | ORAL_TABLET | Freq: Three times a day (TID) | ORAL | Status: DC | PRN

## 2020-05-24 MED ORDER — PROPOFOL 10 MG/ML IV BOLUS
INTRAVENOUS | Status: AC
  Filled 2020-05-24: qty 20

## 2020-05-24 MED ORDER — CEFAZOLIN SODIUM-DEXTROSE 2-4 GM/100ML-% IV SOLN
2.0000 g | INTRAVENOUS | Status: AC
  Administered 2020-05-24: 2 g via INTRAVENOUS
  Filled 2020-05-24: qty 100

## 2020-05-24 MED ORDER — FENTANYL CITRATE (PF) 100 MCG/2ML IJ SOLN
INTRAMUSCULAR | Status: DC | PRN
  Administered 2020-05-24: 25 ug via INTRAVENOUS

## 2020-05-24 MED ORDER — 0.9 % SODIUM CHLORIDE (POUR BTL) OPTIME
TOPICAL | Status: DC | PRN
  Administered 2020-05-24: 1000 mL

## 2020-05-24 SURGICAL SUPPLY — 58 items
ATTUNE PS FEM RT SZ 5 CEM KNEE (Femur) ×2 IMPLANT
ATTUNE PSRP INSR SZ5 8 KNEE (Insert) ×1 IMPLANT
ATTUNE PSRP INSR SZ5 8MM KNEE (Insert) ×1 IMPLANT
BAG ZIPLOCK 12X15 (MISCELLANEOUS) ×3 IMPLANT
BASE TIBIAL ROT PLAT SZ 5 KNEE (Knees) IMPLANT
BLADE SAG 18X100X1.27 (BLADE) ×3 IMPLANT
BLADE SAW SGTL 11.0X1.19X90.0M (BLADE) ×3 IMPLANT
BLADE SURG SZ10 CARB STEEL (BLADE) ×6 IMPLANT
BNDG ELASTIC 6X5.8 VLCR STR LF (GAUZE/BANDAGES/DRESSINGS) ×3 IMPLANT
BOWL SMART MIX CTS (DISPOSABLE) ×3 IMPLANT
CEMENT HV SMART SET (Cement) ×6 IMPLANT
CLOSURE STERI-STRIP 1/2X4 (GAUZE/BANDAGES/DRESSINGS) ×1
CLOSURE WOUND 1/2 X4 (GAUZE/BANDAGES/DRESSINGS)
CLSR STERI-STRIP ANTIMIC 1/2X4 (GAUZE/BANDAGES/DRESSINGS) ×1 IMPLANT
COVER SURGICAL LIGHT HANDLE (MISCELLANEOUS) ×3 IMPLANT
COVER WAND RF STERILE (DRAPES) ×2 IMPLANT
CUFF TOURN SGL QUICK 34 (TOURNIQUET CUFF) ×3
CUFF TRNQT CYL 34X4.125X (TOURNIQUET CUFF) ×1 IMPLANT
DECANTER SPIKE VIAL GLASS SM (MISCELLANEOUS) ×5 IMPLANT
DRAPE U-SHAPE 47X51 STRL (DRAPES) ×3 IMPLANT
DRSG AQUACEL AG ADV 3.5X10 (GAUZE/BANDAGES/DRESSINGS) ×3 IMPLANT
DURAPREP 26ML APPLICATOR (WOUND CARE) ×3 IMPLANT
ELECT REM PT RETURN 15FT ADLT (MISCELLANEOUS) ×3 IMPLANT
GLOVE BIO SURGEON STRL SZ7 (GLOVE) ×3 IMPLANT
GLOVE BIO SURGEON STRL SZ8 (GLOVE) ×3 IMPLANT
GLOVE BIOGEL PI IND STRL 7.0 (GLOVE) ×1 IMPLANT
GLOVE BIOGEL PI IND STRL 8 (GLOVE) ×1 IMPLANT
GLOVE BIOGEL PI INDICATOR 7.0 (GLOVE) ×2
GLOVE BIOGEL PI INDICATOR 8 (GLOVE) ×2
GOWN STRL REUS W/TWL LRG LVL3 (GOWN DISPOSABLE) ×6 IMPLANT
HANDPIECE INTERPULSE COAX TIP (DISPOSABLE) ×3
HOLDER FOLEY CATH W/STRAP (MISCELLANEOUS) ×2 IMPLANT
IMMOBILIZER KNEE 20 (SOFTGOODS) ×3 IMPLANT
IMMOBILIZER KNEE 20 THIGH 36 (SOFTGOODS) ×1 IMPLANT
IMMOBILIZER KNEE 22 UNIV (SOFTGOODS) ×2 IMPLANT
KIT TURNOVER KIT A (KITS) IMPLANT
MANIFOLD NEPTUNE II (INSTRUMENTS) ×3 IMPLANT
NS IRRIG 1000ML POUR BTL (IV SOLUTION) ×3 IMPLANT
PACK TOTAL KNEE CUSTOM (KITS) ×3 IMPLANT
PADDING CAST ABS 6INX4YD NS (CAST SUPPLIES) ×2
PADDING CAST ABS COTTON 6X4 NS (CAST SUPPLIES) IMPLANT
PADDING CAST COTTON 6X4 STRL (CAST SUPPLIES) ×3 IMPLANT
PATELLA MEDIAL ATTUN 35MM KNEE (Knees) ×2 IMPLANT
PENCIL SMOKE EVACUATOR (MISCELLANEOUS) ×3 IMPLANT
PIN DRILL FIX HALF THREAD (BIT) ×2 IMPLANT
PIN STEINMAN FIXATION KNEE (PIN) ×2 IMPLANT
PROTECTOR NERVE ULNAR (MISCELLANEOUS) ×3 IMPLANT
SET HNDPC FAN SPRY TIP SCT (DISPOSABLE) ×1 IMPLANT
STRIP CLOSURE SKIN 1/2X4 (GAUZE/BANDAGES/DRESSINGS) ×2 IMPLANT
SUT MNCRL AB 4-0 PS2 18 (SUTURE) ×3 IMPLANT
SUT STRATAFIX 0 PDS 27 VIOLET (SUTURE) ×3
SUT VIC AB 2-0 CT1 27 (SUTURE) ×9
SUT VIC AB 2-0 CT1 TAPERPNT 27 (SUTURE) ×3 IMPLANT
SUTURE STRATFX 0 PDS 27 VIOLET (SUTURE) ×1 IMPLANT
TIBIAL BASE ROT PLAT SZ 5 KNEE (Knees) ×3 IMPLANT
TRAY FOLEY MTR SLVR 14FR STAT (SET/KITS/TRAYS/PACK) ×2 IMPLANT
WATER STERILE IRR 1000ML POUR (IV SOLUTION) ×6 IMPLANT
WRAP KNEE MAXI GEL POST OP (GAUZE/BANDAGES/DRESSINGS) ×3 IMPLANT

## 2020-05-24 NOTE — Progress Notes (Signed)
AssistedDr. Charlene Green with right, ultrasound guided, adductor canal block. Side rails up, monitors on throughout procedure. See vital signs in flow sheet. Tolerated Procedure well. ? ?

## 2020-05-24 NOTE — Progress Notes (Signed)
Orthopedic Tech Progress Note Patient Details:  Ashlee Solomon 1950-11-09 [id number]  CPM Right Knee CPM Right Knee: Off Right Knee Flexion (Degrees): 40 Right Knee Extension (Degrees): 10  Post Interventions Patient Tolerated: Well Instructions Provided: Care of device  Ashlee Solomon 05/24/2020, 4:12 PM

## 2020-05-24 NOTE — Transfer of Care (Signed)
Immediate Anesthesia Transfer of Care Note  Patient: JIMMYE WISNIESKI  Procedure(s) Performed: TOTAL KNEE ARTHROPLASTY (Right Knee)  Patient Location: PACU  Anesthesia Type:Spinal  Level of Consciousness: awake, alert , oriented and patient cooperative  Airway & Oxygen Therapy: Patient Spontanous Breathing and Patient connected to face mask oxygen  Post-op Assessment: Report given to RN and Post -op Vital signs reviewed and stable  Post vital signs: Reviewed and stable  Last Vitals:  Vitals Value Taken Time  BP 108/66 05/24/20 1218  Temp 36.4 C 05/24/20 1218  Pulse 63 05/24/20 1221  Resp 17 05/24/20 1221  SpO2 100 % 05/24/20 1221  Vitals shown include unvalidated device data.  Last Pain:  Vitals:   05/24/20 1218  TempSrc:   PainSc: (P) 0-No pain      Patients Stated Pain Goal: 4 (75/91/63 [id number])  Complications: No complications documented.

## 2020-05-24 NOTE — Anesthesia Procedure Notes (Addendum)
Anesthesia Regional Block: Adductor canal block   Pre-Anesthetic Checklist: ,, timeout performed,, Correct Site, Correct Laterality, Correct Procedure, Correct Position, site marked, risks and benefits discussed, Surgical consent,  Pre-op evaluation,  At surgeon's request and post-op pain management  Laterality: Right  Prep: chloraprep       Needles:  Injection technique: Single-shot  Needle Type: Stimulator Needle - 80          Additional Needles:   Procedures: Doppler guided,,,, ultrasound used (permanent image in chart),,,,  Narrative:  Start time: 05/24/2020 10:00 AM End time: 05/24/2020 10:15 AM Injection made incrementally with aspirations every 5 mL. Anesthesiologist: Belinda Block, MD

## 2020-05-24 NOTE — Care Plan (Signed)
Ortho Bundle Case Management Note  Patient Details  Name: Ashlee Solomon MRN: [id number] Date of Birth: 04-Jan-1951  R TKA on 05-24-20 DCP:  Home with husband.  2 story home with 1 ste. MBR on the main floor. DME:  No needs.  Has a RW and elevated toilets. PT:  [facility].  PT eval scheduled on 05-27-20.                   DME Arranged:  N/A DME Agency:  NA  HH Arranged:  NA HH Agency:  NA  Additional Comments: Please contact me with any questions of if this plan should need to change.  Marianne Sofia, RN,CCM [facility]  [phone] 05/24/2020, 9:59 AM

## 2020-05-24 NOTE — Anesthesia Procedure Notes (Signed)
Spinal  Patient location during procedure: OR Start time: 05/24/2020 10:40 AM End time: 05/24/2020 10:55 AM Staffing Performed: anesthesiologist  Anesthesiologist: Belinda Block, MD Preanesthetic Checklist Completed: patient identified, IV checked, site marked, risks and benefits discussed, surgical consent, monitors and equipment checked, pre-op evaluation and timeout performed Spinal Block Patient position: sitting Prep: DuraPrep Patient monitoring: heart rate, cardiac monitor, continuous pulse ox and blood pressure Approach: midline Location: L3-4 Injection technique: single-shot Needle Needle type: Quincke  Needle gauge: 22 G Assessment Sensory level: T12 Additional Notes  Successful attempt by Dr.Phil Michels Clear CSF neg heme Pt tolerated procedure well. Prior attempt by CRNA Lydia Guiles and Dr. Lanetta Inch

## 2020-05-24 NOTE — Interval H&P Note (Signed)
History and Physical Interval Note:  05/24/2020 8:35 AM  Ashlee Solomon  has presented today for surgery, with the diagnosis of right knee osteoarthritis.  The various methods of treatment have been discussed with the patient and family. After consideration of risks, benefits and other options for treatment, the patient has consented to  Procedure(s) with comments: TOTAL KNEE ARTHROPLASTY (Right) - 30min as a surgical intervention.  The patient's history has been reviewed, patient examined, no change in status, stable for surgery.  I have reviewed the patient's chart and labs.  Questions were answered to the patient's satisfaction.     Pilar Plate Marinell Igarashi

## 2020-05-24 NOTE — Discharge Instructions (Addendum)
 Ashlee Aluisio, MD Total Joint Specialist [facility] [place] [address] , [place] [address] [phone]  TOTAL KNEE REPLACEMENT POSTOPERATIVE DIRECTIONS    Knee Rehabilitation, Guidelines Following Surgery  Results after knee surgery are often greatly improved when you follow the exercise, range of motion and muscle strengthening exercises prescribed by your doctor. Safety measures are also important to protect the knee from further injury. If any of these exercises cause you to have increased pain or swelling in your knee joint, decrease the amount until you are comfortable again and slowly increase them. If you have problems or questions, call your caregiver or physical therapist for advice.   HOME CARE INSTRUCTIONS  . Remove items at home which could result in a fall. This includes throw rugs or furniture in walking pathways.  . ICE to the affected knee as much as tolerated. Icing helps control swelling. If the swelling is well controlled you will be more comfortable and rehab easier. Continue to use ice on the knee for pain and swelling from surgery. You may notice swelling that will progress down to the foot and ankle. This is normal after surgery. Elevate the leg when you are not up walking on it.    . Continue to use the breathing machine which will help keep your temperature down. It is common for your temperature to cycle up and down following surgery, especially at night when you are not up moving around and exerting yourself. The breathing machine keeps your lungs expanded and your temperature down. . Do not place pillow under the operative knee, focus on keeping the knee straight while resting  DIET You may resume your previous home diet once you are discharged from the hospital.  DRESSING / WOUND CARE / SHOWERING . Keep your bulky bandage on for 2 days. On the third post-operative day you may remove the Ace bandage and gauze. There is a  waterproof adhesive bandage on your skin which will stay in place until your first follow-up appointment. Once you remove this you will not need to place another bandage . You may begin showering 3 days following surgery, but do not submerge the incision under water.  ACTIVITY For the first 5 days, the key is rest and control of pain and swelling . Do your home exercises twice a day starting on post-operative day 3. On the days you go to physical therapy, just do the home exercises once that day. . You should rest, ice and elevate the leg for 50 minutes out of every hour. Get up and walk/stretch for 10 minutes per hour. After 5 days you can increase your activity slowly as tolerated. . Walk with your walker as instructed. Use the walker until you are comfortable transitioning to a cane. Walk with the cane in the opposite hand of the operative leg. You may discontinue the cane once you are comfortable and walking steadily. . Avoid periods of inactivity such as sitting longer than an hour when not asleep. This helps prevent blood clots.  . You may discontinue the knee immobilizer once you are able to perform a straight leg raise while lying down. . You may resume a sexual relationship in one month or when given the OK by your doctor.  . You may return to work once you are cleared by your doctor.  . Do not drive a car for 6 weeks or until released by your surgeon.  . Do not drive while taking narcotics.  TED HOSE   STOCKINGS Wear the elastic stockings on both legs for three weeks following surgery during the day. You may remove them at night for sleeping.  WEIGHT BEARING Weight bearing as tolerated with assist device (walker, cane, etc) as directed, use it as long as suggested by your surgeon or therapist, typically at least 4-6 weeks.  POSTOPERATIVE CONSTIPATION PROTOCOL Constipation - defined medically as fewer than three stools per week and severe constipation as less than one stool per  week.  One of the most common issues patients have following surgery is constipation.  Even if you have a regular bowel pattern at home, your normal regimen is likely to be disrupted due to multiple reasons following surgery.  Combination of anesthesia, postoperative narcotics, change in appetite and fluid intake all can affect your bowels.  In order to avoid complications following surgery, here are some recommendations in order to help you during your recovery period.  . Colace (docusate) - Pick up an over-the-counter form of Colace or another stool softener and take twice a day as long as you are requiring postoperative pain medications.  Take with a full glass of water daily.  If you experience loose stools or diarrhea, hold the colace until you stool forms back up. If your symptoms do not get better within 1 week or if they get worse, check with your doctor. . Dulcolax (bisacodyl) - Pick up over-the-counter and take as directed by the product packaging as needed to assist with the movement of your bowels.  Take with a full glass of water.  Use this product as needed if not relieved by Colace only.  . MiraLax (polyethylene glycol) - Pick up over-the-counter to have on hand. MiraLax is a solution that will increase the amount of water in your bowels to assist with bowel movements.  Take as directed and can mix with a glass of water, juice, soda, coffee, or tea. Take if you go more than two days without a movement. Do not use MiraLax more than once per day. Call your doctor if you are still constipated or irregular after using this medication for 7 days in a row.  If you continue to have problems with postoperative constipation, please contact the office for further assistance and recommendations.  If you experience "the worst abdominal pain ever" or develop nausea or vomiting, please contact the office immediatly for further recommendations for treatment.  ITCHING If you experience itching with your  medications, try taking only a single pain pill, or even half a pain pill at a time.  You can also use Benadryl over the counter for itching or also to help with sleep.   MEDICATIONS See your medication summary on the "After Visit Summary" that the nursing staff will review with you prior to discharge.  You may have some home medications which will be placed on hold until you complete the course of blood thinner medication.  It is important for you to complete the blood thinner medication as prescribed by your surgeon.  Continue your approved medications as instructed at time of discharge.  PRECAUTIONS . If you experience chest pain or shortness of breath - call 911 immediately for transfer to the hospital emergency department.  . If you develop a fever greater that 101 F, purulent drainage from wound, increased redness or drainage from wound, foul odor from the wound/dressing, or calf pain - CONTACT YOUR SURGEON.                                                     FOLLOW-UP APPOINTMENTS Make sure you keep all of your appointments after your operation with your surgeon and caregivers. You should call the office at the above phone number and make an appointment for approximately two weeks after the date of your surgery or on the date instructed by your surgeon outlined in the "After Visit Summary".  RANGE OF MOTION AND STRENGTHENING EXERCISES  Rehabilitation of the knee is important following a knee injury or an operation. After just a few days of immobilization, the muscles of the thigh which control the knee become weakened and shrink (atrophy). Knee exercises are designed to build up the tone and strength of the thigh muscles and to improve knee motion. Often times heat used for twenty to thirty minutes before working out will loosen up your tissues and help with improving the range of motion but do not use heat for the first two weeks following surgery. These exercises can be done on a training  (exercise) mat, on the floor, on a table or on a bed. Use what ever works the best and is most comfortable for you Knee exercises include:  . Leg Lifts - While your knee is still immobilized in a splint or cast, you can do straight leg raises. Lift the leg to 60 degrees, hold for 3 sec, and slowly lower the leg. Repeat 10-20 times 2-3 times daily. Perform this exercise against resistance later as your knee gets better.  . Quad and Hamstring Sets - Tighten up the muscle on the front of the thigh (Quad) and hold for 5-10 sec. Repeat this 10-20 times hourly. Hamstring sets are done by pushing the foot backward against an object and holding for 5-10 sec. Repeat as with quad sets.   Leg Slides: Lying on your back, slowly slide your foot toward your buttocks, bending your knee up off the floor (only go as far as is comfortable). Then slowly slide your foot back down until your leg is flat on the floor again.  Angel Wings: Lying on your back spread your legs to the side as far apart as you can without causing discomfort.  A rehabilitation program following serious knee injuries can speed recovery and prevent re-injury in the future due to weakened muscles. Contact your doctor or a physical therapist for more information on knee rehabilitation.   IF YOU ARE TRANSFERRED TO A SKILLED REHAB FACILITY If the patient is transferred to a skilled rehab facility following release from the hospital, a list of the current medications will be sent to the facility for the patient to continue.  When discharged from the skilled rehab facility, please have the facility set up the patient's Home Health Physical Therapy prior to being released. Also, the skilled facility will be responsible for providing the patient with their medications at time of release from the facility to include their pain medication, the muscle relaxants, and their blood thinner medication. If the patient is still at the rehab facility at time of the two  week follow up appointment, the skilled rehab facility will also need to assist the patient in arranging follow up appointment in our office and any transportation needs.  MAKE SURE YOU:  . Understand these instructions.  . Get help right away if you are not doing well or get worse.   DENTAL ANTIBIOTICS:  In most cases prophylactic antibiotics for Dental procdeures after total joint surgery are not necessary.  Exceptions are as follows:  1. History of prior total joint infection  2. Severely immunocompromised (  Organ Transplant, cancer chemotherapy, Rheumatoid biologic meds such as Humera)  3. Poorly controlled diabetes (A1C &gt; 8.0, blood glucose over 200)  If you have one of these conditions, contact your surgeon for an antibiotic prescription, prior to your dental procedure.    Pick up stool softner and laxative for home use following surgery while on pain medications. Do not submerge incision under water. Please use good hand washing techniques while changing dressing each day. May shower starting three days after surgery. Please use a clean towel to pat the incision dry following showers. Continue to use ice for pain and swelling after surgery. Do not use any lotions or creams on the incision until instructed by your surgeon.  

## 2020-05-24 NOTE — Evaluation (Signed)
Physical Therapy Evaluation Patient Details Name: Ashlee Solomon MRN: [id number] DOB: [birth year] Today's Date: 05/24/2020   History of Present Illness  s/p R TKA PMH: L TKA, DDD, arthritis  Clinical Impression  Pt is s/p TKA resulting in the deficits listed below (see PT Problem List).  Pt amb ~ 41' with RW and min/guard. Anticipate steady progress in acute setting.   Pt will benefit from skilled PT to increase their independence and safety with mobility to allow discharge to the venue listed below.      Follow Up Recommendations Follow surgeon's recommendation for DC plan and follow-up therapies    Equipment Recommendations  None recommended by PT    Recommendations for Other Services       Precautions / Restrictions Precautions Precautions: Fall;Knee Required Braces or Orthoses: Knee Immobilizer - Right Knee Immobilizer - Right: Discontinue once straight leg raise with < 10 degree lag Restrictions Weight Bearing Restrictions: No Other Position/Activity Restrictions: WBAT      Mobility  Bed Mobility Overal bed mobility: Needs Assistance Bed Mobility: Supine to Sit     Supine to sit: Min guard     General bed mobility comments: for safety  Transfers Overall transfer level: Needs assistance Equipment used: Rolling walker (2 wheeled) Transfers: Sit to/from Stand Sit to Stand: Min guard;Min assist         General transfer comment: cues for hand placement and RLE position  Ambulation/Gait Ambulation/Gait assistance: Min assist;Min guard Gait Distance (Feet): 25 Feet Assistive device: Rolling walker (2 wheeled) Gait Pattern/deviations: Step-to pattern     General Gait Details: cues for sequence and RW position from self  Stairs            Wheelchair Mobility    Modified Rankin (Stroke Patients Only)       Balance                                             Pertinent Vitals/Pain Pain Assessment: 0-10 Pain Score: 2  Pain  Location: right Pain Descriptors / Indicators: Aching;Grimacing;Sore Pain Intervention(s): Limited activity within patient's tolerance;Premedicated before session;Repositioned    Home Living Family/patient expects to be discharged to:: Private residence Living Arrangements: Spouse/significant other Available Help at Discharge: Available PRN/intermittently Type of Home: House Home Access: Stairs to enter Entrance Stairs-Rails: None Entrance Stairs-Number of Steps: 1+1 Home Layout: Two level;Able to live on main level with bedroom/bathroom Home Equipment: Walker - 2 wheels      Prior Function Level of Independence: Independent               Hand Dominance        Extremity/Trunk Assessment   Upper Extremity Assessment Upper Extremity Assessment: Overall WFL for tasks assessed    Lower Extremity Assessment Lower Extremity Assessment: RLE deficits/detail RLE Deficits / Details: ankle WFL (slight numbness likely d/t anesthesia residual);  knee extension and hip flexion 2+/5       Communication   Communication: No difficulties  Cognition Arousal/Alertness: Awake/alert Behavior During Therapy: WFL for tasks assessed/performed Overall Cognitive Status: Within Functional Limits for tasks assessed                                        General Comments      Exercises Total Joint Exercises  Ankle Circles/Pumps: AROM;Both;10 reps   Assessment/Plan    PT Assessment Patient needs continued PT services  PT Problem List Decreased strength;Decreased mobility       PT Treatment Interventions DME instruction;Therapeutic exercise;Gait training;Stair training;Functional mobility training;Therapeutic activities;Patient/family education    PT Goals (Current goals can be found in the Care Plan section)  Acute Rehab PT Goals Patient Stated Goal: home PT Goal Formulation: With patient Time For Goal Achievement: 05/31/20 Potential to Achieve Goals: Good     Frequency 7X/week   Barriers to discharge        Co-evaluation               AM-PAC PT "6 Clicks" Mobility  Outcome Measure Help needed turning from your back to your side while in a flat bed without using bedrails?: A Little Help needed moving from lying on your back to sitting on the side of a flat bed without using bedrails?: A Little Help needed moving to and from a bed to a chair (including a wheelchair)?: A Little Help needed standing up from a chair using your arms (e.g., wheelchair or bedside chair)?: A Little Help needed to walk in hospital room?: A Little Help needed climbing 3-5 steps with a railing? : A Lot 6 Click Score: 17    End of Session Equipment Utilized During Treatment: Gait belt;Right knee immobilizer Activity Tolerance: Patient tolerated treatment well Patient left: with call bell/phone within reach;with family/visitor present;in chair;with chair alarm set (batteries low in alarm, NT notified)   PT Visit Diagnosis: Difficulty in walking, not elsewhere classified (R26.2)    Time: 3094-0768 PT Time Calculation (min) (ACUTE ONLY): 31 min   Charges:   PT Evaluation $PT Eval Low Complexity: 1 Low PT Treatments $Gait Training: 8-22 mins        [name], PT  Acute Rehab Dept ([place]) [phone] Pager [phone]  05/24/2020   [facility] 05/24/2020, 4:49 PM

## 2020-05-24 NOTE — Op Note (Signed)
OPERATIVE REPORT-TOTAL KNEE ARTHROPLASTY   Pre-operative diagnosis- Osteoarthritis  Right knee(s)  Post-operative diagnosis- Osteoarthritis Right knee(s)  Procedure-  Right  Total Knee Arthroplasty  Surgeon- Dione Plover. Zalaya Astarita, MD  Assistant- Molli Barrows, PA-C   Anesthesia-  Adductor canal block and spinal  EBL-75 mL   Drains-None  Tourniquet time-  Total Tourniquet Time Documented: Thigh (Right) - 1 minutes Total: Thigh (Right) - 1 minutes     Complications- None  Condition-PACU - hemodynamically stable.   Brief Clinical Note  Ashlee Solomon is a 69 y.o. year old female with end stage OA of her right knee with progressively worsening pain and dysfunction. She has constant pain, with activity and at rest and significant functional deficits with difficulties even with ADLs. She has had extensive non-op management including analgesics, injections of cortisone and viscosupplements, and home exercise program, but remains in significant pain with significant dysfunction.Radiographs show bone on bone arthritis medial and patellofemoral. She presents now for right Total Knee Arthroplasty.    Procedure in detail---   The patient is brought into the operating room and positioned supine on the operating table. After successful administration of  Adductor canal block and spinal,   a tourniquet is placed high on the  Right thigh(s) and the lower extremity is prepped and draped in the usual sterile fashion. Time out is performed by the operating team and then the  Right lower extremity is wrapped in Esmarch, knee flexed and the tourniquet inflated to 300 mmHg.       A midline incision is made with a ten blade through the subcutaneous tissue to the level of the extensor mechanism. A fresh blade is used to make a medial parapatellar arthrotomy. Soft tissue over the proximal medial tibia is subperiosteally elevated to the joint line with a knife and into the semimembranosus bursa with a Cobb  elevator. Soft tissue over the proximal lateral tibia is elevated with attention being paid to avoiding the patellar tendon on the tibial tubercle. The patella is everted, knee flexed 90 degrees and the ACL and PCL are removed. Findings are bone on bone medial and patellofemoral with massive global osteophytes. The tourniquet was not functioning correctly and was causing venous bleeding thus was released after 1 minute      The drill is used to create a starting hole in the distal femur and the canal is thoroughly irrigated with sterile saline to remove the fatty contents. The 5 degree Right  valgus alignment guide is placed into the femoral canal and the distal femoral cutting block is pinned to remove 9 mm off the distal femur. Resection is made with an oscillating saw.      The tibia is subluxed forward and the menisci are removed. The extramedullary alignment guide is placed referencing proximally at the medial aspect of the tibial tubercle and distally along the second metatarsal axis and tibial crest. The block is pinned to remove 76mm off the more deficient medial  side. Resection is made with an oscillating saw. Size 5is the most appropriate size for the tibia and the proximal tibia is prepared with the modular drill and keel punch for that size.      The femoral sizing guide is placed and size 5 is most appropriate. Rotation is marked off the epicondylar axis and confirmed by creating a rectangular flexion gap at 90 degrees. The size 5 cutting block is pinned in this rotation and the anterior, posterior and chamfer cuts are made with the oscillating  saw. The intercondylar block is then placed and that cut is made.      Trial size 5 tibial component, trial size 5 posterior stabilized femur and a 8  mm posterior stabilized rotating platform insert trial is placed. Full extension is achieved with excellent varus/valgus and anterior/posterior balance throughout full range of motion. The patella is everted  and thickness measured to be 22  mm. Free hand resection is taken to 12 mm, a 35 template is placed, lug holes are drilled, trial patella is placed, and it tracks normally. Osteophytes are removed off the posterior femur with the trial in place. All trials are removed and the cut bone surfaces prepared with pulsatile lavage. Cement is mixed and once ready for implantation, the size 5 tibial implant, size  5 posterior stabilized femoral component, and the size 35 patella are cemented in place and the patella is held with the clamp. The trial insert is placed and the knee held in full extension. The Exparel (20 ml mixed with 60 ml saline) is injected into the extensor mechanism, posterior capsule, medial and lateral gutters and subcutaneous tissues.  All extruded cement is removed and once the cement is hard the permanent 8 mm posterior stabilized rotating platform insert is placed into the tibial tray.      The wound is copiously irrigated with saline solution and the extensor mechanism closed with #1 V-loc suture. Flexion against gravity is 140 degrees and the patella tracks normally. Subcutaneous tissue is closed with 2.0 vicryl and subcuticular with running 4.0 Monocryl. The incision is cleaned and dried and steri-strips and a bulky sterile dressing are applied. The limb is placed into a knee immobilizer and the patient is awakened and transported to recovery in stable condition.      Please note that a surgical assistant was a medical necessity for this procedure in order to perform it in a safe and expeditious manner. Surgical assistant was necessary to retract the ligaments and vital neurovascular structures to prevent injury to them and also necessary for proper positioning of the limb to allow for anatomic placement of the prosthesis.   Dione Plover Mikkel Charrette, MD    05/24/2020, 11:50 AM

## 2020-05-24 NOTE — Anesthesia Postprocedure Evaluation (Signed)
Anesthesia Post Note  Patient: Ashlee Solomon  Procedure(s) Performed: TOTAL KNEE ARTHROPLASTY (Right Knee)     Patient location during evaluation: PACU Anesthesia Type: Spinal Level of consciousness: awake Pain management: pain level controlled Vital Signs Assessment: post-procedure vital signs reviewed and stable Respiratory status: spontaneous breathing Cardiovascular status: stable Postop Assessment: spinal receding Anesthetic complications: no   No complications documented.  Last Vitals:  Vitals:   05/24/20 1531 05/24/20 1636  BP: 106/66 122/79  Pulse: 79 81  Resp: 18 18  Temp: 36.7 C 36.5 C  SpO2: 98% 100%    Last Pain:  Vitals:   05/24/20 1636  TempSrc: Oral  PainSc:                  Malania Gawthrop

## 2020-05-24 NOTE — Progress Notes (Signed)
Orthopedic Tech Progress Note Patient Details:  Ashlee Solomon 1951/09/20 [id number]  CPM Right Knee CPM Right Knee: On Right Knee Flexion (Degrees): 40 Right Knee Extension (Degrees): 10  Post Interventions Patient Tolerated: Well Instructions Provided: Care of device  Ashlee Solomon 05/24/2020, 12:20 PM

## 2020-05-25 ENCOUNTER — Encounter (HOSPITAL_COMMUNITY): Payer: Self-pay | Admitting: Orthopedic Surgery

## 2020-05-25 DIAGNOSIS — M1711 Unilateral primary osteoarthritis, right knee: Secondary | ICD-10-CM | POA: Diagnosis not present

## 2020-05-25 LAB — CBC
HCT: 35 % — ABNORMAL LOW (ref 36.0–46.0)
Hemoglobin: 11.1 g/dL — ABNORMAL LOW (ref 12.0–15.0)
MCH: 30.6 pg (ref 26.0–34.0)
MCHC: 31.7 g/dL (ref 30.0–36.0)
MCV: 96.4 fL (ref 80.0–100.0)
Platelets: 164 10*3/uL (ref 150–400)
RBC: 3.63 MIL/uL — ABNORMAL LOW (ref 3.87–5.11)
RDW: 14.2 % (ref 11.5–15.5)
WBC: 9.2 10*3/uL (ref 4.0–10.5)
nRBC: 0 % (ref 0.0–0.2)

## 2020-05-25 LAB — BASIC METABOLIC PANEL
Anion gap: 6 (ref 5–15)
BUN: 20 mg/dL (ref 8–23)
CO2: 23 mmol/L (ref 22–32)
Calcium: 8.2 mg/dL — ABNORMAL LOW (ref 8.9–10.3)
Chloride: 104 mmol/L (ref 98–111)
Creatinine, Ser: 0.78 mg/dL (ref 0.44–1.00)
GFR calc Af Amer: >60 mL/min (ref >60)
GFR calc non Af Amer: >60 mL/min (ref >60)
Glucose, Bld: 162 mg/dL — ABNORMAL HIGH (ref 70–99)
Potassium: 4.2 mmol/L (ref 3.5–5.1)
Sodium: 133 mmol/L — ABNORMAL LOW (ref 135–145)

## 2020-05-25 MED ORDER — METHOCARBAMOL 500 MG PO TABS: 500.0000 mg | ORAL_TABLET | Freq: Four times a day (QID) | ORAL | 0 refills | Status: DC | PRN

## 2020-05-25 MED ORDER — TRAMADOL HCL 50 MG PO TABS: 50.0000 mg | ORAL_TABLET | Freq: Four times a day (QID) | ORAL | 0 refills | Status: DC | PRN

## 2020-05-25 MED ORDER — OXYCODONE HCL 5 MG PO TABS: 5.0000 mg | ORAL_TABLET | Freq: Four times a day (QID) | ORAL | 0 refills | Status: DC | PRN

## 2020-05-25 MED ORDER — SODIUM CHLORIDE 0.9 % IV BOLUS
250.0000 mL | Freq: Once | INTRAVENOUS | Status: AC
  Administered 2020-05-25: 250 mL via INTRAVENOUS

## 2020-05-25 MED ORDER — GABAPENTIN 300 MG PO CAPS: ORAL_CAPSULE | ORAL | 0 refills | Status: DC

## 2020-05-25 NOTE — Progress Notes (Signed)
Orthopedic Tech Progress Note Patient Details:  Ashlee Solomon 1951-04-27 [id number]  CPM Right Knee CPM Right Knee: On Right Knee Flexion (Degrees): 40 Right Knee Extension (Degrees): 10  Post Interventions Patient Tolerated: Well Instructions Provided: Care of device  [place] Pink 05/25/2020, 1:56 PM

## 2020-05-25 NOTE — Plan of Care (Signed)
  Problem: Education: Goal: Knowledge of the prescribed therapeutic regimen will improve Outcome: Progressing Goal: Individualized Educational Video(s) Outcome: Progressing   Problem: Pain Management: Goal: Pain level will decrease with appropriate interventions Outcome: Progressing

## 2020-05-25 NOTE — Progress Notes (Signed)
Pt is ortho Bundle.  Met briefly with her and confirmed she has no DME needs.  OPPT already set up with [facility] in [place].  No TOC needs.  Evie Croston, LCSW

## 2020-05-25 NOTE — Plan of Care (Signed)
Patient discharged home in stable condition 

## 2020-05-25 NOTE — Progress Notes (Signed)
   Subjective: 1 Day Post-Op Procedure(s) (LRB): TOTAL KNEE ARTHROPLASTY (Right) Patient reports pain as mild.   Patient seen in rounds by Dr. Wynelle Solomon. Patient is well, and has had no acute complaints or problems. States she is ready to go home. Denies chest pain, SOB, or calf pain. No issues overnight. We will continue therapy today.   Objective: Vital signs in last 24 hours: Temp:  [97.5 F (36.4 C)-98.8 F (37.1 C)] 98 F (36.7 C) (08/03 0517) Pulse Rate:  [62-100] 71 (08/03 0517) Resp:  [11-20] 16 (08/03 0517) BP: (100-134)/(59-85) 101/59 (08/03 0517) SpO2:  [96 %-100 %] 100 % (08/03 0517) Weight:  [99.4 kg] 99.4 kg (08/02 0831)  Intake/Output from previous day:  Intake/Output Summary (Last 24 hours) at 05/25/2020 0733 Last data filed at 05/25/2020 5621 Gross per 24 hour  Intake 2473.48 ml  Output 2525 ml  Net -51.52 ml     Intake/Output this shift: No intake/output data recorded.  Labs: Recent Labs    05/25/20 0316  HGB 11.1*   Recent Labs    05/25/20 0316  WBC 9.2  RBC 3.63*  HCT 35.0*  PLT 164   Recent Labs    05/25/20 0316  NA 133*  K 4.2  CL 104  CO2 23  BUN 20  CREATININE 0.78  GLUCOSE 162*  CALCIUM 8.2*   No results for input(s): LABPT, INR in the last 72 hours.  Exam: General - Patient is Alert and Oriented Extremity - Neurologically intact Neurovascular intact Sensation intact distally Dorsiflexion/Plantar flexion intact Dressing - dressing C/D/I Motor Function - intact, moving foot and toes well on exam.   Past Medical History:  Diagnosis Date  . Arthritis   . Asthma   . DDD (degenerative disc disease)   . Dysrhythmia    A fib   . GERD (gastroesophageal reflux disease)   . Hypertension   . Hypothyroidism   . Pneumonia 1/13  . Reactive airway disease     Assessment/Plan: 1 Day Post-Op Procedure(s) (LRB): TOTAL KNEE ARTHROPLASTY (Right) Principal Problem:   OA (osteoarthritis) of knee Active Problems:   Primary  osteoarthritis of right knee  Estimated body mass index is 37.6 kg/m as calculated from the following:   Height as of this encounter: 5\' 4"  (1.626 m).   Weight as of this encounter: 99.4 kg. Advance diet Up with therapy D/C IV fluids   Patient's anticipated LOS is less than 2 midnights, meeting these requirements: - Younger than 66 - Lives within 1 hour of care - Has a competent adult at home to recover with post-op recover - NO history of  - Chronic pain requiring opiods  - Diabetes  - Coronary Artery Disease  - Heart failure  - Heart attack  - Stroke  - DVT/VTE  - Respiratory Failure/COPD  - Renal failure  - Anemia  - Advanced Liver disease  DVT Prophylaxis - Xarelto Weight bearing as tolerated. Continue therapy.  Plan is to go Home after hospital stay. Plan for discharge later today once cleared by PT. Scheduled for [facility] at [facility] in [place] Follow-up in the office August 17th.  Theresa Duty, PA-C Orthopedic Surgery [phone] 05/25/2020, 7:33 AM

## 2020-05-25 NOTE — Progress Notes (Signed)
Physical Therapy Treatment Patient Details Name: Ashlee Solomon MRN: [id number] DOB: October 18, 1951 Today's Date: 05/25/2020    History of Present Illness s/p R TKA PMH: L TKA, DDD, arthritis    PT Comments    Pt doing well this afternoon. Recommend supervision to min/guard for safety with mobility. Pt states she has plenty of family assist.   Follow Up Recommendations  Follow surgeon's recommendation for DC plan and follow-up therapies     Equipment Recommendations  None recommended by PT    Recommendations for Other Services       Precautions / Restrictions Precautions Precautions: Fall;Knee Required Braces or Orthoses: Knee Immobilizer - Right Knee Immobilizer - Right: Discontinue once straight leg raise with < 10 degree lag Restrictions Weight Bearing Restrictions: No Other Position/Activity Restrictions: WBAT    Mobility  Bed Mobility                  Transfers Overall transfer level: Needs assistance Equipment used: Rolling walker (2 wheeled) Transfers: Sit to/from Stand Sit to Stand: Min guard         General transfer comment: cues for hand placement and RLE position  Ambulation/Gait Ambulation/Gait assistance: Min guard Gait Distance (Feet): 40 Feet Assistive device: Rolling walker (2 wheeled) Gait Pattern/deviations: Step-to pattern     General Gait Details: cues for sequence and RW position from self. fatigued after distance above requiringseated rest   Stairs Stairs: Yes Stairs assistance: Min assist Stair Management: No rails;Step to pattern;Forwards;Backwards;With walker Number of Stairs: 1 (x2) General stair comments: cues for sequence and safe technique   Wheelchair Mobility    Modified Rankin (Stroke Patients Only)       Balance                                            Cognition Arousal/Alertness: Awake/alert Behavior During Therapy: WFL for tasks assessed/performed Overall Cognitive Status: Within  Functional Limits for tasks assessed                                        Exercises Total Joint Exercises Ankle Circles/Pumps: AROM;Both;10 reps Quad Sets: AROM;10 reps;Both Heel Slides: AAROM;5 reps;Right Hip ABduction/ADduction: AROM;10 reps;Right Straight Leg Raises: AROM;Both;10 reps    General Comments        Pertinent Vitals/Pain Pain Assessment: 0-10 Pain Score: 4  Pain Location: right knee Pain Descriptors / Indicators: Aching;Grimacing;Sore Pain Intervention(s): Limited activity within patient's tolerance;Monitored during session;Premedicated before session;Repositioned    Home Living                      Prior Function            PT Goals (current goals can now be found in the care plan section) Acute Rehab PT Goals Patient Stated Goal: home PT Goal Formulation: With patient Time For Goal Achievement: 05/31/20 Potential to Achieve Goals: Good    Frequency    7X/week      PT Plan Current plan remains appropriate    Co-evaluation              AM-PAC PT "6 Clicks" Mobility   Outcome Measure  Help needed turning from your back to your side while in a flat bed without using bedrails?: A Little Help needed  moving from lying on your back to sitting on the side of a flat bed without using bedrails?: A Little Help needed moving to and from a bed to a chair (including a wheelchair)?: A Little Help needed standing up from a chair using your arms (e.g., wheelchair or bedside chair)?: A Little Help needed to walk in hospital room?: A Little Help needed climbing 3-5 steps with a railing? : A Lot 6 Click Score: 17    End of Session Equipment Utilized During Treatment: Gait belt;Right knee immobilizer Activity Tolerance: Patient tolerated treatment well Patient left: in chair;with call bell/phone within reach;with chair alarm set Nurse Communication: Mobility status PT Visit Diagnosis: Difficulty in walking, not elsewhere  classified (R26.2)     Time: 3888-2800 PT Time Calculation (min) (ACUTE ONLY): 25 min  Charges:  $Gait Training: 8-22 mins $Therapeutic Exercise: 8-22 mins                     [name], PT  Acute Rehab Dept ([place]) [phone] Pager [phone]  05/25/2020    [facility] 05/25/2020, 2:52 PM

## 2020-05-25 NOTE — Progress Notes (Signed)
Physical Therapy Treatment Patient Details Name: Ashlee Solomon MRN: [id number] DOB: June 23, 1951 Today's Date: 05/25/2020    History of Present Illness s/p R TKA PMH: L TKA, DDD, arthritis    PT Comments    Pt not ready to d/c this am. incr gait distance however fatigued requiring seated rest. Will see again in pm.    Follow Up Recommendations  Follow surgeon's recommendation for DC plan and follow-up therapies     Equipment Recommendations  None recommended by PT    Recommendations for Other Services       Precautions / Restrictions Precautions Precautions: Fall;Knee Required Braces or Orthoses: Knee Immobilizer - Right Knee Immobilizer - Right: Discontinue once straight leg raise with < 10 degree lag Restrictions Weight Bearing Restrictions: No Other Position/Activity Restrictions: WBAT    Mobility  Bed Mobility Overal bed mobility: Needs Assistance Bed Mobility: Supine to Sit     Supine to sit: Min guard     General bed mobility comments: for safety, instructed in useof gait belt as leg lifter   Transfers Overall transfer level: Needs assistance Equipment used: Rolling walker (2 wheeled) Transfers: Sit to/from Stand Sit to Stand: Min assist         General transfer comment: cues for hand placement and RLE position  Ambulation/Gait Ambulation/Gait assistance: Min assist;Min guard Gait Distance (Feet): 55 Feet Assistive device: Rolling walker (2 wheeled) Gait Pattern/deviations: Step-to pattern     General Gait Details: cues for sequence and RW position from self. fatigued after distance above requiringseated rest   Stairs             Wheelchair Mobility    Modified Rankin (Stroke Patients Only)       Balance                                            Cognition Arousal/Alertness: Awake/alert Behavior During Therapy: WFL for tasks assessed/performed Overall Cognitive Status: Within Functional Limits for tasks  assessed                                        Exercises Total Joint Exercises Ankle Circles/Pumps: AROM;Both;10 reps    General Comments        Pertinent Vitals/Pain Pain Assessment: 0-10 Pain Score: 4  Pain Location: right knee Pain Descriptors / Indicators: Aching;Grimacing;Sore Pain Intervention(s): Limited activity within patient's tolerance;Monitored during session;Repositioned    Home Living                      Prior Function            PT Goals (current goals can now be found in the care plan section) Acute Rehab PT Goals Patient Stated Goal: home PT Goal Formulation: With patient Time For Goal Achievement: 05/31/20 Potential to Achieve Goals: Good Progress towards PT goals: Progressing toward goals    Frequency    7X/week      PT Plan Current plan remains appropriate    Co-evaluation              AM-PAC PT "6 Clicks" Mobility   Outcome Measure  Help needed turning from your back to your side while in a flat bed without using bedrails?: A Little Help needed moving from lying on your back  to sitting on the side of a flat bed without using bedrails?: A Little Help needed moving to and from a bed to a chair (including a wheelchair)?: A Little Help needed standing up from a chair using your arms (e.g., wheelchair or bedside chair)?: A Little Help needed to walk in hospital room?: A Little Help needed climbing 3-5 steps with a railing? : A Lot 6 Click Score: 17    End of Session Equipment Utilized During Treatment: Gait belt;Right knee immobilizer Activity Tolerance: Patient tolerated treatment well Patient left: in chair;with call bell/phone within reach Nurse Communication: Mobility status PT Visit Diagnosis: Difficulty in walking, not elsewhere classified (R26.2)     Time: 2060-1561 PT Time Calculation (min) (ACUTE ONLY): 24 min  Charges:  $Gait Training: 23-37 mins                     [name], PT  Acute  Rehab Dept ([place]) [phone] Pager [phone]  05/25/2020    [facility] 05/25/2020, 10:46 AM

## 2020-05-25 NOTE — Progress Notes (Signed)
Orthopedic Tech Progress Note Patient Details:  Ashlee Solomon [birth year] [id number]  CPM Right Knee CPM Right Knee: Off Right Knee Flexion (Degrees): 40 Right Knee Extension (Degrees): 10  Post Interventions Patient Tolerated: Well Instructions Provided: Care of device  Ashlee Solomon 05/25/2020, 2:40 PM

## 2020-05-26 ENCOUNTER — Other Ambulatory Visit: Payer: Self-pay | Admitting: Family Medicine

## 2020-05-26 ENCOUNTER — Other Ambulatory Visit: Payer: Self-pay | Admitting: *Deleted

## 2020-05-26 MED ORDER — FUROSEMIDE 20 MG PO TABS: 20.0000 mg | ORAL_TABLET | Freq: Every day | ORAL | 1 refills | Status: DC

## 2020-05-26 MED ORDER — POTASSIUM CHLORIDE CRYS ER 10 MEQ PO TBCR: 10.0000 meq | EXTENDED_RELEASE_TABLET | Freq: Every day | ORAL | 1 refills | Status: DC

## 2020-05-26 NOTE — Discharge Summary (Signed)
Physician Discharge Summary   Patient ID: Ashlee Solomon MRN: [id number] DOB/AGE: [birth year] 71 y.o.  Admit date: 05/24/2020 Discharge date: 05/25/2020  Primary Diagnosis: Osteoarthritis, right knee   Admission Diagnoses:  Past Medical History:  Diagnosis Date  . Arthritis   . Asthma   . DDD (degenerative disc disease)   . Dysrhythmia    A fib   . GERD (gastroesophageal reflux disease)   . Hypertension   . Hypothyroidism   . Pneumonia 1/13  . Reactive airway disease    Discharge Diagnoses:   Principal Problem:   OA (osteoarthritis) of knee Active Problems:   Primary osteoarthritis of right knee  Estimated body mass index is 37.6 kg/m as calculated from the following:   Height as of this encounter: 5\' 4"  (1.626 m).   Weight as of this encounter: 99.4 kg.  Procedure:  Procedure(s) (LRB): TOTAL KNEE ARTHROPLASTY (Right)   Consults: None  HPI: Ashlee Solomon is a 69 y.o. year old female with end stage OA of her right knee with progressively worsening pain and dysfunction. She has constant pain, with activity and at rest and significant functional deficits with difficulties even with ADLs. She has had extensive non-op management including analgesics, injections of cortisone and viscosupplements, and home exercise program, but remains in significant pain with significant dysfunction.Radiographs show bone on bone arthritis medial and patellofemoral. She presents now for right Total Knee Arthroplasty.    Laboratory Data: Admission on 05/24/2020, Discharged on 05/25/2020  Component Date Value Ref Range Status  . WBC 05/25/2020 9.2  4.0 - 10.5 K/uL Final  . RBC 05/25/2020 3.63* 3.87 - 5.11 MIL/uL Final  . Hemoglobin 05/25/2020 11.1* 12.0 - 15.0 g/dL Final  . HCT 05/25/2020 35.0* 36 - 46 % Final  . MCV 05/25/2020 96.4  80.0 - 100.0 fL Final  . MCH 05/25/2020 30.6  26.0 - 34.0 pg Final  . MCHC 05/25/2020 31.7  30.0 - 36.0 g/dL Final  . RDW 05/25/2020 14.2  11.5 - 15.5 % Final  .  Platelets 05/25/2020 164  150 - 400 K/uL Final  . nRBC 05/25/2020 0.0  0.0 - 0.2 % Final   Performed at [facility], [place] [address] [place], [place] [address]  . Sodium 05/25/2020 133* 135 - 145 mmol/L Final  . Potassium 05/25/2020 4.2  3.5 - 5.1 mmol/L Final  . Chloride 05/25/2020 104  98 - 111 mmol/L Final  . CO2 05/25/2020 23  22 - 32 mmol/L Final  . Glucose, Bld 05/25/2020 162* 70 - 99 mg/dL Final   Glucose reference range applies only to samples taken after fasting for at least 8 hours.  . BUN 05/25/2020 20  8 - 23 mg/dL Final  . Creatinine, Ser 05/25/2020 0.78  0.44 - 1.00 mg/dL Final  . Calcium 05/25/2020 8.2* 8.9 - 10.3 mg/dL Final  . GFR calc non Af Amer 05/25/2020 >60  >60 mL/min Final  . GFR calc Af Amer 05/25/2020 >60  >60 mL/min Final  . Anion gap 05/25/2020 6  5 - 15 Final   Performed at [facility], [place] [address] [place], [place] [address]  Hospital Outpatient Visit on 05/20/2020  Component Date Value Ref Range Status  . SARS Coronavirus 2 05/20/2020 NEGATIVE  NEGATIVE Final   Comment: (NOTE) SARS-CoV-2 target nucleic acids are NOT DETECTED.  The SARS-CoV-2 RNA is generally detectable in upper and lower respiratory specimens during the acute phase of infection. Negative results do not preclude SARS-CoV-2 infection, do  not rule out co-infections with other pathogens, and should not be used as the sole basis for treatment or other patient management decisions. Negative results must be combined with clinical observations, patient history, and epidemiological information. The expected result is Negative.  Fact Sheet for Patients: SugarRoll.be  Fact Sheet for Healthcare Providers: https://www.woods-mathews.com/  This test is not yet approved or cleared by the [place] FDA and  has been authorized for detection and/or diagnosis of SARS-CoV-2 by FDA under an Emergency Use  Authorization (EUA). This EUA will remain  in effect (meaning this test can be used) for the duration of the COVID-19 declaration under Se                          ction 564(b)(1) of the Act, 21 U.S.C. section 360bbb-3(b)(1), unless the authorization is terminated or revoked sooner.  Performed at [facility], [place] [address] [place], [place] [address]   Hospital Outpatient Visit on 05/20/2020  Component Date Value Ref Range Status  . aPTT 05/20/2020 38* 24 - 36 seconds Final   Comment:        IF BASELINE aPTT IS ELEVATED, SUGGEST PATIENT RISK ASSESSMENT BE USED TO DETERMINE APPROPRIATE ANTICOAGULANT THERAPY. Performed at [facility], [place] [address] [place], [place] [address]   . WBC 05/20/2020 7.0  4.0 - 10.5 K/uL Final  . RBC 05/20/2020 4.29  3.87 - 5.11 MIL/uL Final  . Hemoglobin 05/20/2020 12.9  12.0 - 15.0 g/dL Final  . HCT 05/20/2020 40.0  36 - 46 % Final  . MCV 05/20/2020 93.2  80.0 - 100.0 fL Final  . MCH 05/20/2020 30.1  26.0 - 34.0 pg Final  . MCHC 05/20/2020 32.3  30.0 - 36.0 g/dL Final  . RDW 05/20/2020 14.3  11.5 - 15.5 % Final  . Platelets 05/20/2020 232  150 - 400 K/uL Final  . nRBC 05/20/2020 0.0  0.0 - 0.2 % Final   Performed at [facility], [place] [address], [place], [place] [address]  . Sodium 05/20/2020 139  135 - 145 mmol/L Final  . Potassium 05/20/2020 4.3  3.5 - 5.1 mmol/L Final  . Chloride 05/20/2020 103  98 - 111 mmol/L Final  . CO2 05/20/2020 27  22 - 32 mmol/L Final  . Glucose, Bld 05/20/2020 90  70 - 99 mg/dL Final   Glucose reference range applies only to samples taken after fasting for at least 8 hours.  . BUN 05/20/2020 21  8 - 23 mg/dL Final  . Creatinine, Ser 05/20/2020 0.85  0.44 - 1.00 mg/dL Final  . Calcium 05/20/2020 9.8  8.9 - 10.3 mg/dL Final  . Total Protein 05/20/2020 6.9  6.5 - 8.1 g/dL Final  . Albumin 05/20/2020 4.2  3.5 - 5.0 g/dL Final  . AST 05/20/2020 32  15 - 41 U/L Final  . ALT  05/20/2020 18  0 - 44 U/L Final  . Alkaline Phosphatase 05/20/2020 77  38 - 126 U/L Final  . Total Bilirubin 05/20/2020 0.6  0.3 - 1.2 mg/dL Final  . GFR calc non Af Amer 05/20/2020 >60  >60 mL/min Final  . GFR calc Af Amer 05/20/2020 >60  >60 mL/min Final  . Anion gap 05/20/2020 9  5 - 15 Final   Performed at [facility], [place] [address] [place], [place] [address]  . Prothrombin Time 05/20/2020 15.4* 11.4 - 15.2 seconds Final  . INR 05/20/2020 1.3* 0.8 -  1.2 Final   Comment: (NOTE) INR goal varies based on device and disease states. Performed at [facility], [place] [address], [place], [place] [address]   . ABO/RH(D) 05/20/2020 O NEG   Final  . Antibody Screen 05/20/2020 NEG   Final  . Sample Expiration 05/20/2020 05/27/2020,2359   Final  . Extend sample reason 05/20/2020    Final                   Value:NO TRANSFUSIONS OR PREGNANCY IN THE PAST 3 MONTHS Performed at [place] [address] [place], [place] [address]   . MRSA, PCR 05/20/2020 NEGATIVE  NEGATIVE Final  . Staphylococcus aureus 05/20/2020 NEGATIVE  NEGATIVE Final   Comment: (NOTE) The Xpert SA Assay (FDA approved for NASAL specimens in patients 10 years of age and older), is one component of a comprehensive surveillance program. It is not intended to diagnose infection nor to guide or monitor treatment. Performed at [facility], [place] [address], [place], [place] [address]   Office Visit on 04/19/2020  Component Date Value Ref Range Status  . INR 04/19/2020 1.0  0.9 - 1.2 Final   Comment: Reference interval is for non-anticoagulated patients. Suggested INR therapeutic range for Vitamin K antagonist therapy:    Standard Dose (moderate intensity                   therapeutic range):       2.0 - 3.0    Higher intensity therapeutic range       2.5 - 3.5   . Prothrombin Time 04/19/2020 10.9  9.1 - 12.0 sec Final  . Specific Gravity, UA  04/19/2020 1.012  1.005 - 1.030 Final  . pH, UA 04/19/2020 6.5  5.0 - 7.5 Final  . Color, UA 04/19/2020 Yellow  Yellow Final  . Appearance Ur 04/19/2020 Clear  Clear Final  . Leukocytes,UA 04/19/2020 Negative  Negative Final  . Protein,UA 04/19/2020 Negative  Negative/Trace Final  . Glucose, UA 04/19/2020 Negative  Negative Final  . Ketones, UA 04/19/2020 Negative  Negative Final  . RBC, UA 04/19/2020 Negative  Negative Final  . Bilirubin, UA 04/19/2020 Negative  Negative Final  . Urobilinogen, Ur 04/19/2020 0.2  0.2 - 1.0 mg/dL Final  . Nitrite, UA 04/19/2020 Negative  Negative Final     X-Rays:No results found.  EKG: Orders placed or performed in visit on 05/12/20  . EKG     Hospital Course: EURETHA NAJARRO is a 69 y.o. who was admitted to [facility]. They were brought to the operating room on 05/24/2020 and underwent Procedure(s): TOTAL KNEE ARTHROPLASTY.  Patient tolerated the procedure well and was later transferred to the recovery room and then to the orthopaedic floor for postoperative care. They were given PO and IV analgesics for pain control following their surgery. They were given 24 hours of postoperative antibiotics of  Anti-infectives (From admission, onward)   Start     Dose/Rate Route Frequency Ordered Stop   05/24/20 1630  ceFAZolin (ANCEF) IVPB 2g/100 mL premix        2 g 200 mL/hr over 30 Minutes Intravenous Every 6 hours 05/24/20 1330 05/24/20 2254   05/24/20 0800  ceFAZolin (ANCEF) IVPB 2g/100 mL premix        2 g 200 mL/hr over 30 Minutes Intravenous On call to O.R. 05/24/20 0757 05/24/20 1040     and started on DVT prophylaxis in the form of Xarelto.  PT and OT were ordered for total joint protocol. Discharge planning consulted to help with postop disposition and equipment needs.  Patient had a good night on the evening of surgery. They started to get up OOB with therapy on POD #0. Pt was seen during rounds and was ready to go home pending progress with  therapy. She worked with therapy on POD #1 and was meeting her goals. Pt was discharged to home later that day in stable condition.  Diet: Cardiac diet Activity: WBAT Follow-up: in 2 weeks Disposition: Home with outpatient PT at [facility] in [place] Discharged Condition: stable   Discharge Instructions    Call MD / Call 911   Complete by: As directed    If you experience chest pain or shortness of breath, CALL 911 and be transported to the hospital emergency room.  If you develope a fever above 101 F, pus (white drainage) or increased drainage or redness at the wound, or calf pain, call your surgeon's office.   Change dressing   Complete by: As directed    You may remove the bulky bandage (ACE wrap and gauze) two days after surgery. You will have an adhesive waterproof bandage underneath. Leave this in place until your first follow-up appointment.   Constipation Prevention   Complete by: As directed    Drink plenty of fluids.  Prune juice may be helpful.  You may use a stool softener, such as Colace (over the counter) 100 mg twice a day.  Use MiraLax (over the counter) for constipation as needed.   Diet - low sodium heart healthy   Complete by: As directed    Do not put a pillow under the knee. Place it under the heel.   Complete by: As directed    Driving restrictions   Complete by: As directed    No driving for two weeks   TED hose   Complete by: As directed    Use stockings (TED hose) for three weeks on both leg(s).  You may remove them at night for sleeping.   Weight bearing as tolerated   Complete by: As directed      Allergies as of 05/25/2020   No Known Allergies     Medication List    STOP taking these medications   ibuprofen 200 MG tablet Commonly known as: ADVIL     TAKE these medications   acetaminophen 500 MG tablet Commonly known as: TYLENOL Take 1,000 mg by mouth every 6 (six) hours as needed for mild pain or headache.   albuterol 108 (90 Base) MCG/ACT  inhaler Commonly known as: Ventolin HFA TAKE 2 PUFFS BY MOUTH EVERY 4 HOURS AS NEEDED FOR WHEEZE What changed:   how much to take  how to take this  when to take this  reasons to take this  additional instructions   b complex vitamins tablet Take 1 tablet by mouth daily.   CALCIUM-MAGNESIUM-ZINC PO Take 2 tablets by mouth at bedtime.   cetirizine 10 MG tablet Commonly known as: ZYRTEC Take 10 mg by mouth daily as needed for allergies.   Co Q 10 100 MG Caps Take 100 mg by mouth at bedtime.   furosemide 20 MG tablet Commonly known as: LASIX Take 1 tablet (20 mg total) by mouth daily.   gabapentin 300 MG capsule Commonly known as: NEURONTIN Take a 300 mg capsule three times a day for two weeks following surgery.Then take a 300 mg capsule two times a day for two weeks. Then  take a 300 mg capsule once a day for two weeks. Then discontinue.   levothyroxine 175 MCG tablet Commonly known as: SYNTHROID TAKE 1 TABLET BY MOUTH EVERY DAY BEFORE BREAKFAST What changed:   how much to take  how to take this  when to take this  additional instructions   loratadine 10 MG tablet Commonly known as: CLARITIN Take 10 mg by mouth daily as needed for allergies.   Lutein 10 MG Tabs Take 10 mg by mouth daily.   methocarbamol 500 MG tablet Commonly known as: ROBAXIN Take 1 tablet (500 mg total) by mouth every 6 (six) hours as needed for muscle spasms.   metoprolol succinate 50 MG 24 hr tablet Commonly known as: TOPROL-XL TAKE 1 TABLET BY MOUTH DAILY. TAKE WITH OR IMMEDIATELY FOLLOWING A MEAL. What changed: See the new instructions.   oxyCODONE 5 MG immediate release tablet Commonly known as: Oxy IR/ROXICODONE Take 1-2 tablets (5-10 mg total) by mouth every 6 (six) hours as needed for severe pain.   potassium chloride 10 MEQ tablet Commonly known as: Klor-Con M10 Take 1 tablet (10 mEq total) by mouth daily.   traMADol 50 MG tablet Commonly known as: ULTRAM Take 1-2  tablets (50-100 mg total) by mouth every 6 (six) hours as needed for moderate pain.   vitamin C 1000 MG tablet Take 1,000 mg by mouth daily.   Vitamin D-3 125 MCG (5000 UT) Tabs Take 5,000 Units by mouth daily.   Xarelto 15 MG Tabs tablet Generic drug: Rivaroxaban TAKE 1 TABLET (15 MG TOTAL) BY MOUTH DAILY WITH SUPPER. What changed: See the new instructions.            Discharge Care Instructions  (From admission, onward)         Start     Ordered   05/25/20 0000  Weight bearing as tolerated        05/25/20 0737   05/25/20 0000  Change dressing       Comments: You may remove the bulky bandage (ACE wrap and gauze) two days after surgery. You will have an adhesive waterproof bandage underneath. Leave this in place until your first follow-up appointment.   05/25/20 3735          Follow-up Information    Gaynelle Arabian, MD. Go on 06/08/2020.   Specialty: Orthopedic Surgery Why: You are scheduled for a post-operative appointment on 06-08-20 at 2:00 pm.  Contact information: [address] [place] [address] [phone]               Signed: Theresa Duty, PA-C Orthopedic Surgery 05/26/2020, 9:11 AM

## 2020-05-27 DIAGNOSIS — Z96651 Presence of right artificial knee joint: Secondary | ICD-10-CM | POA: Diagnosis not present

## 2020-05-27 DIAGNOSIS — M25561 Pain in right knee: Secondary | ICD-10-CM | POA: Diagnosis not present

## 2020-05-27 DIAGNOSIS — R262 Difficulty in walking, not elsewhere classified: Secondary | ICD-10-CM | POA: Diagnosis not present

## 2020-05-27 DIAGNOSIS — R600 Localized edema: Secondary | ICD-10-CM | POA: Diagnosis not present

## 2020-05-28 ENCOUNTER — Other Ambulatory Visit: Payer: Self-pay | Admitting: Nurse Practitioner

## 2020-05-28 DIAGNOSIS — M25561 Pain in right knee: Secondary | ICD-10-CM | POA: Diagnosis not present

## 2020-05-28 DIAGNOSIS — Z96651 Presence of right artificial knee joint: Secondary | ICD-10-CM | POA: Diagnosis not present

## 2020-05-28 DIAGNOSIS — R262 Difficulty in walking, not elsewhere classified: Secondary | ICD-10-CM | POA: Diagnosis not present

## 2020-05-28 DIAGNOSIS — R600 Localized edema: Secondary | ICD-10-CM | POA: Diagnosis not present

## 2020-05-28 MED ORDER — POTASSIUM CHLORIDE CRYS ER 10 MEQ PO TBCR: 10.0000 meq | EXTENDED_RELEASE_TABLET | Freq: Every day | ORAL | 1 refills | Status: DC

## 2020-05-28 MED ORDER — FUROSEMIDE 20 MG PO TABS: 20.0000 mg | ORAL_TABLET | Freq: Every day | ORAL | 1 refills | Status: DC

## 2020-05-31 DIAGNOSIS — Z96651 Presence of right artificial knee joint: Secondary | ICD-10-CM | POA: Diagnosis not present

## 2020-05-31 DIAGNOSIS — M25561 Pain in right knee: Secondary | ICD-10-CM | POA: Diagnosis not present

## 2020-05-31 DIAGNOSIS — R262 Difficulty in walking, not elsewhere classified: Secondary | ICD-10-CM | POA: Diagnosis not present

## 2020-05-31 DIAGNOSIS — R600 Localized edema: Secondary | ICD-10-CM | POA: Diagnosis not present

## 2020-06-02 DIAGNOSIS — Z96651 Presence of right artificial knee joint: Secondary | ICD-10-CM | POA: Diagnosis not present

## 2020-06-02 DIAGNOSIS — M25561 Pain in right knee: Secondary | ICD-10-CM | POA: Diagnosis not present

## 2020-06-02 DIAGNOSIS — R600 Localized edema: Secondary | ICD-10-CM | POA: Diagnosis not present

## 2020-06-02 DIAGNOSIS — R262 Difficulty in walking, not elsewhere classified: Secondary | ICD-10-CM | POA: Diagnosis not present

## 2020-06-04 DIAGNOSIS — M25561 Pain in right knee: Secondary | ICD-10-CM | POA: Diagnosis not present

## 2020-06-04 DIAGNOSIS — Z96651 Presence of right artificial knee joint: Secondary | ICD-10-CM | POA: Diagnosis not present

## 2020-06-04 DIAGNOSIS — R262 Difficulty in walking, not elsewhere classified: Secondary | ICD-10-CM | POA: Diagnosis not present

## 2020-06-04 DIAGNOSIS — R600 Localized edema: Secondary | ICD-10-CM | POA: Diagnosis not present

## 2020-06-07 DIAGNOSIS — Z96651 Presence of right artificial knee joint: Secondary | ICD-10-CM | POA: Diagnosis not present

## 2020-06-07 DIAGNOSIS — R262 Difficulty in walking, not elsewhere classified: Secondary | ICD-10-CM | POA: Diagnosis not present

## 2020-06-07 DIAGNOSIS — R600 Localized edema: Secondary | ICD-10-CM | POA: Diagnosis not present

## 2020-06-07 DIAGNOSIS — M25561 Pain in right knee: Secondary | ICD-10-CM | POA: Diagnosis not present

## 2020-06-10 DIAGNOSIS — M25561 Pain in right knee: Secondary | ICD-10-CM | POA: Diagnosis not present

## 2020-06-10 DIAGNOSIS — Z96651 Presence of right artificial knee joint: Secondary | ICD-10-CM | POA: Diagnosis not present

## 2020-06-10 DIAGNOSIS — R600 Localized edema: Secondary | ICD-10-CM | POA: Diagnosis not present

## 2020-06-10 DIAGNOSIS — R262 Difficulty in walking, not elsewhere classified: Secondary | ICD-10-CM | POA: Diagnosis not present

## 2020-06-11 DIAGNOSIS — Z96651 Presence of right artificial knee joint: Secondary | ICD-10-CM | POA: Diagnosis not present

## 2020-06-11 DIAGNOSIS — M25561 Pain in right knee: Secondary | ICD-10-CM | POA: Diagnosis not present

## 2020-06-11 DIAGNOSIS — R262 Difficulty in walking, not elsewhere classified: Secondary | ICD-10-CM | POA: Diagnosis not present

## 2020-06-11 DIAGNOSIS — R600 Localized edema: Secondary | ICD-10-CM | POA: Diagnosis not present

## 2020-06-14 DIAGNOSIS — Z96651 Presence of right artificial knee joint: Secondary | ICD-10-CM | POA: Diagnosis not present

## 2020-06-14 DIAGNOSIS — R600 Localized edema: Secondary | ICD-10-CM | POA: Diagnosis not present

## 2020-06-14 DIAGNOSIS — R262 Difficulty in walking, not elsewhere classified: Secondary | ICD-10-CM | POA: Diagnosis not present

## 2020-06-14 DIAGNOSIS — M25561 Pain in right knee: Secondary | ICD-10-CM | POA: Diagnosis not present

## 2020-06-16 DIAGNOSIS — Z96651 Presence of right artificial knee joint: Secondary | ICD-10-CM | POA: Diagnosis not present

## 2020-06-16 DIAGNOSIS — R262 Difficulty in walking, not elsewhere classified: Secondary | ICD-10-CM | POA: Diagnosis not present

## 2020-06-16 DIAGNOSIS — R600 Localized edema: Secondary | ICD-10-CM | POA: Diagnosis not present

## 2020-06-16 DIAGNOSIS — M25561 Pain in right knee: Secondary | ICD-10-CM | POA: Diagnosis not present

## 2020-06-18 DIAGNOSIS — M25561 Pain in right knee: Secondary | ICD-10-CM | POA: Diagnosis not present

## 2020-06-18 DIAGNOSIS — Z96651 Presence of right artificial knee joint: Secondary | ICD-10-CM | POA: Diagnosis not present

## 2020-06-18 DIAGNOSIS — R262 Difficulty in walking, not elsewhere classified: Secondary | ICD-10-CM | POA: Diagnosis not present

## 2020-06-18 DIAGNOSIS — R600 Localized edema: Secondary | ICD-10-CM | POA: Diagnosis not present

## 2020-06-21 DIAGNOSIS — M25561 Pain in right knee: Secondary | ICD-10-CM | POA: Diagnosis not present

## 2020-06-21 DIAGNOSIS — R262 Difficulty in walking, not elsewhere classified: Secondary | ICD-10-CM | POA: Diagnosis not present

## 2020-06-21 DIAGNOSIS — R600 Localized edema: Secondary | ICD-10-CM | POA: Diagnosis not present

## 2020-06-21 DIAGNOSIS — Z96651 Presence of right artificial knee joint: Secondary | ICD-10-CM | POA: Diagnosis not present

## 2020-06-23 DIAGNOSIS — R600 Localized edema: Secondary | ICD-10-CM | POA: Diagnosis not present

## 2020-06-23 DIAGNOSIS — M25561 Pain in right knee: Secondary | ICD-10-CM | POA: Diagnosis not present

## 2020-06-23 DIAGNOSIS — Z96651 Presence of right artificial knee joint: Secondary | ICD-10-CM | POA: Diagnosis not present

## 2020-06-23 DIAGNOSIS — R262 Difficulty in walking, not elsewhere classified: Secondary | ICD-10-CM | POA: Diagnosis not present

## 2020-06-24 DIAGNOSIS — Z96651 Presence of right artificial knee joint: Secondary | ICD-10-CM | POA: Diagnosis not present

## 2020-06-24 DIAGNOSIS — R262 Difficulty in walking, not elsewhere classified: Secondary | ICD-10-CM | POA: Diagnosis not present

## 2020-06-24 DIAGNOSIS — R600 Localized edema: Secondary | ICD-10-CM | POA: Diagnosis not present

## 2020-06-24 DIAGNOSIS — M25561 Pain in right knee: Secondary | ICD-10-CM | POA: Diagnosis not present

## 2020-06-25 ENCOUNTER — Other Ambulatory Visit: Payer: Self-pay | Admitting: Family Medicine

## 2020-06-25 ENCOUNTER — Other Ambulatory Visit: Payer: Self-pay | Admitting: *Deleted

## 2020-06-25 MED ORDER — METOPROLOL SUCCINATE ER 50 MG PO TB24: ORAL_TABLET | ORAL | 0 refills | Status: DC

## 2020-06-25 MED ORDER — LEVOTHYROXINE SODIUM 175 MCG PO TABS: ORAL_TABLET | ORAL | 0 refills | Status: DC

## 2020-06-29 DIAGNOSIS — Z471 Aftercare following joint replacement surgery: Secondary | ICD-10-CM | POA: Diagnosis not present

## 2020-06-29 DIAGNOSIS — Z96651 Presence of right artificial knee joint: Secondary | ICD-10-CM | POA: Diagnosis not present

## 2020-06-30 DIAGNOSIS — Z96651 Presence of right artificial knee joint: Secondary | ICD-10-CM | POA: Diagnosis not present

## 2020-06-30 DIAGNOSIS — R262 Difficulty in walking, not elsewhere classified: Secondary | ICD-10-CM | POA: Diagnosis not present

## 2020-06-30 DIAGNOSIS — M25561 Pain in right knee: Secondary | ICD-10-CM | POA: Diagnosis not present

## 2020-06-30 DIAGNOSIS — R600 Localized edema: Secondary | ICD-10-CM | POA: Diagnosis not present

## 2020-07-02 DIAGNOSIS — M25561 Pain in right knee: Secondary | ICD-10-CM | POA: Diagnosis not present

## 2020-07-02 DIAGNOSIS — R262 Difficulty in walking, not elsewhere classified: Secondary | ICD-10-CM | POA: Diagnosis not present

## 2020-07-02 DIAGNOSIS — Z96651 Presence of right artificial knee joint: Secondary | ICD-10-CM | POA: Diagnosis not present

## 2020-07-02 DIAGNOSIS — R600 Localized edema: Secondary | ICD-10-CM | POA: Diagnosis not present

## 2020-07-05 DIAGNOSIS — R262 Difficulty in walking, not elsewhere classified: Secondary | ICD-10-CM | POA: Diagnosis not present

## 2020-07-05 DIAGNOSIS — Z96651 Presence of right artificial knee joint: Secondary | ICD-10-CM | POA: Diagnosis not present

## 2020-07-05 DIAGNOSIS — M25561 Pain in right knee: Secondary | ICD-10-CM | POA: Diagnosis not present

## 2020-07-05 DIAGNOSIS — R600 Localized edema: Secondary | ICD-10-CM | POA: Diagnosis not present

## 2020-07-07 DIAGNOSIS — M25561 Pain in right knee: Secondary | ICD-10-CM | POA: Diagnosis not present

## 2020-07-07 DIAGNOSIS — R600 Localized edema: Secondary | ICD-10-CM | POA: Diagnosis not present

## 2020-07-07 DIAGNOSIS — R262 Difficulty in walking, not elsewhere classified: Secondary | ICD-10-CM | POA: Diagnosis not present

## 2020-07-07 DIAGNOSIS — Z96651 Presence of right artificial knee joint: Secondary | ICD-10-CM | POA: Diagnosis not present

## 2020-07-13 DIAGNOSIS — R262 Difficulty in walking, not elsewhere classified: Secondary | ICD-10-CM | POA: Diagnosis not present

## 2020-07-13 DIAGNOSIS — R600 Localized edema: Secondary | ICD-10-CM | POA: Diagnosis not present

## 2020-07-13 DIAGNOSIS — M25561 Pain in right knee: Secondary | ICD-10-CM | POA: Diagnosis not present

## 2020-07-13 DIAGNOSIS — Z96651 Presence of right artificial knee joint: Secondary | ICD-10-CM | POA: Diagnosis not present

## 2020-07-16 DIAGNOSIS — R600 Localized edema: Secondary | ICD-10-CM | POA: Diagnosis not present

## 2020-07-16 DIAGNOSIS — R262 Difficulty in walking, not elsewhere classified: Secondary | ICD-10-CM | POA: Diagnosis not present

## 2020-07-16 DIAGNOSIS — Z96651 Presence of right artificial knee joint: Secondary | ICD-10-CM | POA: Diagnosis not present

## 2020-07-16 DIAGNOSIS — M25561 Pain in right knee: Secondary | ICD-10-CM | POA: Diagnosis not present

## 2020-07-20 DIAGNOSIS — R600 Localized edema: Secondary | ICD-10-CM | POA: Diagnosis not present

## 2020-07-20 DIAGNOSIS — Z96651 Presence of right artificial knee joint: Secondary | ICD-10-CM | POA: Diagnosis not present

## 2020-07-20 DIAGNOSIS — R262 Difficulty in walking, not elsewhere classified: Secondary | ICD-10-CM | POA: Diagnosis not present

## 2020-07-20 DIAGNOSIS — M25561 Pain in right knee: Secondary | ICD-10-CM | POA: Diagnosis not present

## 2020-07-23 DIAGNOSIS — M25561 Pain in right knee: Secondary | ICD-10-CM | POA: Diagnosis not present

## 2020-07-23 DIAGNOSIS — Z96651 Presence of right artificial knee joint: Secondary | ICD-10-CM | POA: Diagnosis not present

## 2020-07-23 DIAGNOSIS — R600 Localized edema: Secondary | ICD-10-CM | POA: Diagnosis not present

## 2020-07-23 DIAGNOSIS — R262 Difficulty in walking, not elsewhere classified: Secondary | ICD-10-CM | POA: Diagnosis not present

## 2020-07-26 DIAGNOSIS — M25561 Pain in right knee: Secondary | ICD-10-CM | POA: Diagnosis not present

## 2020-07-26 DIAGNOSIS — R600 Localized edema: Secondary | ICD-10-CM | POA: Diagnosis not present

## 2020-07-26 DIAGNOSIS — R262 Difficulty in walking, not elsewhere classified: Secondary | ICD-10-CM | POA: Diagnosis not present

## 2020-07-26 DIAGNOSIS — Z96651 Presence of right artificial knee joint: Secondary | ICD-10-CM | POA: Diagnosis not present

## 2020-07-30 DIAGNOSIS — Z96651 Presence of right artificial knee joint: Secondary | ICD-10-CM | POA: Diagnosis not present

## 2020-07-30 DIAGNOSIS — M25561 Pain in right knee: Secondary | ICD-10-CM | POA: Diagnosis not present

## 2020-07-30 DIAGNOSIS — R262 Difficulty in walking, not elsewhere classified: Secondary | ICD-10-CM | POA: Diagnosis not present

## 2020-07-30 DIAGNOSIS — R600 Localized edema: Secondary | ICD-10-CM | POA: Diagnosis not present

## 2020-08-02 DIAGNOSIS — R600 Localized edema: Secondary | ICD-10-CM | POA: Diagnosis not present

## 2020-08-02 DIAGNOSIS — M25561 Pain in right knee: Secondary | ICD-10-CM | POA: Diagnosis not present

## 2020-08-02 DIAGNOSIS — R262 Difficulty in walking, not elsewhere classified: Secondary | ICD-10-CM | POA: Diagnosis not present

## 2020-08-02 DIAGNOSIS — Z96651 Presence of right artificial knee joint: Secondary | ICD-10-CM | POA: Diagnosis not present

## 2020-08-03 ENCOUNTER — Telehealth: Payer: Self-pay

## 2020-08-03 ENCOUNTER — Other Ambulatory Visit: Payer: Self-pay | Admitting: *Deleted

## 2020-08-03 DIAGNOSIS — Z79899 Other long term (current) drug therapy: Secondary | ICD-10-CM

## 2020-08-03 DIAGNOSIS — E039 Hypothyroidism, unspecified: Secondary | ICD-10-CM

## 2020-08-03 DIAGNOSIS — Z0000 Encounter for general adult medical examination without abnormal findings: Secondary | ICD-10-CM

## 2020-08-03 NOTE — Telephone Encounter (Signed)
Just recommend recheck her TSH and bmp. Thx, dr. Lovena Le

## 2020-08-03 NOTE — Telephone Encounter (Signed)
Patient had labs done by Dr. Lovena Le in June and hospital labs done in July. Please advise if anything is needed prior to physical.

## 2020-08-03 NOTE — Telephone Encounter (Signed)
Patient has physical on 12/20 and needing labs done

## 2020-08-03 NOTE — Telephone Encounter (Signed)
Labs ordered, patient notified

## 2020-08-12 ENCOUNTER — Other Ambulatory Visit (HOSPITAL_COMMUNITY): Payer: Self-pay | Admitting: Family Medicine

## 2020-08-12 DIAGNOSIS — Z1231 Encounter for screening mammogram for malignant neoplasm of breast: Secondary | ICD-10-CM

## 2020-09-22 DIAGNOSIS — Z23 Encounter for immunization: Secondary | ICD-10-CM | POA: Diagnosis not present

## 2020-09-23 ENCOUNTER — Other Ambulatory Visit: Payer: Self-pay | Admitting: Family Medicine

## 2020-09-23 ENCOUNTER — Other Ambulatory Visit: Payer: Self-pay

## 2020-09-23 MED ORDER — LEVOTHYROXINE SODIUM 175 MCG PO TABS: ORAL_TABLET | ORAL | 0 refills | Status: DC

## 2020-09-27 ENCOUNTER — Ambulatory Visit (HOSPITAL_COMMUNITY): Payer: Medicare Other

## 2020-10-01 DIAGNOSIS — H5203 Hypermetropia, bilateral: Secondary | ICD-10-CM | POA: Diagnosis not present

## 2020-10-01 DIAGNOSIS — H43812 Vitreous degeneration, left eye: Secondary | ICD-10-CM | POA: Diagnosis not present

## 2020-10-01 DIAGNOSIS — H25813 Combined forms of age-related cataract, bilateral: Secondary | ICD-10-CM | POA: Diagnosis not present

## 2020-10-01 DIAGNOSIS — H524 Presbyopia: Secondary | ICD-10-CM | POA: Diagnosis not present

## 2020-10-01 DIAGNOSIS — H52222 Regular astigmatism, left eye: Secondary | ICD-10-CM | POA: Diagnosis not present

## 2020-10-06 ENCOUNTER — Other Ambulatory Visit: Payer: Self-pay

## 2020-10-06 ENCOUNTER — Ambulatory Visit (HOSPITAL_COMMUNITY)
Admission: RE | Admit: 2020-10-06 | Discharge: 2020-10-06 | Disposition: A | Discharge status: Home / Self Care | Payer: Medicare Other | Source: Ambulatory Visit | Attending: Family Medicine | Admitting: Family Medicine

## 2020-10-06 DIAGNOSIS — E039 Hypothyroidism, unspecified: Secondary | ICD-10-CM | POA: Diagnosis not present

## 2020-10-06 DIAGNOSIS — Z1231 Encounter for screening mammogram for malignant neoplasm of breast: Secondary | ICD-10-CM | POA: Insufficient documentation

## 2020-10-06 DIAGNOSIS — Z0000 Encounter for general adult medical examination without abnormal findings: Secondary | ICD-10-CM | POA: Diagnosis not present

## 2020-10-06 DIAGNOSIS — Z79899 Other long term (current) drug therapy: Secondary | ICD-10-CM | POA: Diagnosis not present

## 2020-10-07 LAB — BASIC METABOLIC PANEL
BUN/Creatinine Ratio: 20 (ref 12–28)
BUN: 19 mg/dL (ref 8–27)
CO2: 22 mmol/L (ref 20–29)
Calcium: 10.1 mg/dL (ref 8.7–10.3)
Chloride: 95 mmol/L — ABNORMAL LOW (ref 96–106)
Creatinine, Ser: 0.94 mg/dL (ref 0.57–1.00)
GFR calc Af Amer: 71 mL/min/{1.73_m2} (ref >59)
GFR calc non Af Amer: 61 mL/min/{1.73_m2} (ref >59)
Glucose: 94 mg/dL (ref 65–99)
Potassium: 4.2 mmol/L (ref 3.5–5.2)
Sodium: 140 mmol/L (ref 134–144)

## 2020-10-07 LAB — TSH: TSH: 2.73 u[IU]/mL (ref 0.450–4.500)

## 2020-10-11 ENCOUNTER — Ambulatory Visit (INDEPENDENT_AMBULATORY_CARE_PROVIDER_SITE_OTHER): Payer: Medicare Other | Admitting: Family Medicine

## 2020-10-11 ENCOUNTER — Other Ambulatory Visit: Payer: Self-pay

## 2020-10-11 ENCOUNTER — Encounter: Payer: Self-pay | Admitting: Family Medicine

## 2020-10-11 VITALS — BP 116/80 | HR 94 | Temp 97.6°F | Ht 62.5 in | Wt 212.0 lb

## 2020-10-11 DIAGNOSIS — I4891 Unspecified atrial fibrillation: Secondary | ICD-10-CM | POA: Diagnosis not present

## 2020-10-11 DIAGNOSIS — R609 Edema, unspecified: Secondary | ICD-10-CM

## 2020-10-11 DIAGNOSIS — E876 Hypokalemia: Secondary | ICD-10-CM

## 2020-10-11 DIAGNOSIS — Z0000 Encounter for general adult medical examination without abnormal findings: Secondary | ICD-10-CM | POA: Diagnosis not present

## 2020-10-11 DIAGNOSIS — E039 Hypothyroidism, unspecified: Secondary | ICD-10-CM | POA: Diagnosis not present

## 2020-10-11 MED ORDER — FUROSEMIDE 20 MG PO TABS: 20.0000 mg | ORAL_TABLET | Freq: Every day | ORAL | 1 refills | Status: DC

## 2020-10-11 MED ORDER — POTASSIUM CHLORIDE CRYS ER 10 MEQ PO TBCR: 10.0000 meq | EXTENDED_RELEASE_TABLET | Freq: Every day | ORAL | 1 refills | Status: DC

## 2020-10-11 MED ORDER — METOPROLOL SUCCINATE ER 50 MG PO TB24: ORAL_TABLET | ORAL | 1 refills | Status: DC

## 2020-10-11 MED ORDER — LEVOTHYROXINE SODIUM 175 MCG PO TABS: ORAL_TABLET | ORAL | 1 refills | Status: DC

## 2020-10-11 NOTE — Progress Notes (Signed)
Patient ID: Ashlee Solomon, female    DOB: [birth year], 69 y.o.   MRN: [id number]   Chief Complaint  Patient presents with  . Annual Exam   Subjective:    HPI AWV- Annual Wellness Visit  The patient was seen for their annual wellness visit. The patient's past medical history, surgical history, and family history were reviewed. Pertinent vaccines were reviewed ( tetanus, pneumonia, shingles, flu) The patient's medication list was reviewed and updated.  The height and weight were entered.  BMI recorded in electronic record elsewhere  Cognitive screening was completed. Outcome of Mini - Cog: pass   Falls /depression screening electronically recorded within record elsewhere  Current tobacco usage: none (All patients who use tobacco were given written and verbal information on quitting)  Recent listing of emergency department/hospitalizations over the past year were reviewed.  current specialist the patient sees on a regular basis: cardi[facility] in [place] [facility]   Medicare annual wellness visit patient questionnaire was reviewed.  A written screening schedule for the patient for the next 5-10 years was given. Appropriate discussion of followup regarding next visit was discussed.   Finally remembered that the annual wellness visit does not take the place of regularly scheduled office visits  chronic health problems such as hypertension/diabetes/cholesterol visits.  Sleeping well. Feeling some fatigue in afternoon. Had total knee on rt in august 21.  Doing well. Pt not wanting dexa scan.   Seeing Cardiology- used to see Dr. Bronson Ing, but he retired.  Hasn't found new cardiologist yet.  H/o afib.  Taking metoprolol and xarelto.  No concerns of bleeding.  mammo last week - normal. Large family hsitory of breast cancer.  No history for her of breast cancer.  Gabapentin for the knee replacement, now just on tylenol since she is on xarelto.  Taking 2 tab of 500mg  tylenol  at night.  Asthma controlled. Using albuterol prn.   Medical History Ashlee Solomon has a past medical history of Arthritis, Asthma, DDD (degenerative disc disease), Dysrhythmia, GERD (gastroesophageal reflux disease), Hypertension, Hypothyroidism, Pneumonia (1/13), and Reactive airway disease.   Outpatient Encounter Medications as of 10/11/2020  Medication Sig  . acetaminophen (TYLENOL) 500 MG tablet Take 1,000 mg by mouth every 6 (six) hours as needed for mild pain or headache.  . Ascorbic Acid (VITAMIN C) 1000 MG tablet Take 1,000 mg by mouth daily.  Marland Kitchen b complex vitamins tablet Take 1 tablet by mouth daily.  Marland Kitchen CALCIUM-MAGNESIUM-ZINC PO Take 2 tablets by mouth at bedtime.  . cetirizine (ZYRTEC) 10 MG tablet Take 10 mg by mouth daily as needed for allergies.   . Cholecalciferol (VITAMIN D-3) 125 MCG (5000 UT) TABS Take 5,000 Units by mouth daily.  . Coenzyme Q10 (CO Q 10) 100 MG CAPS Take 100 mg by mouth at bedtime.  . Lutein 10 MG TABS Take 10 mg by mouth daily.  Alveda Reasons 15 MG TABS tablet TAKE 1 TABLET (15 MG TOTAL) BY MOUTH DAILY WITH SUPPER. (Patient taking differently: Take 15 mg by mouth daily with supper.)  . [DISCONTINUED] albuterol (VENTOLIN HFA) 108 (90 Base) MCG/ACT inhaler TAKE 2 PUFFS BY MOUTH EVERY 4 HOURS AS NEEDED FOR WHEEZE (Patient taking differently: Inhale 2 puffs into the lungs every 4 (four) hours as needed for wheezing.)  . [DISCONTINUED] furosemide (LASIX) 20 MG tablet Take 1 tablet (20 mg total) by mouth daily.  . [DISCONTINUED] levothyroxine (SYNTHROID) 175 MCG tablet Take one tablet daily  . [DISCONTINUED] metoprolol succinate (TOPROL-XL) 50 MG 24  hr tablet TAKE ONE TABLET BY MOUTH DAILY WITH OR IMMEDIATELY FOLLOWING A MEAL.  . [DISCONTINUED] potassium chloride (KLOR-CON M10) 10 MEQ tablet Take 1 tablet (10 mEq total) by mouth daily.  . furosemide (LASIX) 20 MG tablet Take 1 tablet (20 mg total) by mouth daily.  Marland Kitchen levothyroxine (SYNTHROID) 175 MCG tablet Take one tablet  daily  . metoprolol succinate (TOPROL-XL) 50 MG 24 hr tablet TAKE ONE TABLET BY MOUTH DAILY WITH OR IMMEDIATELY FOLLOWING A MEAL.  Marland Kitchen potassium chloride (KLOR-CON M10) 10 MEQ tablet Take 1 tablet (10 mEq total) by mouth daily.  . [DISCONTINUED] gabapentin (NEURONTIN) 300 MG capsule Take a 300 mg capsule three times a day for two weeks following surgery.Then take a 300 mg capsule two times a day for two weeks. Then take a 300 mg capsule once a day for two weeks. Then discontinue.  . [DISCONTINUED] loratadine (CLARITIN) 10 MG tablet Take 10 mg by mouth daily as needed for allergies.   . [DISCONTINUED] methocarbamol (ROBAXIN) 500 MG tablet Take 1 tablet (500 mg total) by mouth every 6 (six) hours as needed for muscle spasms.  . [DISCONTINUED] oxyCODONE (OXY IR/ROXICODONE) 5 MG immediate release tablet Take 1-2 tablets (5-10 mg total) by mouth every 6 (six) hours as needed for severe pain.  . [DISCONTINUED] traMADol (ULTRAM) 50 MG tablet Take 1-2 tablets (50-100 mg total) by mouth every 6 (six) hours as needed for moderate pain.   No facility-administered encounter medications on file as of 10/11/2020.     Review of Systems  Constitutional: Negative for chills and fever.  HENT: Negative for congestion, rhinorrhea and sore throat.   Respiratory: Negative for cough, shortness of breath and wheezing.   Cardiovascular: Negative for chest pain and leg swelling.  Gastrointestinal: Negative for abdominal pain, diarrhea, nausea and vomiting.  Genitourinary: Negative for dysuria and frequency.  Musculoskeletal: Negative for arthralgias and back pain.  Skin: Negative for rash.  Neurological: Negative for dizziness, weakness and headaches.     Vitals BP 116/80   Pulse 94   Temp 97.6 F (36.4 C)   Ht 5' 2.5" (1.588 m)   Wt 212 lb (96.2 kg)   SpO2 99%   BMI 38.16 kg/m   Objective:   Physical Exam Vitals and nursing note reviewed.  Constitutional:      General: She is not in acute distress.     Appearance: Normal appearance. She is not ill-appearing.  HENT:     Head: Normocephalic and atraumatic.     Right Ear: Tympanic membrane, ear canal and external ear normal.     Left Ear: Tympanic membrane, ear canal and external ear normal.     Nose: Nose normal. No congestion.     Mouth/Throat:     Mouth: Mucous membranes are moist.     Pharynx: Oropharynx is clear. No oropharyngeal exudate or posterior oropharyngeal erythema.  Eyes:     Extraocular Movements: Extraocular movements intact.     Conjunctiva/sclera: Conjunctivae normal.     Pupils: Pupils are equal, round, and reactive to light.  Cardiovascular:     Rate and Rhythm: Normal rate and regular rhythm.     Pulses: Normal pulses.     Heart sounds: Normal heart sounds.  Pulmonary:     Effort: Pulmonary effort is normal.     Breath sounds: Normal breath sounds. No wheezing, rhonchi or rales.  Abdominal:     General: Abdomen is flat. Bowel sounds are normal. There is no distension.  Palpations: Abdomen is soft. There is no mass.     Tenderness: There is no abdominal tenderness. There is no guarding or rebound.     Hernia: No hernia is present.  Musculoskeletal:        General: Normal range of motion.     Cervical back: Normal range of motion.     Right lower leg: No edema.     Left lower leg: No edema.  Skin:    General: Skin is warm and dry.     Findings: No lesion or rash.  Neurological:     General: No focal deficit present.     Mental Status: She is alert and oriented to person, place, and time.     Cranial Nerves: No cranial nerve deficit.  Psychiatric:        Mood and Affect: Mood normal.        Behavior: Behavior normal.      Assessment and Plan   1. Routine general medical examination at a health care facility  2. Hypothyroidism, unspecified type - levothyroxine (SYNTHROID) 175 MCG tablet; Take one tablet daily  Dispense: 90 tablet; Refill: 1  3. Atrial fibrillation, unspecified type (HCC) -  metoprolol succinate (TOPROL-XL) 50 MG 24 hr tablet; TAKE ONE TABLET BY MOUTH DAILY WITH OR IMMEDIATELY FOLLOWING A MEAL.  Dispense: 90 tablet; Refill: 1  4. Postop Hypokalemia - potassium chloride (KLOR-CON M10) 10 MEQ tablet; Take 1 tablet (10 mEq total) by mouth daily.  Dispense: 90 tablet; Refill: 1  5. Peripheral edema - furosemide (LASIX) 20 MG tablet; Take 1 tablet (20 mg total) by mouth daily.  Dispense: 90 tablet; Refill: 1    Hypothyroid- stable. Cont meds. afib- stable. Cont meds. Hypokalemia- cont meds. Edema- cont lasix.  HM- up to date.  Pt declining dexa scan.  Pt to f/u with [facility] in 1 yr.

## 2020-10-21 ENCOUNTER — Other Ambulatory Visit: Payer: Self-pay

## 2020-10-21 ENCOUNTER — Encounter: Payer: Self-pay | Admitting: Family Medicine

## 2020-10-21 ENCOUNTER — Ambulatory Visit (INDEPENDENT_AMBULATORY_CARE_PROVIDER_SITE_OTHER): Payer: Medicare Other | Admitting: Family Medicine

## 2020-10-21 VITALS — HR 68 | Temp 98.2°F | Resp 16

## 2020-10-21 DIAGNOSIS — B9689 Other specified bacterial agents as the cause of diseases classified elsewhere: Secondary | ICD-10-CM | POA: Diagnosis not present

## 2020-10-21 DIAGNOSIS — J019 Acute sinusitis, unspecified: Secondary | ICD-10-CM

## 2020-10-21 DIAGNOSIS — R059 Cough, unspecified: Secondary | ICD-10-CM

## 2020-10-21 DIAGNOSIS — J4540 Moderate persistent asthma, uncomplicated: Secondary | ICD-10-CM

## 2020-10-21 MED ORDER — GUAIFENESIN-CODEINE 100-10 MG/5ML PO SOLN: 5.0000 mL | Freq: Three times a day (TID) | ORAL | 0 refills | Status: DC | PRN

## 2020-10-21 MED ORDER — ALBUTEROL SULFATE HFA 108 (90 BASE) MCG/ACT IN AERS: 2.0000 | INHALATION_SPRAY | Freq: Four times a day (QID) | RESPIRATORY_TRACT | 0 refills | Status: DC | PRN

## 2020-10-21 MED ORDER — BENZONATATE 100 MG PO CAPS: 100.0000 mg | ORAL_CAPSULE | Freq: Two times a day (BID) | ORAL | 0 refills | Status: DC | PRN

## 2020-10-21 MED ORDER — PREDNISONE 10 MG PO TABS: 10.0000 mg | ORAL_TABLET | Freq: Every day | ORAL | 0 refills | Status: DC

## 2020-10-21 MED ORDER — AMOXICILLIN-POT CLAVULANATE 875-125 MG PO TABS: 1.0000 | ORAL_TABLET | Freq: Two times a day (BID) | ORAL | 0 refills | Status: DC

## 2020-10-21 NOTE — Progress Notes (Signed)
Patient ID: Ashlee Solomon, female    DOB: 10/26/1950, 69 y.o.   MRN: [id number]   No chief complaint on file.  Subjective:  CC: sinus pressure, drainage, wheezing, croupy cough  This is a new problem.  Presents today for an acute visit with a complaint of sinus pressure and drainage, wheezing, croupy cough.  Symptoms started 4 days ago, has a history of asthma.  Took a Covid test last night with a negative result.  Has tried her inhaler for the wheezing.  Denies fever, chills, chest pain, shortness of breath.  Reports she has been taking her albuterol inhaler 2 puffs every 4 hours since Sunday.  Reports that she does have increased sinus pain and pressure, no ear pain no nausea vomiting.   sinus pressure, sinus drainage, wheezing, croupy cough. Started 4 days ago. Had at home covid test last night that was negative. Tried inhaler.    Medical History Ashlee Solomon has a past medical history of Arthritis, Asthma, DDD (degenerative disc disease), Dysrhythmia, GERD (gastroesophageal reflux disease), Hypertension, Hypothyroidism, Pneumonia (1/13), and Reactive airway disease.   Outpatient Encounter Medications as of 10/21/2020  Medication Sig  . acetaminophen (TYLENOL) 500 MG tablet Take 1,000 mg by mouth every 6 (six) hours as needed for mild pain or headache.  . albuterol (VENTOLIN HFA) 108 (90 Base) MCG/ACT inhaler Inhale 2 puffs into the lungs every 6 (six) hours as needed for wheezing or shortness of breath.  Marland Kitchen amoxicillin-clavulanate (AUGMENTIN) 875-125 MG tablet Take 1 tablet by mouth 2 (two) times daily.  . Ascorbic Acid (VITAMIN C) 1000 MG tablet Take 1,000 mg by mouth daily.  Marland Kitchen b complex vitamins tablet Take 1 tablet by mouth daily.  . benzonatate (TESSALON) 100 MG capsule Take 1 capsule (100 mg total) by mouth 2 (two) times daily as needed for cough.  Marland Kitchen CALCIUM-MAGNESIUM-ZINC PO Take 2 tablets by mouth at bedtime.  . cetirizine (ZYRTEC) 10 MG tablet Take 10 mg by mouth daily as needed for  allergies.   . Cholecalciferol (VITAMIN D-3) 125 MCG (5000 UT) TABS Take 5,000 Units by mouth daily.  . Coenzyme Q10 (CO Q 10) 100 MG CAPS Take 100 mg by mouth at bedtime.  . furosemide (LASIX) 20 MG tablet Take 1 tablet (20 mg total) by mouth daily.  Marland Kitchen guaiFENesin-codeine 100-10 MG/5ML syrup Take 5 mLs by mouth 3 (three) times daily as needed for cough.  . levothyroxine (SYNTHROID) 175 MCG tablet Take one tablet daily  . Lutein 10 MG TABS Take 10 mg by mouth daily.  . metoprolol succinate (TOPROL-XL) 50 MG 24 hr tablet TAKE ONE TABLET BY MOUTH DAILY WITH OR IMMEDIATELY FOLLOWING A MEAL.  Marland Kitchen potassium chloride (KLOR-CON M10) 10 MEQ tablet Take 1 tablet (10 mEq total) by mouth daily.  . predniSONE (DELTASONE) 10 MG tablet Take 1 tablet (10 mg total) by mouth daily with breakfast.  . XARELTO 15 MG TABS tablet TAKE 1 TABLET (15 MG TOTAL) BY MOUTH DAILY WITH SUPPER. (Patient taking differently: Take 15 mg by mouth daily with supper.)  . [DISCONTINUED] albuterol (VENTOLIN HFA) 108 (90 Base) MCG/ACT inhaler TAKE 2 PUFFS BY MOUTH EVERY 4 HOURS AS NEEDED FOR WHEEZE (Patient taking differently: Inhale 2 puffs into the lungs every 4 (four) hours as needed for wheezing.)   No facility-administered encounter medications on file as of 10/21/2020.     Review of Systems  Constitutional: Negative for chills and fever.  HENT: Positive for congestion, rhinorrhea, sinus pressure and sinus pain. Negative  for ear pain.   Respiratory: Positive for cough, chest tightness and wheezing.        Croupy at night     Vitals Pulse 68   Temp 98.2 F (36.8 C)   Resp 16   SpO2 100%   Objective:   Physical Exam Vitals reviewed.  Constitutional:      General: She is not in acute distress.    Appearance: Normal appearance.  HENT:     Right Ear: Tympanic membrane normal.     Left Ear: Tympanic membrane normal.     Nose: Rhinorrhea present.     Right Sinus: Maxillary sinus tenderness and frontal sinus tenderness  present.     Left Sinus: Maxillary sinus tenderness and frontal sinus tenderness present.  Cardiovascular:     Rate and Rhythm: Normal rate and regular rhythm.     Heart sounds: Normal heart sounds.  Pulmonary:     Effort: Pulmonary effort is normal.     Breath sounds: Normal breath sounds.  Skin:    General: Skin is warm and dry.  Neurological:     General: No focal deficit present.     Mental Status: She is alert.  Psychiatric:        Behavior: Behavior normal.      Assessment and Plan   1. Moderate persistent asthma, unspecified whether complicated - albuterol (VENTOLIN HFA) 108 (90 Base) MCG/ACT inhaler; Inhale 2 puffs into the lungs every 6 (six) hours as needed for wheezing or shortness of breath.  Dispense: 8 g; Refill: 0 - predniSONE (DELTASONE) 10 MG tablet; Take 1 tablet (10 mg total) by mouth daily with breakfast.  Dispense: 7 tablet; Refill: 0  2. Cough - Novel Coronavirus, NAA ([facility]) - benzonatate (TESSALON) 100 MG capsule; Take 1 capsule (100 mg total) by mouth 2 (two) times daily as needed for cough.  Dispense: 20 capsule; Refill: 0 - guaiFENesin-codeine 100-10 MG/5ML syrup; Take 5 mLs by mouth 3 (three) times daily as needed for cough.  Dispense: 120 mL; Refill: 0  3. Acute bacterial rhinosinusitis - amoxicillin-clavulanate (AUGMENTIN) 875-125 MG tablet; Take 1 tablet by mouth 2 (two) times daily.  Dispense: 20 tablet; Refill: 0   Croupy cough/asthma exacerbation: We will refill her albuterol inhaler, prescribed prednisone 10 mg for 7 days.  Symptom relief with Tessalon Perles and guaifenesin with codeine.   Sinus pain/pressure: We will treat for acute bacterial rhinosinusitis with Augmentin for 10 days.  Recommend supportive therapy while you are recovering:   1) Get lots of rest.  2) Take over the counter pain medication if needed, such as acetaminophen or ibuprofen. Read and follow instructions on the label and make sure not to combine other medications  that may have same ingredients in it. It is important to not take too much of these ingredients.  3) Drink plenty of caffeine-free fluids. (If you have heart or kidney problems, follow the instructions of your specialist regarding amounts).  4) If you are hungry, eat a bland diet, such as the BRAT diet (bananas, rice, applesauce, toast).  5) Let us know if you are not feeling better in a week.   Agrees with plan of care discussed today. Understands warning signs to seek further care: Chest pain, shortness of breath, any significant change in health. Understands to follow-up if symptoms do not improve, or worsen.  Will notify of Covid results once available.    10/21/2020

## 2020-11-12 ENCOUNTER — Telehealth: Payer: Self-pay | Admitting: Family Medicine

## 2020-11-12 ENCOUNTER — Other Ambulatory Visit: Payer: Self-pay | Admitting: Family Medicine

## 2020-11-12 DIAGNOSIS — J4540 Moderate persistent asthma, uncomplicated: Secondary | ICD-10-CM

## 2020-11-12 NOTE — Telephone Encounter (Signed)
Chalmers Guest, NP  Vicente Males, LPN Please call Dorian Pod. I am concerned that she used all of her inhaler in 3 weeks. I am thinking she needs an inhaler with steroid and a long acting beta agonist. I am happy to order one for her today. Let me know.  KD  Pt contacted. Pt states she does not need this refilled. Pt states she is doing well.

## 2020-11-12 NOTE — Telephone Encounter (Signed)
10/21/20 was last visit  

## 2020-11-26 ENCOUNTER — Telehealth: Payer: Self-pay

## 2020-11-26 ENCOUNTER — Telehealth: Payer: Self-pay | Admitting: *Deleted

## 2020-11-26 MED ORDER — RIVAROXABAN 20 MG PO TABS: 20.0000 mg | ORAL_TABLET | Freq: Every day | ORAL | 11 refills | Status: DC

## 2020-11-26 NOTE — Telephone Encounter (Signed)
Pt notified of the change in dose of Xarelto. Pt will discontinue taking 15 mg tablets and start taking Xarelto 20 mg Daily with supper.

## 2020-11-26 NOTE — Telephone Encounter (Signed)
-----   Message from Josue Hector, MD sent at 11/26/2020 12:52 PM EST ----- Carolin Coy , Cathy/Sham Alviar can you call patient and call in correct dosing script   ----- Message ----- From: Ramond Dial, RPH-CPP Sent: 11/26/2020  12:10 PM EST To: Josue Hector, MD, Malen Gauze, RN, #  Someone must have dosed it off of her ideal body weight (which is wrong) and not off of her actual body weight creatine clearance. This should be increased to 20mg  daily. ----- Message ----- From: Josue Hector, MD Sent: 11/26/2020   9:38 AM EST To: Malen Gauze, RN, Marikay Alar Isaah Furry, LPN, #  New patient to me 2/14 Why is her xarelto only 15 mg her GFR is > 60

## 2020-11-26 NOTE — Progress Notes (Signed)
Cardiologist:  Jacinta Shoe new to me   70 y.o. with permanent afib. Rate control with lopressor On renally dosed xarelto 15 mg but GFR has been > 60 TTE done 01/23/18  demonstrated normal left ventricular systolic and diastolic function and normal regional wall motion, LVEF 55 to 60%. There was mild mitral and moderate tricuspid regurgitation. The right atrium was moderately dilated. The left atrium was normal in size.  The patient denies any symptoms of chest pain, palpitations, shortness of breath, lightheadedness, dizziness, orthopnea, PND, and syncope.  She takes Lasix for chronic bilateral leg edema. She has been bothered by sinuses and asthma this winter   Has had COVID vaccine with booster in May 2021   We reviewed her renal function and put her on appropriate dose of xarelto Has some transient fall in GFR when taking NSAI for arthritis   She is retired Therapist, sports takes lasix for LE edema Happily married Has two sisters and one had shoulder replacement Last week the other a knee replacement   Social history: She is married. She has 3 sons and several grandchildren. She is a retired Equities trader. She worked from [id number].  Review of Systems: As per "subjective", otherwise negative.  No Known Allergies  Current Outpatient Medications  Medication Sig Dispense Refill  . acetaminophen (TYLENOL) 500 MG tablet Take 1,000 mg by mouth every 6 (six) hours as needed for mild pain or headache.    . albuterol (VENTOLIN HFA) 108 (90 Base) MCG/ACT inhaler TAKE 2 PUFFS BY MOUTH EVERY 6 HOURS AS NEEDED FOR WHEEZE OR SHORTNESS OF BREATH 18 each 0  . Ascorbic Acid (VITAMIN C) 1000 MG tablet Take 1,000 mg by mouth daily.    Marland Kitchen b complex vitamins tablet Take 1 tablet by mouth daily.    Marland Kitchen CALCIUM-MAGNESIUM-ZINC PO Take 2 tablets by mouth at bedtime.    . cetirizine (ZYRTEC) 10 MG tablet Take 10 mg by mouth daily as needed for allergies.     . Cholecalciferol (VITAMIN D-3) 125 MCG (5000 UT)  TABS Take 5,000 Units by mouth daily.    . Coenzyme Q10 (CO Q 10) 100 MG CAPS Take 100 mg by mouth at bedtime.    . furosemide (LASIX) 20 MG tablet Take 1 tablet (20 mg total) by mouth daily. 90 tablet 1  . levothyroxine (SYNTHROID) 175 MCG tablet Take one tablet daily 90 tablet 1  . Lutein 10 MG TABS Take 10 mg by mouth daily.    . metoprolol succinate (TOPROL-XL) 50 MG 24 hr tablet TAKE ONE TABLET BY MOUTH DAILY WITH OR IMMEDIATELY FOLLOWING A MEAL. 90 tablet 1  . potassium chloride (KLOR-CON M10) 10 MEQ tablet Take 1 tablet (10 mEq total) by mouth daily. 90 tablet 1  . rivaroxaban (XARELTO) 20 MG TABS tablet Take 1 tablet (20 mg total) by mouth daily with supper. 30 tablet 11  . predniSONE (DELTASONE) 10 MG tablet Take 1 tablet (10 mg total) by mouth daily with breakfast. 7 tablet 0   No current facility-administered medications for this visit.    Past Medical History:  Diagnosis Date  . Arthritis   . Asthma   . DDD (degenerative disc disease)   . Dysrhythmia    A fib   . GERD (gastroesophageal reflux disease)   . Hypertension   . Hypothyroidism   . Pneumonia 1/13  . Reactive airway disease     Past Surgical History:  Procedure Laterality Date  . APPENDECTOMY    .  CESAREAN SECTION     x 3  . COLONOSCOPY N/A 04/14/2015   Procedure: COLONOSCOPY;  Surgeon: Rogene Houston, MD;  Location: [facility];  Service: Endoscopy;  Laterality: N/A;  830 - moved to 11:30 - Ann to notify pt  . TONSILLECTOMY    . 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 Marital status: Married    Spouse name: Not on file  . Number of children: Not on file   . Years of education: Not on file  . Highest education level: Not on file  Occupational History  . Not on file  Tobacco Use  . Smoking status: Former Smoker    Packs/day: 0.25    Types: Cigarettes    Quit date: 05/25/2007    Years since quitting: 13.5  . Smokeless tobacco: Never Used  Vaping Use  . Vaping Use: Never used  Substance and Sexual Activity  . Alcohol use: Yes    Comment: mixed drink nightly  . Drug use: No  . Sexual activity: Not Currently  Other Topics Concern  . Not on file  Social History Narrative  . Not on file   Social Determinants of Health   Financial Resource Strain: Not on file  Food Insecurity: Not on file  Transportation Needs: Not on file  Physical Activity: Not on file  Stress: Not on file  Social Connections: Not on file  Intimate Partner Violence: Not on file    Orson Slick LPN was present throughout the entirety of the encounter.  Vitals:   12/06/20 0953  BP: 118/82  Pulse: 89  SpO2: 94%  Weight: 95.3 kg  Height: 5\' 3"  (1.6 m)    Wt Readings from Last 3 Encounters:  12/06/20 95.3 kg  10/11/20 96.2 kg  05/24/20 (!) 99.4 kg     PHYSICAL EXAM  BP 118/82   Pulse 89   Ht 5\' 3"  (1.6 m)   Wt 95.3 kg   SpO2 94%   BMI 37.20 kg/m  Affect appropriate Healthy:  appears stated age 68: normal Neck supple with no adenopathy JVP normal no bruits no thyromegaly Lungs clear with no wheezing and good diaphragmatic motion Heart:  S1/S2 no murmur, no rub, gallop or click PMI normal Abdomen: benighn, BS positve, no tenderness, no AAA no bruit.  No HSM or HJR Distal pulses intact with no bruits No edema Neuro non-focal Skin warm and dry No muscular weakness   Labs: Lab Results  Component Value Date/Time   K 4.2 10/06/2020 09:14 AM   BUN 19 10/06/2020 09:14 AM   CREATININE 0.94 10/06/2020 09:14 AM   CREATININE 0.98 09/30/2014 08:03 AM   ALT 18 05/20/2020 09:40 AM   TSH 2.730 10/06/2020 09:14 AM   HGB 11.1 (L) 05/25/2020  03:16 AM   HGB 13.6 04/08/2020 11:21 AM     Lipids: Lab Results  Component Value Date/Time   LDLCALC 41 04/08/2020 11:21 AM   CHOL 149 04/08/2020 11:21 AM   TRIG 51 04/08/2020 11:21 AM   HDL 97 04/08/2020 11:21 AM       ASSESSMENT AND PLAN:  1. Afib:  Chronic rate control with lopressor  Dr Raliegh Ip indicated xarelto 15 mg adjusted for her renal function  But GFR is is > 60 and we gave her script for appropriate 20 mg daily dosing   2.  Edema: Takes Lasix 20 mg daily. Dependant low sodium diet    Disposition: Follow up 1 yr   Jenkins Rouge

## 2020-11-26 NOTE — Telephone Encounter (Signed)
-----   Message from Peter C Nishan, MD sent at 11/26/2020 12:52 PM EST ----- Thaks , Cathy/Lukisha can you call patient and call in correct dosing script   ----- Message ----- From: Maccia, Melissa D, RPH-CPP Sent: 11/26/2020  12:10 PM EST To: Peter C Nishan, MD, Lisa M Reid, RN, #  Someone must have dosed it off of her ideal body weight (which is wrong) and not off of her actual body weight creatine clearance. This should be increased to 20mg daily. ----- Message ----- From: Nishan, Peter C, MD Sent: 11/26/2020   9:38 AM EST To: Lisa M Reid, RN, Lukisha G Pinnix, LPN, #  New patient to me 2/14 Why is her xarelto only 15 mg her GFR is > 60     

## 2020-11-26 NOTE — Telephone Encounter (Signed)
Left message for patient to return call.Corect dose of Xarelto (20mg ) e-scribed already to her local pharmacy.She should d/c 15 mg dose.

## 2020-11-26 NOTE — Telephone Encounter (Signed)
Called to notify pt of dose change. No answer left msg to call back.

## 2020-12-06 ENCOUNTER — Encounter: Payer: Self-pay | Admitting: Cardiovascular Disease

## 2020-12-06 ENCOUNTER — Ambulatory Visit (INDEPENDENT_AMBULATORY_CARE_PROVIDER_SITE_OTHER): Payer: Medicare Other | Admitting: Cardiovascular Disease

## 2020-12-06 ENCOUNTER — Other Ambulatory Visit: Payer: Self-pay

## 2020-12-06 VITALS — BP 118/82 | HR 89 | Ht 63.0 in | Wt 210.0 lb

## 2020-12-06 DIAGNOSIS — I482 Chronic atrial fibrillation, unspecified: Secondary | ICD-10-CM

## 2020-12-06 NOTE — Patient Instructions (Signed)
Medication Instructions:  Your physician recommends that you continue on your current medications as directed. Please refer to the Current Medication list given to you today.  *If you need a refill on your cardiac medications before your next appointment, please call your pharmacy*   Lab Work: NONE   If you have labs (blood work) drawn today and your tests are completely normal, you will receive your results only by: . MyChart Message (if you have MyChart) OR . A paper copy in the mail If you have any lab test that is abnormal or we need to change your treatment, we will call you to review the results.   Testing/Procedures: NONE    Follow-Up: At [facility], you and your health needs are our priority.  As part of our continuing mission to provide you with exceptional heart care, we have created designated Provider Care Teams.  These Care Teams include your primary Cardiologist (physician) and Advanced Practice Providers (APPs -  Physician Assistants and Nurse Practitioners) who all work together to provide you with the care you need, when you need it.  We recommend signing up for the patient portal called "MyChart".  Sign up information is provided on this After Visit Summary.  MyChart is used to connect with patients for Virtual Visits (Telemedicine).  Patients are able to view lab/test results, encounter notes, upcoming appointments, etc.  Non-urgent messages can be sent to your provider as well.   To learn more about what you can do with MyChart, go to [email or web address].    Your next appointment:   1 year(s)  The format for your next appointment:   In Person  Provider:   Peter Nishan, MD   Other Instructions Thank you for choosing [facility]!    

## 2021-02-21 ENCOUNTER — Other Ambulatory Visit: Payer: Self-pay | Admitting: *Deleted

## 2021-02-21 MED ORDER — RIVAROXABAN 20 MG PO TABS: 20.0000 mg | ORAL_TABLET | Freq: Every day | ORAL | 6 refills | Status: DC

## 2021-02-21 NOTE — Telephone Encounter (Signed)
Prescription refill request for Xarelto received.  Indication: Atrial Fib Last office visit: 12/06/20 Weight: 95.3kg Age: 70 Scr: 0.94 on 10/06/20 CrCl: 82.58  Based on above findings Xarelto 20mg  daily is the appropriate dose.  Refill was approved.  Xarelto 15mg  tablet was requested (previous dose) but the dose was increased to 20mg  daily at last office visit so that dose was sent in.

## 2021-03-04 ENCOUNTER — Telehealth: Payer: Self-pay

## 2021-03-04 DIAGNOSIS — E039 Hypothyroidism, unspecified: Secondary | ICD-10-CM

## 2021-03-04 DIAGNOSIS — Z79899 Other long term (current) drug therapy: Secondary | ICD-10-CM

## 2021-03-04 DIAGNOSIS — Z0000 Encounter for general adult medical examination without abnormal findings: Secondary | ICD-10-CM

## 2021-03-04 NOTE — Telephone Encounter (Signed)
Pt has follow up 06/20 will need blood work   Pt call back [phone]

## 2021-03-04 NOTE — Telephone Encounter (Signed)
Last labs 12/15 tsh and bmp

## 2021-03-07 NOTE — Telephone Encounter (Signed)
Yes pls order, cbc, cmp, lipids, tsh.  Thx Dr. Darene Lamer

## 2021-03-07 NOTE — Telephone Encounter (Signed)
No results found for: HGBA1C  Lab Results  Component Value Date   CREATININE 0.94 10/06/2020     Lab Results  Component Value Date   CHOL 149 04/08/2020   HDL 97 04/08/2020   LDLCALC 41 04/08/2020   TRIG 51 04/08/2020   CHOLHDL 1.5 04/08/2020     BP Readings from Last 3 Encounters:  12/06/20 118/82  10/11/20 116/80  05/25/20 113/74

## 2021-03-07 NOTE — Telephone Encounter (Signed)
Lab orders placed. Tried to contact patient but "call could not be completed"

## 2021-03-08 NOTE — Telephone Encounter (Signed)
Pt.notified

## 2021-04-08 DIAGNOSIS — Z79899 Other long term (current) drug therapy: Secondary | ICD-10-CM | POA: Diagnosis not present

## 2021-04-08 DIAGNOSIS — Z0000 Encounter for general adult medical examination without abnormal findings: Secondary | ICD-10-CM | POA: Diagnosis not present

## 2021-04-08 DIAGNOSIS — E039 Hypothyroidism, unspecified: Secondary | ICD-10-CM | POA: Diagnosis not present

## 2021-04-09 LAB — CBC WITH DIFFERENTIAL/PLATELET
Basophils Absolute: 0 10*3/uL (ref 0.0–0.2)
Basos: 1 %
EOS (ABSOLUTE): 0.1 10*3/uL (ref 0.0–0.4)
Eos: 2 %
Hematocrit: 39.5 % (ref 34.0–46.6)
Hemoglobin: 13.6 g/dL (ref 11.1–15.9)
Immature Grans (Abs): 0 10*3/uL (ref 0.0–0.1)
Immature Granulocytes: 0 %
Lymphocytes Absolute: 2.5 10*3/uL (ref 0.7–3.1)
Lymphs: 39 %
MCH: 30.4 pg (ref 26.6–33.0)
MCHC: 34.4 g/dL (ref 31.5–35.7)
MCV: 88 fL (ref 79–97)
Monocytes Absolute: 0.6 10*3/uL (ref 0.1–0.9)
Monocytes: 9 %
Neutrophils Absolute: 3.2 10*3/uL (ref 1.4–7.0)
Neutrophils: 49 %
Platelets: 235 10*3/uL (ref 150–450)
RBC: 4.48 x10E6/uL (ref 3.77–5.28)
RDW: 13.1 % (ref 11.7–15.4)
WBC: 6.4 10*3/uL (ref 3.4–10.8)

## 2021-04-09 LAB — COMPREHENSIVE METABOLIC PANEL
ALT: 21 IU/L (ref 0–32)
AST: 45 IU/L — ABNORMAL HIGH (ref 0–40)
Albumin/Globulin Ratio: 2.4 — ABNORMAL HIGH (ref 1.2–2.2)
Albumin: 4.7 g/dL (ref 3.7–4.7)
Alkaline Phosphatase: 111 IU/L (ref 44–121)
BUN/Creatinine Ratio: 23 (ref 12–28)
BUN: 21 mg/dL (ref 8–27)
Bilirubin Total: 0.7 mg/dL (ref 0.0–1.2)
CO2: 24 mmol/L (ref 20–29)
Calcium: 10 mg/dL (ref 8.7–10.3)
Chloride: 99 mmol/L (ref 96–106)
Creatinine, Ser: 0.91 mg/dL (ref 0.57–1.00)
Globulin, Total: 2 g/dL (ref 1.5–4.5)
Glucose: 102 mg/dL — ABNORMAL HIGH (ref 65–99)
Potassium: 4.4 mmol/L (ref 3.5–5.2)
Sodium: 138 mmol/L (ref 134–144)
Total Protein: 6.7 g/dL (ref 6.0–8.5)
eGFR: 67 mL/min/{1.73_m2} (ref >59)

## 2021-04-09 LAB — LIPID PANEL
Chol/HDL Ratio: 1.6 ratio (ref 0.0–4.4)
Cholesterol, Total: 163 mg/dL (ref 100–199)
HDL: 102 mg/dL (ref >39)
LDL Chol Calc (NIH): 51 mg/dL (ref 0–99)
Triglycerides: 45 mg/dL (ref 0–149)
VLDL Cholesterol Cal: 10 mg/dL (ref 5–40)

## 2021-04-09 LAB — TSH: TSH: 1.42 u[IU]/mL (ref 0.450–4.500)

## 2021-04-11 ENCOUNTER — Other Ambulatory Visit: Payer: Self-pay

## 2021-04-11 ENCOUNTER — Ambulatory Visit (INDEPENDENT_AMBULATORY_CARE_PROVIDER_SITE_OTHER): Payer: Medicare Other | Admitting: Family Medicine

## 2021-04-11 VITALS — BP 104/65 | HR 92 | Temp 97.4°F | Ht 63.0 in | Wt 211.4 lb

## 2021-04-11 DIAGNOSIS — E039 Hypothyroidism, unspecified: Secondary | ICD-10-CM | POA: Diagnosis not present

## 2021-04-11 DIAGNOSIS — R7301 Impaired fasting glucose: Secondary | ICD-10-CM

## 2021-04-11 DIAGNOSIS — R7401 Elevation of levels of liver transaminase levels: Secondary | ICD-10-CM

## 2021-04-11 MED ORDER — SHINGRIX 50 MCG/0.5ML IM SUSR: 0.5000 mL | Freq: Once | INTRAMUSCULAR | 1 refills | Status: AC

## 2021-04-11 NOTE — Progress Notes (Addendum)
Patient ID: Ashlee Solomon, female    DOB: Dec 05, 1950, 70 y.o.   MRN: [id number]   Chief Complaint  Patient presents with   Hypothyroidism    Follow up   Subjective:    HPI F/u hypothyroidism-  Stable, no new concerns.  Hypothyroidism- Not having any side effects from thyroid meds.  Compliant with meds. No diarrhea, constipation, depression/anxiety, palpitations, excessive hair loss or weight gain/loss. No heat/cold intolerance.  Pt stating was on vacation and eating out and had slight increase on AST.  Pt hasn't had covid since pandemic.  Has h/o asthma and taking vitamins.   Wanting to get the 4th covid vaccine booster.   Medical History Sayler has a past medical history of Arthritis, Asthma, DDD (degenerative disc disease), Dysrhythmia, GERD (gastroesophageal reflux disease), Hypertension, Hypothyroidism, Pneumonia (1/13), and Reactive airway disease.   Outpatient Encounter Medications as of 04/11/2021  Medication Sig   [EXPIRED] Zoster Vaccine Adjuvanted [facility]) injection Inject 0.5 mLs into the muscle once for 1 dose.   acetaminophen (TYLENOL) 500 MG tablet Take 1,000 mg by mouth every 6 (six) hours as needed for mild pain or headache.   albuterol (VENTOLIN HFA) 108 (90 Base) MCG/ACT inhaler TAKE 2 PUFFS BY MOUTH EVERY 6 HOURS AS NEEDED FOR WHEEZE OR SHORTNESS OF BREATH   Ascorbic Acid (VITAMIN C) 1000 MG tablet Take 1,000 mg by mouth daily.   b complex vitamins tablet Take 1 tablet by mouth daily.   CALCIUM-MAGNESIUM-ZINC PO Take 2 tablets by mouth at bedtime.   cetirizine (ZYRTEC) 10 MG tablet Take 10 mg by mouth daily as needed for allergies.    Cholecalciferol (VITAMIN D-3) 125 MCG (5000 UT) TABS Take 5,000 Units by mouth daily.   Coenzyme Q10 (CO Q 10) 100 MG CAPS Take 100 mg by mouth at bedtime.   furosemide (LASIX) 20 MG tablet Take 1 tablet (20 mg total) by mouth daily.   levothyroxine (SYNTHROID) 175 MCG tablet Take one tablet daily   Lutein 10 MG TABS Take  10 mg by mouth daily.   metoprolol succinate (TOPROL-XL) 50 MG 24 hr tablet TAKE ONE TABLET BY MOUTH DAILY WITH OR IMMEDIATELY FOLLOWING A MEAL.   potassium chloride (KLOR-CON M10) 10 MEQ tablet Take 1 tablet (10 mEq total) by mouth daily.   rivaroxaban (XARELTO) 20 MG TABS tablet Take 1 tablet (20 mg total) by mouth daily with supper.   [DISCONTINUED] predniSONE (DELTASONE) 10 MG tablet Take 1 tablet (10 mg total) by mouth daily with breakfast.   No facility-administered encounter medications on file as of 04/11/2021.     Review of Systems  Constitutional:  Negative for chills and fever.  HENT:  Negative for congestion, rhinorrhea and sore throat.   Respiratory:  Negative for cough, shortness of breath and wheezing.   Cardiovascular:  Negative for chest pain and leg swelling.  Gastrointestinal:  Negative for abdominal pain, diarrhea, nausea and vomiting.  Genitourinary:  Negative for dysuria and frequency.  Musculoskeletal:  Negative for arthralgias and back pain.  Skin:  Negative for rash.  Neurological:  Negative for dizziness, weakness and headaches.    Vitals BP 104/65   Pulse 92   Temp (!) 97.4 F (36.3 C) (Oral)   Ht 5\' 3"  (1.6 m)   Wt 211 lb 6.4 oz (95.9 kg)   SpO2 98%   BMI 37.45 kg/m   Objective:   Physical Exam Vitals and nursing note reviewed.  Constitutional:      Appearance: Normal appearance.  HENT:  Head: Normocephalic and atraumatic.     Nose: Nose normal.     Mouth/Throat:     Mouth: Mucous membranes are moist.     Pharynx: Oropharynx is clear.  Eyes:     Extraocular Movements: Extraocular movements intact.     Conjunctiva/sclera: Conjunctivae normal.     Pupils: Pupils are equal, round, and reactive to light.  Cardiovascular:     Rate and Rhythm: Normal rate and regular rhythm.     Pulses: Normal pulses.     Heart sounds: Normal heart sounds.  Pulmonary:     Effort: Pulmonary effort is normal.     Breath sounds: Normal breath sounds. No  wheezing, rhonchi or rales.  Musculoskeletal:        General: Normal range of motion.     Right lower leg: No edema.     Left lower leg: No edema.  Skin:    General: Skin is warm and dry.     Findings: No lesion or rash.  Neurological:     General: No focal deficit present.     Mental Status: She is alert and oriented to person, place, and time.  Psychiatric:        Mood and Affect: Mood normal.        Behavior: Behavior normal.     Assessment and Plan   1. Hypothyroidism, unspecified type  2. Transaminitis  3. Impaired fasting blood sugar   Thyroid- tsh- normal.  Cont meds.  Labs reviewed- stable.  Transaminitis- slight inc in AST.  Will cont to monitor and recheck next visit.  Advising to decrease alcohol intake.  Impaired fasting bg- slight inc at 102.  Dec carbs and inc in exercising.  Return in about 6 months (around 10/11/2021) for f/u hypothyroid, hld, afib.

## 2021-04-25 DIAGNOSIS — R7401 Elevation of levels of liver transaminase levels: Secondary | ICD-10-CM | POA: Insufficient documentation

## 2021-04-25 DIAGNOSIS — R7301 Impaired fasting glucose: Secondary | ICD-10-CM | POA: Insufficient documentation

## 2021-05-01 IMAGING — MG DIGITAL SCREENING BILAT W/ TOMO W/ CAD
8 series · 8 of 24 positions shown · non-contrast
Comparison: Previous exam(s).

ACR Breast Density Category a: The breast tissue is almost entirely
fatty.

CLINICAL DATA: Screening.

EXAM:
DIGITAL SCREENING BILATERAL MAMMOGRAM WITH TOMO AND CAD

[L MLO synth-2D]
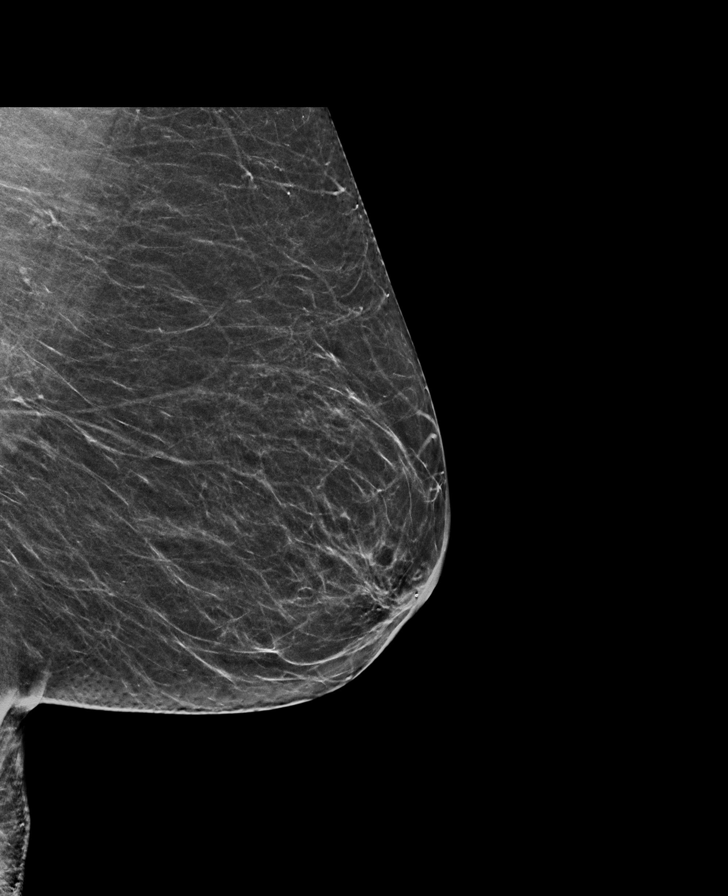

[R CC synth-2D]
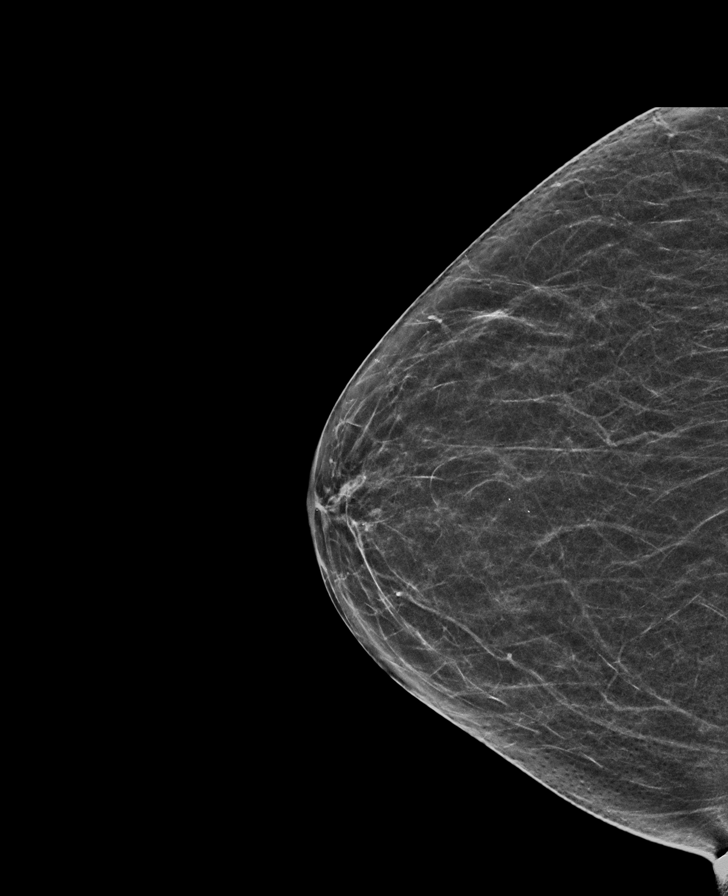

[L CC synth-2D]
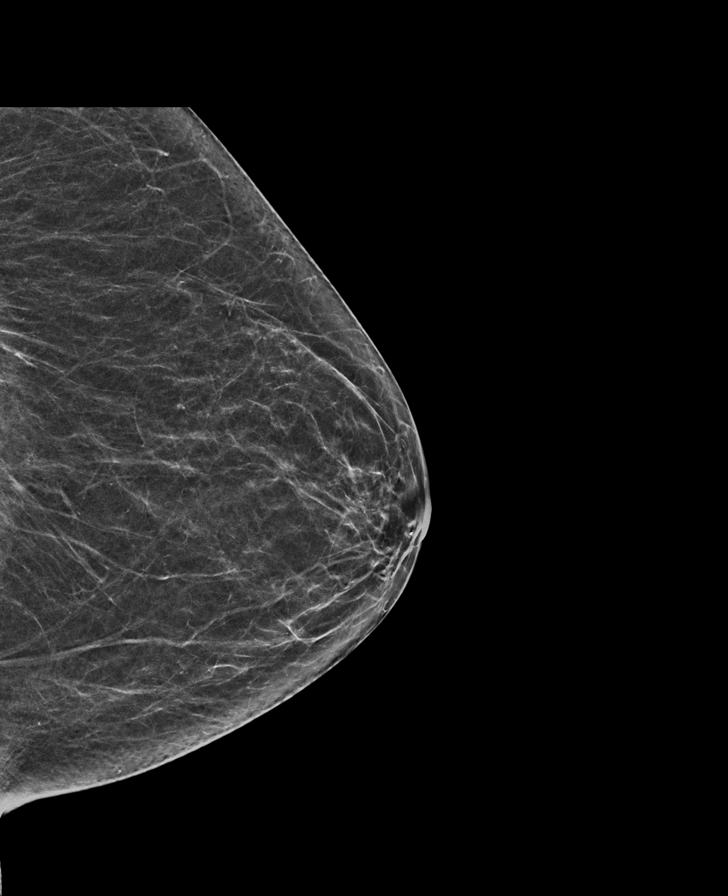

[R MLO synth-2D]
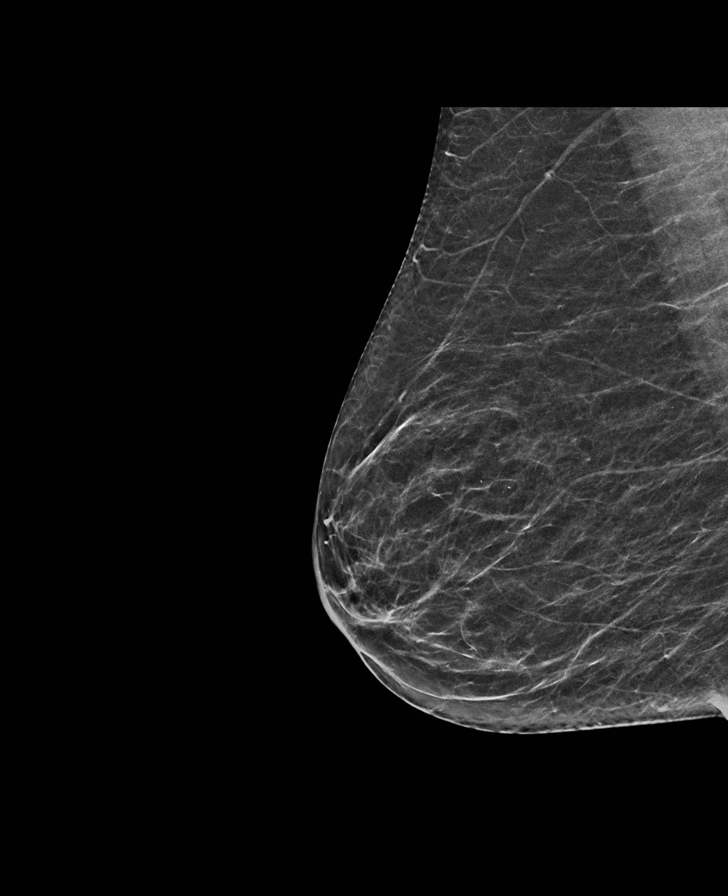

[R MLO tomo · tomo slice 30/59.0]
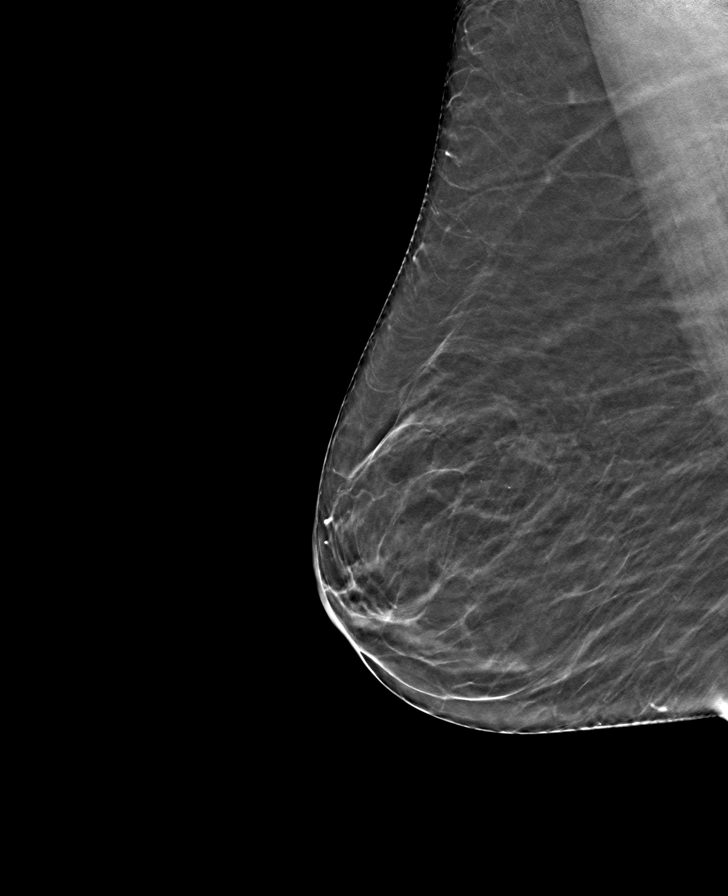

[L CC tomo · tomo slice 29/58.0]
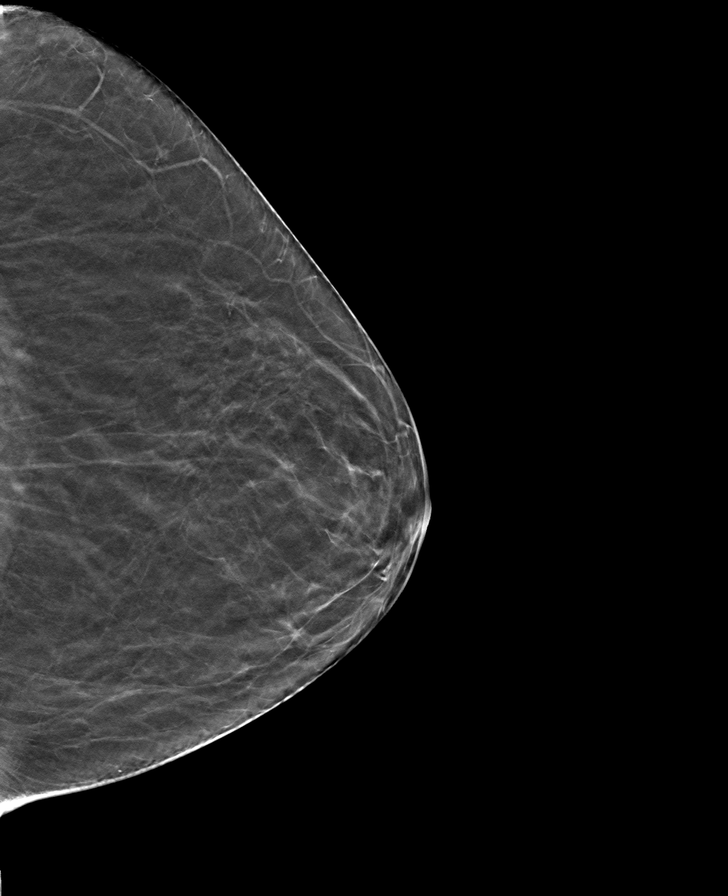

[L MLO tomo · tomo slice 30/59.0]
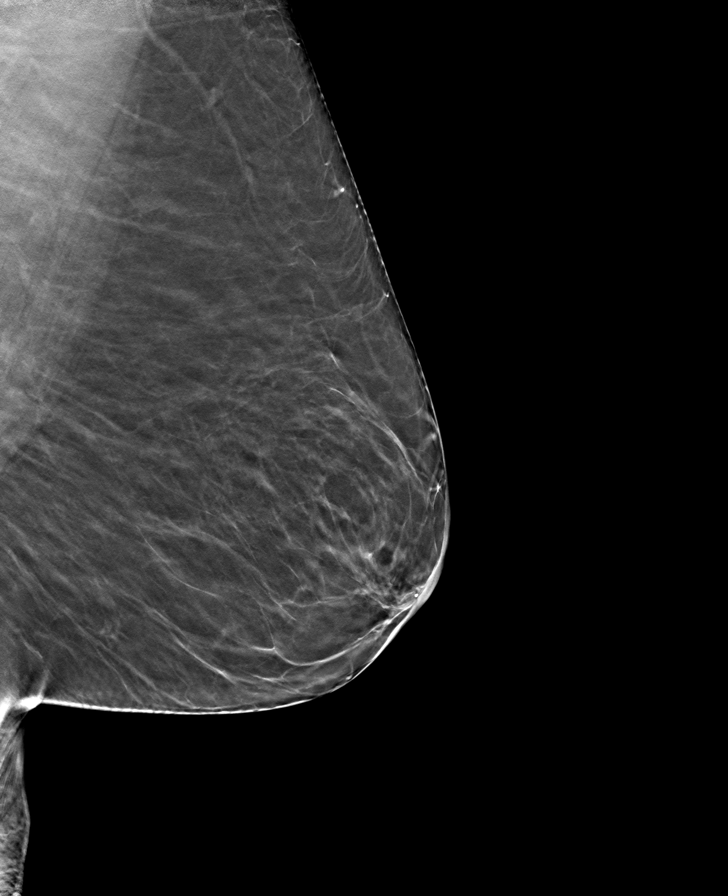

[R CC tomo · tomo slice 27/54.0]
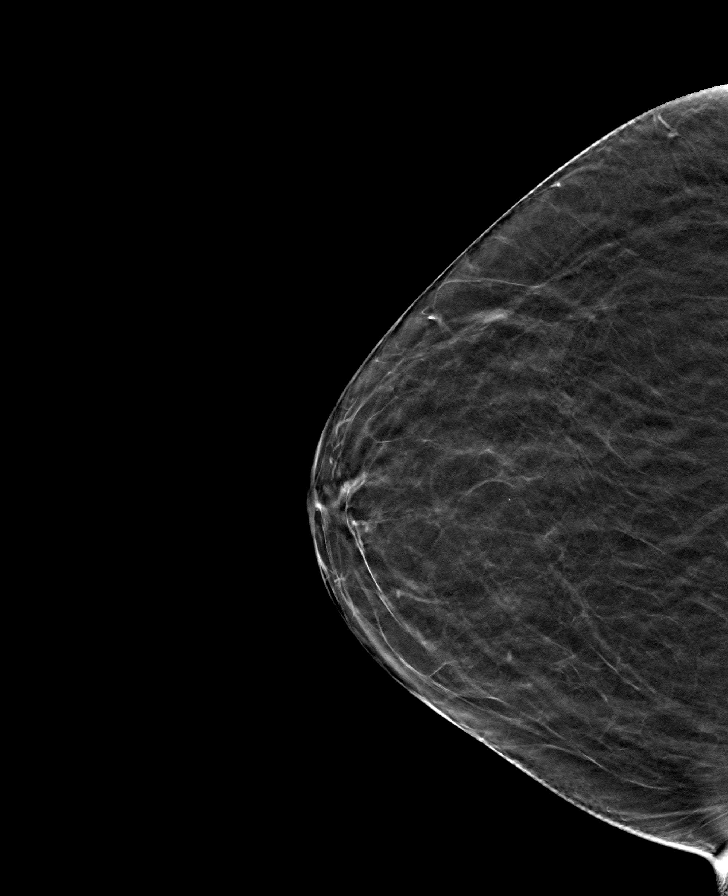

[8 of 24 positions shown; findings below may reference images not displayed]

FINDINGS: There are no findings suspicious for malignancy. Images were
processed with CAD.
IMPRESSION: No mammographic evidence of malignancy. A result letter of this
screening mammogram will be mailed directly to the patient.

RECOMMENDATION:
Screening mammogram in one year. (Code:8Y-Q-VVS)

BI-RADS CATEGORY  1: Negative.

## 2021-05-02 ENCOUNTER — Other Ambulatory Visit: Payer: Self-pay | Admitting: *Deleted

## 2021-05-04 ENCOUNTER — Other Ambulatory Visit: Payer: Self-pay | Admitting: Family Medicine

## 2021-05-04 DIAGNOSIS — E876 Hypokalemia: Secondary | ICD-10-CM

## 2021-06-02 DIAGNOSIS — J029 Acute pharyngitis, unspecified: Secondary | ICD-10-CM | POA: Diagnosis not present

## 2021-06-02 DIAGNOSIS — J019 Acute sinusitis, unspecified: Secondary | ICD-10-CM | POA: Diagnosis not present

## 2021-06-02 DIAGNOSIS — R051 Acute cough: Secondary | ICD-10-CM | POA: Diagnosis not present

## 2021-06-02 DIAGNOSIS — R070 Pain in throat: Secondary | ICD-10-CM | POA: Diagnosis not present

## 2021-06-09 DIAGNOSIS — Z96651 Presence of right artificial knee joint: Secondary | ICD-10-CM | POA: Diagnosis not present

## 2021-06-13 ENCOUNTER — Other Ambulatory Visit: Payer: Self-pay | Admitting: Family Medicine

## 2021-06-13 DIAGNOSIS — I4891 Unspecified atrial fibrillation: Secondary | ICD-10-CM

## 2021-06-19 DIAGNOSIS — J069 Acute upper respiratory infection, unspecified: Secondary | ICD-10-CM | POA: Diagnosis not present

## 2021-06-19 DIAGNOSIS — J019 Acute sinusitis, unspecified: Secondary | ICD-10-CM | POA: Diagnosis not present

## 2021-06-19 DIAGNOSIS — J209 Acute bronchitis, unspecified: Secondary | ICD-10-CM | POA: Diagnosis not present

## 2021-06-19 DIAGNOSIS — Z20822 Contact with and (suspected) exposure to covid-19: Secondary | ICD-10-CM | POA: Diagnosis not present

## 2021-07-04 ENCOUNTER — Other Ambulatory Visit: Payer: Self-pay | Admitting: Family Medicine

## 2021-07-04 DIAGNOSIS — I4891 Unspecified atrial fibrillation: Secondary | ICD-10-CM

## 2021-07-04 DIAGNOSIS — R609 Edema, unspecified: Secondary | ICD-10-CM

## 2021-07-04 DIAGNOSIS — E876 Hypokalemia: Secondary | ICD-10-CM

## 2021-07-04 DIAGNOSIS — E039 Hypothyroidism, unspecified: Secondary | ICD-10-CM

## 2021-07-04 MED ORDER — POTASSIUM CHLORIDE CRYS ER 10 MEQ PO TBCR: 10.0000 meq | EXTENDED_RELEASE_TABLET | Freq: Every day | ORAL | 0 refills | Status: DC

## 2021-07-04 MED ORDER — LEVOTHYROXINE SODIUM 175 MCG PO TABS: ORAL_TABLET | ORAL | 0 refills | Status: AC

## 2021-07-04 MED ORDER — FUROSEMIDE 20 MG PO TABS: 20.0000 mg | ORAL_TABLET | Freq: Every day | ORAL | 0 refills | Status: DC

## 2021-07-04 MED ORDER — METOPROLOL SUCCINATE ER 50 MG PO TB24: ORAL_TABLET | ORAL | 0 refills | Status: AC

## 2021-07-05 DIAGNOSIS — M549 Dorsalgia, unspecified: Secondary | ICD-10-CM | POA: Diagnosis not present

## 2021-07-11 DIAGNOSIS — R519 Headache, unspecified: Secondary | ICD-10-CM | POA: Diagnosis not present

## 2021-07-11 DIAGNOSIS — R051 Acute cough: Secondary | ICD-10-CM | POA: Diagnosis not present

## 2021-07-11 DIAGNOSIS — J209 Acute bronchitis, unspecified: Secondary | ICD-10-CM | POA: Diagnosis not present

## 2021-07-11 DIAGNOSIS — I10 Essential (primary) hypertension: Secondary | ICD-10-CM | POA: Diagnosis not present

## 2021-07-11 DIAGNOSIS — U071 COVID-19: Secondary | ICD-10-CM | POA: Diagnosis not present

## 2021-07-11 DIAGNOSIS — R0982 Postnasal drip: Secondary | ICD-10-CM | POA: Diagnosis not present

## 2021-07-31 ENCOUNTER — Other Ambulatory Visit: Payer: Self-pay | Admitting: Family Medicine

## 2021-07-31 ENCOUNTER — Other Ambulatory Visit: Payer: Self-pay | Admitting: Nurse Practitioner

## 2021-07-31 DIAGNOSIS — E876 Hypokalemia: Secondary | ICD-10-CM

## 2021-07-31 DIAGNOSIS — R609 Edema, unspecified: Secondary | ICD-10-CM

## 2021-08-25 DIAGNOSIS — E039 Hypothyroidism, unspecified: Secondary | ICD-10-CM | POA: Diagnosis not present

## 2021-08-25 DIAGNOSIS — Z23 Encounter for immunization: Secondary | ICD-10-CM | POA: Diagnosis not present

## 2021-08-25 DIAGNOSIS — R739 Hyperglycemia, unspecified: Secondary | ICD-10-CM | POA: Diagnosis not present

## 2021-08-25 DIAGNOSIS — I4821 Permanent atrial fibrillation: Secondary | ICD-10-CM | POA: Diagnosis not present

## 2021-09-19 ENCOUNTER — Other Ambulatory Visit (HOSPITAL_COMMUNITY): Payer: Self-pay | Admitting: Internal Medicine

## 2021-09-19 ENCOUNTER — Telehealth: Payer: Self-pay

## 2021-09-19 DIAGNOSIS — Z1231 Encounter for screening mammogram for malignant neoplasm of breast: Secondary | ICD-10-CM

## 2021-09-19 DIAGNOSIS — I482 Chronic atrial fibrillation, unspecified: Secondary | ICD-10-CM

## 2021-09-19 NOTE — Telephone Encounter (Signed)
-----   Message from Josue Hector, MD sent at 09/19/2021  4:15 PM EST ----- Regarding: RE: Request for testing Her afib is chronic last echo 2019 ok to order and updated one  ----- Message ----- From: Bernita Raisin, RN Sent: 09/19/2021   3:43 PM EST To: Josue Hector, MD Subject: FW: Request for testing                        I will ask Dr.Nishan if she needs testing before he sees the patient. ----- Message ----- From: Alvin Critchley Sent: 09/19/2021   2:41 PM EST To: Bernita Raisin, RN Subject: Request for testing                            Patient requested follow up visit for February and also sent this message below.  I haven't had any cardiac diagnostic testing since atrial fib diagnosed. Don't I need ekg, echo or scan?  Please let me know which, if any, testing she needs. Thanks, [employer]

## 2021-09-20 NOTE — Telephone Encounter (Signed)
Echo apt made

## 2021-10-10 ENCOUNTER — Ambulatory Visit (HOSPITAL_COMMUNITY)
Admission: RE | Admit: 2021-10-10 | Discharge: 2021-10-10 | Disposition: A | Discharge status: Home / Self Care | Payer: Medicare Other | Source: Ambulatory Visit | Attending: Internal Medicine | Admitting: Internal Medicine

## 2021-10-10 ENCOUNTER — Other Ambulatory Visit: Payer: Self-pay

## 2021-10-10 DIAGNOSIS — Z1231 Encounter for screening mammogram for malignant neoplasm of breast: Secondary | ICD-10-CM

## 2021-11-07 ENCOUNTER — Other Ambulatory Visit: Payer: Self-pay | Admitting: Family Medicine

## 2021-11-07 DIAGNOSIS — E039 Hypothyroidism, unspecified: Secondary | ICD-10-CM

## 2021-11-24 ENCOUNTER — Ambulatory Visit (INDEPENDENT_AMBULATORY_CARE_PROVIDER_SITE_OTHER): Payer: Medicare Other

## 2021-11-24 DIAGNOSIS — I482 Chronic atrial fibrillation, unspecified: Secondary | ICD-10-CM

## 2021-11-24 LAB — ECHOCARDIOGRAM COMPLETE
AR max vel: 3.06 cm2
AV Area VTI: 2.83 cm2
AV Area mean vel: 2.58 cm2
AV Mean grad: 4 mmHg
AV Peak grad: 6.6 mmHg
Ao pk vel: 1.28 m/s
Area-P 1/2: 3.56 cm2
Calc EF: 60.8 %
S' Lateral: 3.49 cm
Single Plane A2C EF: 66.7 %
Single Plane A4C EF: 49.8 %

## 2021-12-08 NOTE — Progress Notes (Signed)
Cardiologist:  Jacinta Shoe new to me    71 y.o. with permanent afib. Rate control with lopressor and anticoagulation with xarelto  The patient denies any symptoms of chest pain, palpitations, shortness of breath, lightheadedness, dizziness, orthopnea, PND, and syncope.  TTE reviewed 11/24/21 EF 55% mild LAE mild MR moderate TR   She is retired Therapist, sports takes lasix for LE edema Happily married Has two sisters    Like reading and going to [employer]. Picks up grand kids from school and is  On a Education officer, museum   Social history: She is married.  She has 3 sons and several grandchildren.  She is a retired Equities trader.  She worked from [id number].  Review of Systems: As per "subjective", otherwise negative.  No Known Allergies  Current Outpatient Medications  Medication Sig Dispense Refill   acetaminophen (TYLENOL) 500 MG tablet Take 1,000 mg by mouth every 6 (six) hours as needed for mild pain or headache.     albuterol (VENTOLIN HFA) 108 (90 Base) MCG/ACT inhaler TAKE 2 PUFFS BY MOUTH EVERY 6 HOURS AS NEEDED FOR WHEEZE OR SHORTNESS OF BREATH 18 each 0   Ascorbic Acid (VITAMIN C) 1000 MG tablet Take 1,000 mg by mouth daily.     b complex vitamins tablet Take 1 tablet by mouth daily.     budesonide-formoterol (SYMBICORT) 160-4.5 MCG/ACT inhaler      CALCIUM-MAGNESIUM-ZINC PO Take 2 tablets by mouth at bedtime.     cetirizine (ZYRTEC) 10 MG tablet Take 10 mg by mouth daily as needed for allergies.      Cholecalciferol (VITAMIN D-3) 125 MCG (5000 UT) TABS Take 5,000 Units by mouth daily.     Coenzyme Q10 (CO Q 10) 100 MG CAPS Take 100 mg by mouth at bedtime.     furosemide (LASIX) 20 MG tablet TAKE 1 TABLET BY MOUTH EVERY DAY 90 tablet 0   KLOR-CON M10 10 MEQ tablet TAKE 1 TABLET BY MOUTH EVERY DAY 90 tablet 0   levothyroxine (SYNTHROID) 175 MCG tablet Take one tablet daily 90 tablet 0   Lutein 10 MG TABS Take 10 mg by mouth daily.     metoprolol succinate (TOPROL-XL) 50 MG  24 hr tablet Take with or immediately following a meal. 90 tablet 0   rivaroxaban (XARELTO) 20 MG TABS tablet Take 1 tablet (20 mg total) by mouth daily with supper. 30 tablet 6   No current facility-administered medications for this visit.    Past Medical History:  Diagnosis Date   Arthritis    Asthma    DDD (degenerative disc disease)    Dysrhythmia    A fib    GERD (gastroesophageal reflux disease)    Hypertension    Hypothyroidism    Pneumonia 1/13   Reactive airway disease     Past Surgical History:  Procedure Laterality Date   APPENDECTOMY     CESAREAN SECTION     x 3   COLONOSCOPY N/A 04/14/2015   Procedure: COLONOSCOPY;  Surgeon: Rogene Houston, MD;  [facility];  Service: Endoscopy;  Laterality: N/A;  830 - moved to 11:30 - Ann to notify pt   TONSILLECTOMY     TOTAL KNEE ARTHROPLASTY  06/03/2012   Procedure: TOTAL KNEE ARTHROPLASTY;  Surgeon: Gearlean Alf, MD;  Location: [facility];  Service: Orthopedics;  Laterality: Left;   TOTAL KNEE ARTHROPLASTY Right 05/24/2020   Procedure: TOTAL KNEE ARTHROPLASTY;  Surgeon: Gaynelle Arabian, MD;  Location: [facility];  Service: Orthopedics;  Laterality: Right;  69min    Social History   Socioeconomic History   Marital status: Married    Spouse name: Not on file   Number of children: Not on file   Years of education: Not on file   Highest education level: Not on file  Occupational History   Not on file  Tobacco Use   Smoking status: Former    Packs/day: 0.25    Types: Cigarettes    Quit date: 05/25/2007    Years since quitting: 14.5   Smokeless tobacco: Never  Vaping Use   Vaping Use: Never used  Substance and Sexual Activity   Alcohol use: Yes    Comment: mixed drink nightly   Drug use: No   Sexual activity: Not Currently  Other Topics Concern   Not on file  Social History Narrative   Not on file   Social Determinants of Health   Financial Resource Strain: Not on file  Food Insecurity: Not on file   Transportation Needs: Not on file  Physical Activity: Not on file  Stress: Not on file  Social Connections: Not on file  Intimate Partner Violence: Not on file    Orson Slick LPN was present throughout the entirety of the encounter.  Vitals:   12/14/21 0952  BP: 108/66  Pulse: 77  SpO2: 98%  Weight: 204 lb 9.6 oz (92.8 kg)  Height: 5\' 3"  (1.6 m)     Wt Readings from Last 3 Encounters:  12/14/21 204 lb 9.6 oz (92.8 kg)  04/11/21 211 lb 6.4 oz (95.9 kg)  12/06/20 210 lb (95.3 kg)     PHYSICAL EXAM  BP 108/66    Pulse 77    Ht 5\' 3"  (1.6 m)    Wt 204 lb 9.6 oz (92.8 kg)    SpO2 98%    BMI 36.24 kg/m  Affect appropriate Healthy:  appears stated age 82: normal Neck supple with no adenopathy JVP normal no bruits no thyromegaly Lungs clear with no wheezing and good diaphragmatic motion Heart:  S1/S2 no murmur, no rub, gallop or click PMI normal Abdomen: benighn, BS positve, no tenderness, no AAA no bruit.  No HSM or HJR Distal pulses intact with no bruits Plus one bilateral edema Neuro non-focal Skin warm and dry Post bilateral TKR 's    Labs: Lab Results  Component Value Date/Time   K 4.4 04/08/2021 10:18 AM   BUN 21 04/08/2021 10:18 AM   CREATININE 0.91 04/08/2021 10:18 AM   CREATININE 0.98 09/30/2014 08:03 AM   ALT 21 04/08/2021 10:18 AM   TSH 1.420 04/08/2021 10:18 AM   HGB 13.6 04/08/2021 10:18 AM     Lipids: Lab Results  Component Value Date/Time   LDLCALC 51 04/08/2021 10:18 AM   CHOL 163 04/08/2021 10:18 AM   TRIG 45 04/08/2021 10:18 AM   HDL 102 04/08/2021 10:18 AM       ASSESSMENT AND PLAN:  1. Afib:  Chronic rate control with lopressor and anticoagulation with xarelto  f/U labs ordered   2.  Edema: Takes Lasix 20 mg daily. Dependant low sodium diet   3. Thyroid:  on synthroid replacement TSH with primary TSH normal 04/08/21    Disposition: Follow up 1 yr   Jenkins Rouge

## 2021-12-12 ENCOUNTER — Other Ambulatory Visit: Payer: Self-pay | Admitting: Family Medicine

## 2021-12-12 DIAGNOSIS — I4891 Unspecified atrial fibrillation: Secondary | ICD-10-CM

## 2021-12-14 ENCOUNTER — Ambulatory Visit (INDEPENDENT_AMBULATORY_CARE_PROVIDER_SITE_OTHER): Payer: Medicare Other | Admitting: Cardiovascular Disease

## 2021-12-14 ENCOUNTER — Encounter: Payer: Self-pay | Admitting: Cardiovascular Disease

## 2021-12-14 ENCOUNTER — Other Ambulatory Visit: Payer: Self-pay

## 2021-12-14 VITALS — BP 108/66 | HR 77 | Ht 63.0 in | Wt 204.6 lb

## 2021-12-14 DIAGNOSIS — I482 Chronic atrial fibrillation, unspecified: Secondary | ICD-10-CM

## 2021-12-14 DIAGNOSIS — R609 Edema, unspecified: Secondary | ICD-10-CM

## 2021-12-14 NOTE — Patient Instructions (Signed)
Medication Instructions:  Your physician recommends that you continue on your current medications as directed. Please refer to the Current Medication list given to you today.  *If you need a refill on your cardiac medications before your next appointment, please call your pharmacy*   Lab Work: NONE   If you have labs (blood work) drawn today and your tests are completely normal, you will receive your results only by: . MyChart Message (if you have MyChart) OR . A paper copy in the mail If you have any lab test that is abnormal or we need to change your treatment, we will call you to review the results.   Testing/Procedures: NONE    Follow-Up: At [facility], you and your health needs are our priority.  As part of our continuing mission to provide you with exceptional heart care, we have created designated Provider Care Teams.  These Care Teams include your primary Cardiologist (physician) and Advanced Practice Providers (APPs -  Physician Assistants and Nurse Practitioners) who all work together to provide you with the care you need, when you need it.  We recommend signing up for the patient portal called "MyChart".  Sign up information is provided on this After Visit Summary.  MyChart is used to connect with patients for Virtual Visits (Telemedicine).  Patients are able to view lab/test results, encounter notes, upcoming appointments, etc.  Non-urgent messages can be sent to your provider as well.   To learn more about what you can do with MyChart, go to [email or web address].    Your next appointment:   1 year(s)  The format for your next appointment:   In Person  Provider:   Peter Nishan, MD   Other Instructions Thank you for choosing [facility]!    

## 2022-02-09 DIAGNOSIS — H43812 Vitreous degeneration, left eye: Secondary | ICD-10-CM | POA: Diagnosis not present

## 2022-02-09 DIAGNOSIS — H524 Presbyopia: Secondary | ICD-10-CM | POA: Diagnosis not present

## 2022-02-09 DIAGNOSIS — H52223 Regular astigmatism, bilateral: Secondary | ICD-10-CM | POA: Diagnosis not present

## 2022-02-09 DIAGNOSIS — H43811 Vitreous degeneration, right eye: Secondary | ICD-10-CM | POA: Diagnosis not present

## 2022-02-09 DIAGNOSIS — H25813 Combined forms of age-related cataract, bilateral: Secondary | ICD-10-CM | POA: Diagnosis not present

## 2022-02-09 DIAGNOSIS — H5203 Hypermetropia, bilateral: Secondary | ICD-10-CM | POA: Diagnosis not present

## 2022-03-09 ENCOUNTER — Other Ambulatory Visit: Payer: Self-pay

## 2022-03-09 MED ORDER — RIVAROXABAN 20 MG PO TABS: 20.0000 mg | ORAL_TABLET | Freq: Every day | ORAL | 6 refills | Status: DC

## 2022-03-09 NOTE — Telephone Encounter (Signed)
[facility] is requesting a refill on Xarelto. Please address

## 2022-03-09 NOTE — Telephone Encounter (Signed)
Prescription refill request for Xarelto received.  Indication: Atrial Fib Last office visit: 12/03/21  Ashlee James MD Weight: 92.8kg Age: 71 Scr: 0.83 on 08/25/21 CrCl: 89.76  Based on above findings Xarelto '20mg'$  daily is the appropriate dose.  Refill approved.

## 2022-03-24 ENCOUNTER — Encounter (INDEPENDENT_AMBULATORY_CARE_PROVIDER_SITE_OTHER): Payer: Self-pay | Admitting: *Deleted

## 2022-04-06 DIAGNOSIS — I480 Paroxysmal atrial fibrillation: Secondary | ICD-10-CM | POA: Diagnosis not present

## 2022-04-06 DIAGNOSIS — E039 Hypothyroidism, unspecified: Secondary | ICD-10-CM | POA: Diagnosis not present

## 2022-04-06 DIAGNOSIS — Z1159 Encounter for screening for other viral diseases: Secondary | ICD-10-CM | POA: Diagnosis not present

## 2022-04-14 DIAGNOSIS — Z1239 Encounter for other screening for malignant neoplasm of breast: Secondary | ICD-10-CM | POA: Diagnosis not present

## 2022-04-14 DIAGNOSIS — Z23 Encounter for immunization: Secondary | ICD-10-CM | POA: Diagnosis not present

## 2022-04-14 DIAGNOSIS — N959 Unspecified menopausal and perimenopausal disorder: Secondary | ICD-10-CM | POA: Diagnosis not present

## 2022-04-14 DIAGNOSIS — Z0000 Encounter for general adult medical examination without abnormal findings: Secondary | ICD-10-CM | POA: Diagnosis not present

## 2022-04-26 ENCOUNTER — Other Ambulatory Visit (INDEPENDENT_AMBULATORY_CARE_PROVIDER_SITE_OTHER): Payer: Self-pay

## 2022-04-26 ENCOUNTER — Encounter (INDEPENDENT_AMBULATORY_CARE_PROVIDER_SITE_OTHER): Payer: Self-pay | Admitting: *Deleted

## 2022-04-26 DIAGNOSIS — Z800 Family history of malignant neoplasm of digestive organs: Secondary | ICD-10-CM

## 2022-04-26 DIAGNOSIS — Z1211 Encounter for screening for malignant neoplasm of colon: Secondary | ICD-10-CM

## 2022-05-09 ENCOUNTER — Encounter (INDEPENDENT_AMBULATORY_CARE_PROVIDER_SITE_OTHER): Payer: Self-pay

## 2022-05-09 ENCOUNTER — Telehealth: Payer: Self-pay | Admitting: *Deleted

## 2022-05-09 ENCOUNTER — Telehealth (INDEPENDENT_AMBULATORY_CARE_PROVIDER_SITE_OTHER): Payer: Self-pay

## 2022-05-09 DIAGNOSIS — I4891 Unspecified atrial fibrillation: Secondary | ICD-10-CM

## 2022-05-09 NOTE — Telephone Encounter (Signed)
   Pre-operative Risk Assessment    Patient Name: JAELYN BOURGOIN  DOB: Apr 14, 1951 MRN: [id number]      Request for Surgical Clearance    Procedure:   COLONOSCOPY  Date of Surgery:  Clearance 06/09/22                                 Surgeon:  DR. DANIEL CASTANEDA Surgeon's Group or Practice Name:  [facility] Phone number:  [phone] Fax number:  [phone]   Type of Clearance Requested:   - Pharmacy:  Hold Rivaroxaban (Xarelto) X'S 2 DAYS   Type of Anesthesia:   MAC   Additional requests/questions:    Astrid Divine   05/09/2022, 2:49 PM ]

## 2022-05-09 NOTE — Telephone Encounter (Signed)
Referring MD/PCP: Dr Allie Dimmer  Procedure: tcs  Reason/Indication:  Screening, fam hx of colon ca  Has patient had this procedure before?  Yes   If so, when, by whom and where?  04/14/15  Is there a family history of colon cancer?  Yes   Who?  What age when diagnosed?  Father & Aunt  Is patient diabetic? If yes, Type 1 or Type 2   No       Does patient have prosthetic heart valve or mechanical valve?  No   Do you have a pacemaker/defibrillator?  No   Has patient ever had endocarditis/atrial fibrillation? yes  Does patient use oxygen? No   Has patient had joint replacement within last 12 months?  no  Is patient constipated or do they take laxatives? No stool softeners   Does patient have a history of alcohol/drug use?  No   Have you had a stroke/heart attack last 6 mths? no  Do you take medicine for weight loss?  No   For female patients,: have you had a hysterectomy no                       are you post menopausal yes                      do you still have your menstrual cycle no   Is patient on blood thinner such as Coumadin, Plavix and/or Aspirin? yes  Medications: Xarelto '20mg'$  daily, Tolterodine 2 mg bid, levothyroxine 75 mcg daily, metoprolol 50 mg po qd, symbicort 150-45 mcg inhaler bid, furosemide 20 mg po qd, albuterol 90 mcg inhaler prn , Vit b complex po q day, klor con 10 meq daily,  Allergies: NKDA  Medication Adjustment per Dr Smitty Knudsen 2 DAYS PRIOR   Procedure date & time: 06/09/22 AT 815

## 2022-05-11 DIAGNOSIS — I4891 Unspecified atrial fibrillation: Secondary | ICD-10-CM | POA: Insufficient documentation

## 2022-05-11 NOTE — Telephone Encounter (Signed)
Patient with diagnosis of afib on Xarelto for anticoagulation.    Procedure: colonoscopy Date of procedure: 06/09/22  CHA2DS2-VASc Score = 3  This indicates a 3.2% annual risk of stroke. The patient's score is based upon: CHF History: 0 HTN History: 1 Diabetes History: 0 Stroke History: 0 Vascular Disease History: 0 Age Score: 1 Gender Score: 1   CrCl 32m/min using adjusted body weight due to obesity Platelet count 268K  Per office protocol, patient can hold Xarelto for 2 days prior to procedure as requested.    **This guidance is not considered finalized until pre-operative APP has relayed final recommendations.**

## 2022-05-11 NOTE — Telephone Encounter (Signed)
Primary Cardiologist:Peter Johnsie Cancel, MD   Preoperative team, please contact this patient and set up a phone call appointment for further preoperative risk assessment. Please obtain consent and complete medication review. Thank you for your help.   I confirm that guidance regarding antiplatelet and oral anticoagulation therapy has been completed and, if necessary, noted below.   Emmaline Life, NP-C    05/11/2022, 4:06 PM [place] [address], [address] Office [phone] Fax [phone]

## 2022-05-12 ENCOUNTER — Telehealth: Payer: Self-pay | Admitting: *Deleted

## 2022-05-12 NOTE — Telephone Encounter (Signed)
Pt agreeable to plan of care for tele pre op appt 05/23/22 @ 9:40. Med rec and consent are done.

## 2022-05-12 NOTE — Telephone Encounter (Signed)
Patient is returning call.  °

## 2022-05-12 NOTE — Telephone Encounter (Signed)
Pt agreeable to plan of care for tele pre op appt 05/23/22 @ 9:40. Med rec and consent are done.     Patient Consent for Virtual Visit        Ashlee Solomon has provided verbal consent on 05/12/2022 for a virtual visit (video or telephone).   CONSENT FOR VIRTUAL VISIT FOR:  Ashlee Solomon  By participating in this virtual visit I agree to the following:  I hereby voluntarily request, consent and authorize [facility] and its employed or contracted physicians, physician assistants, nurse practitioners or other licensed health care professionals (the Practitioner), to provide me with telemedicine health care services (the "Services") as deemed necessary by the treating Practitioner. I acknowledge and consent to receive the Services by the Practitioner via telemedicine. I understand that the telemedicine visit will involve communicating with the Practitioner through live audiovisual communication technology and the disclosure of certain medical information by electronic transmission. I acknowledge that I have been given the opportunity to request an in-person assessment or other available alternative prior to the telemedicine visit and am voluntarily participating in the telemedicine visit.  I understand that I have the right to withhold or withdraw my consent to the use of telemedicine in the course of my care at any time, without affecting my right to future care or treatment, and that the Practitioner or I may terminate the telemedicine visit at any time. I understand that I have the right to inspect all information obtained and/or recorded in the course of the telemedicine visit and may receive copies of available information for a reasonable fee.  I understand that some of the potential risks of receiving the Services via telemedicine include:  Delay or interruption in medical evaluation due to technological equipment failure or disruption; Information transmitted may not be sufficient (e.g. poor  resolution of images) to allow for appropriate medical decision making by the Practitioner; and/or  In rare instances, security protocols could fail, causing a breach of personal health information.  Furthermore, I acknowledge that it is my responsibility to provide information about my medical history, conditions and care that is complete and accurate to the best of my ability. I acknowledge that Practitioner's advice, recommendations, and/or decision may be based on factors not within their control, such as incomplete or inaccurate data provided by me or distortions of diagnostic images or specimens that may result from electronic transmissions. I understand that the practice of medicine is not an exact science and that Practitioner makes no warranties or guarantees regarding treatment outcomes. I acknowledge that a copy of this consent can be made available to me via my patient portal ([facility]), or I can request a printed copy by calling the office of [facility].    I understand that my insurance will be billed for this visit.   I have read or had this consent read to me. I understand the contents of this consent, which adequately explains the benefits and risks of the Services being provided via telemedicine.  I have been provided ample opportunity to ask questions regarding this consent and the Services and have had my questions answered to my satisfaction. I give my informed consent for the services to be provided through the use of telemedicine in my medical care

## 2022-05-12 NOTE — Telephone Encounter (Signed)
Left message for the pt to call back for a tele pre op appt.  

## 2022-05-16 ENCOUNTER — Telehealth (INDEPENDENT_AMBULATORY_CARE_PROVIDER_SITE_OTHER): Payer: Self-pay

## 2022-05-16 MED ORDER — PEG 3350-KCL-NA BICARB-NACL 420 G PO SOLR
4000.0000 mL | ORAL | 0 refills | Status: DC
Start: 2022-05-16 — End: 2022-06-09

## 2022-05-16 NOTE — Telephone Encounter (Signed)
Ashlee Solomon, CMA  ?

## 2022-05-23 ENCOUNTER — Telehealth: Payer: Self-pay | Admitting: Cardiovascular Disease

## 2022-05-23 ENCOUNTER — Ambulatory Visit (INDEPENDENT_AMBULATORY_CARE_PROVIDER_SITE_OTHER): Payer: Medicare Other | Admitting: Nurse Practitioner

## 2022-05-23 DIAGNOSIS — Z01810 Encounter for preprocedural cardiovascular examination: Secondary | ICD-10-CM

## 2022-05-23 NOTE — Telephone Encounter (Signed)
Patient called at 9:40 for scheduled preoperative telephone clearance appointment.  Detailed message was left for patient to return call.  Call reattempted 9:50 with no answer and patient will require rescheduling at her convenience.  Ambrose Pancoast, NP

## 2022-05-23 NOTE — Progress Notes (Unsigned)
Virtual Visit via Telephone Note   Because of Ashlee Solomon's co-morbid illnesses, she is at least at moderate risk for complications without adequate follow up.  This format is felt to be most appropriate for this patient at this time.  The patient did not have access to video technology/had technical difficulties with video requiring transitioning to audio format only (telephone).  All issues noted in this document were discussed and addressed.  No physical exam could be performed with this format.  Please refer to the patient's chart for her consent to telehealth for [facility].  Evaluation Performed:  Preoperative cardiovascular risk assessment _____________   Date:  05/23/2022   Patient ID:  Ashlee Solomon, DOB [birth year], MRN [id number] Patient Location:  Home Provider location:   Office  Primary Care Provider:  Majel Homer, MD Primary Cardiologist:  Charlton Haws, MD  Chief Complaint / Patient Profile   71 y.o. y/o female with a h/o *** who is pending colonoscopy on 06/09/2022 and presents today for telephonic preoperative cardiovascular risk assessment.  Past Medical History    Past Medical History:  Diagnosis Date   Arthritis    Asthma    DDD (degenerative disc disease)    Dysrhythmia    A fib    GERD (gastroesophageal reflux disease)    Hypertension    Hypothyroidism    Pneumonia 1/13   Reactive airway disease    Past Surgical History:  Procedure Laterality Date   APPENDECTOMY     CESAREAN SECTION     x 3   COLONOSCOPY N/A 04/14/2015   Procedure: COLONOSCOPY;  Surgeon: Malissa Hippo, MD;  [facility];  Service: Endoscopy;  Laterality: N/A;  830 - moved to 11:30 - Ann to notify pt   TONSILLECTOMY     TOTAL KNEE ARTHROPLASTY  06/03/2012   Procedure: TOTAL KNEE ARTHROPLASTY;  Surgeon: Loanne Drilling, MD;  Location: [facility];  Service: Orthopedics;  Laterality: Left;   TOTAL KNEE ARTHROPLASTY Right 05/24/2020   Procedure: TOTAL KNEE ARTHROPLASTY;   Surgeon: Ollen Gross, MD;  Location: [facility];  Service: Orthopedics;  Laterality: Right;     Allergies  No Known Allergies  History of Present Illness    Ashlee Solomon is a 71 y.o. female who presents via audio/video conferencing for a telehealth visit today.  Pt was last seen in cardiology clinic on 11/24/2021 by Dr. Eden Emms.  At that time [name] was doing well and denied symptoms of chest pain, palpitations, shortness of breath with some lower extremity edema.  The patient is now pending procedure as outlined above. Since her last visit, she ***   Home Medications    Prior to Admission medications   Medication Sig Start Date End Date Taking? Authorizing Provider  acetaminophen (TYLENOL) 500 MG tablet Take 1,000 mg by mouth every 6 (six) hours as needed for mild pain or headache.    [provider]  albuterol (VENTOLIN HFA) 108 (90 Base) MCG/ACT inhaler TAKE 2 PUFFS BY MOUTH EVERY 6 HOURS AS NEEDED FOR WHEEZE OR SHORTNESS OF BREATH 11/12/20   Novella Olive, NP  Ascorbic Acid (VITAMIN C) 1000 MG tablet Take 1,000 mg by mouth daily.    [provider]  b complex vitamins tablet Take 1 tablet by mouth daily.    [provider]  budesonide-formoterol [facility]) 160-4.5 MCG/ACT inhaler  11/30/21   [provider]  CALCIUM-MAGNESIUM-ZINC PO Take 2 tablets by mouth at bedtime.  [provider]  cetirizine (ZYRTEC) 10 MG tablet Take 10 mg by mouth daily as needed for allergies.     [provider]  Cholecalciferol (VITAMIN D-3) 125 MCG (5000 UT) TABS Take 5,000 Units by mouth daily.    [provider]  Coenzyme Q10 (CO Q 10) 100 MG CAPS Take 100 mg by mouth at bedtime.    [provider]  furosemide (LASIX) 20 MG tablet TAKE 1 TABLET BY MOUTH EVERY DAY 08/03/21   Campbell Riches, NP  KLOR-CON M10 10 MEQ tablet TAKE 1 TABLET BY MOUTH EVERY DAY 08/03/21   Campbell Riches, NP  levothyroxine (SYNTHROID) 175 MCG  tablet Take one tablet daily 07/04/21   Ladona Ridgel, Malena M, DO  Lutein 10 MG TABS Take 10 mg by mouth daily.    [provider]  metoprolol succinate (TOPROL-XL) 50 MG 24 hr tablet Take with or immediately following a meal. 07/04/21   Ladona Ridgel, Malena M, DO  polyethylene glycol-electrolytes (TRILYTE) 420 g solution Take 4,000 mLs by mouth as directed. 05/16/22   Dolores Frame, MD  rivaroxaban (XARELTO) 20 MG TABS tablet Take 1 tablet (20 mg total) by mouth daily with supper. 03/09/22   Wendall Stade, MD  tolterodine (DETROL) 2 MG tablet Take 2 mg by mouth daily. 03/31/22   [provider]    Physical Exam    Vital Signs:  [name] does not have vital signs available for review today.***  Given telephonic nature of communication, physical exam is limited. AAOx3. NAD. Normal affect.  Speech and respirations are unlabored.  Accessory Clinical Findings    None  Assessment & Plan    1.  Preoperative Cardiovascular Risk Assessment: { Click Here to Calculate RCRI      :161096045}  { Click Here to Calculate DASI      :409811914} {Select to add RCRI Risk (<1%=LOW; >/=1%=HIGH) (Optional):21036017}  {Select if HIGH (RCRI >/=1%) Risk (Optional):21036030} Recommendations: {2014 ACC/AHA Perioperative Guidelines  :21036001} Antiplatelet and/or Anticoagulation Recommendations: {Antiplatelet Recommendations                  :21036016} {Anticoagulation Recommendations           :78295621}    (Reminder: Include SBE prophylaxis/Antiplatelet/Anticoag Instructions***)  Per office protocol, patient can hold Xarelto for 2 days prior to procedure as requested.      A copy of this note will be routed to requesting surgeon.  Time:   Today, I have spent *** minutes with the patient with telehealth technology discussing medical history, symptoms, and management plan.     Napoleon Form, Leodis Rains, NP  05/23/2022, 8:10 AM

## 2022-05-23 NOTE — Telephone Encounter (Signed)
I s[place]w the [place] does not get great service and her land line is not working right and repair men to come out and fix her land line tomorrow. Pt did ask if she was going to be charged for the missed call today. I stated I am not sure how the billing works on that. Pt said ok. Pt is agreeable to plan of care for rescheduled tele pre op appt 05/30/22 @ 2 pm.   Pt did ask if we could try her land line first next week for the tele appt; [phone]; if still not work then try her cell # [phone].   Pt did say if her land line is still not working by Monday next week, she will try to call us and let us know.   I will update the requesting office pt tele pre op appt has been rescheduled to 05/30/22.

## 2022-05-23 NOTE — Telephone Encounter (Signed)
Thanks

## 2022-05-23 NOTE — Telephone Encounter (Signed)
I s/w the pt and [place] and her [place] phone does not get great service and her land line is not working right and repair men to come out and fix her land line tomorrow. Pt did ask if she was going to be charged for the missed call today. I stated I am not sure how the billing works on that. Pt said ok. Pt is agreeable to plan of care for rescheduled tele pre op appt 05/30/22 @ 2 pm.    Pt did ask if we could try her land line first next week for the tele appt; [phone]; if still not work then try her cell # [phone].    Pt did say if her land line is still not working by Monday next week, she will try to call us and let us know.    I will update the requesting office pt tele pre op appt has been rescheduled to 05/30/22.

## 2022-05-23 NOTE — Telephone Encounter (Signed)
Pt called saying she was supposed to have her preop tele visit today however, she never got a call. Please advise.

## 2022-05-29 NOTE — Progress Notes (Unsigned)
Virtual Visit via Telephone Note   Because of Ashlee Solomon's co-morbid illnesses, she is at least at moderate risk for complications without adequate follow up.  This format is felt to be most appropriate for this patient at this time.  The patient did not have access to video technology/had technical difficulties with video requiring transitioning to audio format only (telephone).  All issues noted in this document were discussed and addressed.  No physical exam could be performed with this format.  Please refer to the patient's chart for her consent to telehealth for [facility].  Evaluation Performed:  Preoperative cardiovascular risk assessment _____________   Date:  05/30/2022   Patient ID:  Ashlee Solomon, DOB 1951/10/13, MRN [id number] Patient Location:  Home Provider location:   Office  Primary Care Provider:  Allie Dimmer, MD Primary Cardiologist:  Jenkins Rouge, MD  Chief Complaint / Patient Profile   71 y.o. y/o female with a h/o permanent atrial fibrillation on chronic anticoagulation and chronic edema, who is pending colonoscopy and presents today for telephonic preoperative cardiovascular risk assessment.  Past Medical History    Past Medical History:  Diagnosis Date   Arthritis    Asthma    DDD (degenerative disc disease)    Dysrhythmia    A fib    GERD (gastroesophageal reflux disease)    Hypertension    Hypothyroidism    Pneumonia 1/13   Reactive airway disease    Past Surgical History:  Procedure Laterality Date   APPENDECTOMY     CESAREAN SECTION     x 3   COLONOSCOPY N/A 04/14/2015   Procedure: COLONOSCOPY;  Surgeon: Rogene Houston, MD;  [facility];  Service: Endoscopy;  Laterality: N/A;  830 - moved to 11:30 - Ann to notify pt   TONSILLECTOMY     TOTAL KNEE ARTHROPLASTY  06/03/2012   Procedure: TOTAL KNEE ARTHROPLASTY;  Surgeon: Gearlean Alf, MD;  Location: [facility];  Service: Orthopedics;  Laterality: Left;   TOTAL KNEE  ARTHROPLASTY Right 05/24/2020   Procedure: TOTAL KNEE ARTHROPLASTY;  Surgeon: Gaynelle Arabian, MD;  Location: [facility];  Service: Orthopedics;  Laterality: Right;  23mn    Allergies  No Known Allergies  History of Present Illness    Ashlee ROZMANis a 71y.o. female who presents via audio/video conferencing for a telehealth visit today.  Pt was last seen in cardiology clinic on 12/14/21 by Dr. NJohnsie Cancel  At that time EDONNELLE RUBEYwas doing well.  The patient is now pending procedure as outlined above. Since her last visit, she denies chest pain, shortness of breath, lower extremity edema, fatigue, palpitations, melena, hematuria, hemoptysis, diaphoresis, weakness, presyncope, syncope, orthopnea, and PND.  Home Medications    Prior to Admission medications   Medication Sig Start Date End Date Taking? Authorizing Provider  acetaminophen (TYLENOL) 500 MG tablet Take 1,000 mg by mouth every 6 (six) hours as needed for mild pain or headache.    [provider]  albuterol (VENTOLIN HFA) 108 (90 Base) MCG/ACT inhaler TAKE 2 PUFFS BY MOUTH EVERY 6 HOURS AS NEEDED FOR WHEEZE OR SHORTNESS OF BREATH 11/12/20   DChalmers Guest NP  Ascorbic Acid (VITAMIN C) 1000 MG tablet Take 1,000 mg by mouth daily.    [provider]  b complex vitamins tablet Take 1 tablet by mouth daily.    [provider]  budesonide-formoterol ([place] 160-4.5 MCG/ACT inhaler  11/30/21   [provider]  [facility]  PO Take 2 tablets by mouth at bedtime.    [provider]  cetirizine (ZYRTEC) 10 MG tablet Take 10 mg by mouth daily as needed for allergies.     [provider]  Cholecalciferol (VITAMIN D-3) 125 MCG (5000 UT) TABS Take 5,000 Units by mouth daily.    [provider]  Coenzyme Q10 (CO Q 10) 100 MG CAPS Take 100 mg by mouth at bedtime.    [provider]  furosemide (LASIX) 20 MG tablet TAKE 1 TABLET BY MOUTH EVERY DAY 08/03/21   Nilda Simmer, NP  KLOR-CON M10 10 MEQ tablet TAKE 1 TABLET BY MOUTH EVERY DAY 08/03/21   Nilda Simmer, NP  levothyroxine (SYNTHROID) 175 MCG tablet Take one tablet daily 07/04/21   Lovena Le, Malena M, DO  Lutein 10 MG TABS Take 10 mg by mouth daily.    [provider]  metoprolol succinate (TOPROL-XL) 50 MG 24 hr tablet Take with or immediately following a meal. 07/04/21   Lovena Le, Malena M, DO  polyethylene glycol-electrolytes (TRILYTE) 420 g solution Take 4,000 mLs by mouth as directed. 05/16/22   Harvel Quale, MD  rivaroxaban (XARELTO) 20 MG TABS tablet Take 1 tablet (20 mg total) by mouth daily with supper. 03/09/22   Josue Hector, MD  tolterodine (DETROL) 2 MG tablet Take 2 mg by mouth daily. 03/31/22   [provider]    Physical Exam    Vital Signs:  KYLANI WIRES does not have vital signs available for review today.  Given telephonic nature of communication, physical exam is limited. AAOx3. NAD. Normal affect.  Speech and respirations are unlabored.  Accessory Clinical Findings    None  Assessment & Plan    1.  Preoperative Cardiovascular Risk Assessment: The patient is doing well from a cardiac perspective. Therefore, based on ACC/AHA guidelines, the patient would be at acceptable risk for the planned procedure without further cardiovascular testing. The patient was advised that if he develops new symptoms prior to surgery to contact our office to arrange for a follow-up visit, and he verbalized understanding. According to the Revised Cardiac Risk Index (RCRI), her Perioperative Risk of Major Cardiac Event is (%): 0.4, Her Functional Capacity in METs is: 6.61 according to the Duke Activity Status Index (DASI). Per office protocol, patient can hold Xarelto for 2 days prior to procedure as requested.     A copy of this note will be routed to requesting surgeon.  Time:   Today, I have spent 7 minutes with the patient with telehealth technology  discussing medical history, symptoms, and management plan.     Emmaline Life, NP-C    05/30/2022, 2:06 PM [place] [address] Office [phone] Fax [phone]

## 2022-05-30 ENCOUNTER — Telehealth: Payer: Self-pay | Admitting: Cardiovascular Disease

## 2022-05-30 ENCOUNTER — Encounter: Payer: Self-pay | Admitting: Nurse Practitioner

## 2022-05-30 ENCOUNTER — Ambulatory Visit (INDEPENDENT_AMBULATORY_CARE_PROVIDER_SITE_OTHER): Payer: Medicare Other | Admitting: Nurse Practitioner

## 2022-05-30 DIAGNOSIS — Z01810 Encounter for preprocedural cardiovascular examination: Secondary | ICD-10-CM | POA: Diagnosis not present

## 2022-05-30 NOTE — Telephone Encounter (Signed)
Pt c/o medication issue:  1. Name of Medication:   rivaroxaban (XARELTO) 20 MG TABS tablet  2. How are you currently taking this medication (dosage and times per day)?   3. Are you having a reaction (difficulty breathing--STAT)?   4. What is your medication issue?   Caller stated patient signed up for cost saving program - [employer].  They will need a prescription for this medication.

## 2022-05-31 ENCOUNTER — Other Ambulatory Visit: Payer: Self-pay

## 2022-05-31 DIAGNOSIS — I4891 Unspecified atrial fibrillation: Secondary | ICD-10-CM

## 2022-05-31 MED ORDER — RIVAROXABAN 20 MG PO TABS: 20.0000 mg | ORAL_TABLET | Freq: Every day | ORAL | 1 refills | Status: DC

## 2022-05-31 NOTE — Telephone Encounter (Signed)
Provider portion of Xarelto patient assistance form completed and faxed to [facility]. To be signed by Dr.Nishan.

## 2022-05-31 NOTE — Telephone Encounter (Signed)
Prescription refill request for Xarelto received.  Indication: Afib  Last office visit: 05/30/22 Ann Maki)  Weight: 92.8kg Age: 71 Scr: 0.92 (04/06/22)  CrCl: 80.73m/min  Appropriate dose and refill sent to requested pharmacy.

## 2022-06-02 NOTE — Patient Instructions (Signed)
Ashlee Solomon  06/02/2022     '@PREFPERIOPPHARMACY'$ @   Your procedure is scheduled on  06/09/2022.   Report to [facility] at  [phone]  A.M.   Call this number if you have problems the morning of surgery:  [phone]   Remember:  Follow the diet and prep instructions given to you by the office.     Your last dose of xarelto should be on 06/06/2022.      Use your inhalers before you come and bring your rescue inhaler with you.     Take these medicines the morning of surgery with A SIP OF WATER               synthroid, claritin, metoprolol, detrol.     Do not wear jewelry, make-up or nail polish.  Do not wear lotions, powders, or perfumes, or deodorant.  Do not shave 48 hours prior to surgery.  Men may shave face and neck.  Do not bring valuables to the hospital.  [facility] is not responsible for any belongings or valuables.  Contacts, dentures or bridgework may not be worn into surgery.  Leave your suitcase in the car.  After surgery it may be brought to your room.  For patients admitted to the hospital, discharge time will be determined by your treatment team.  Patients discharged the day of surgery will not be allowed to drive home and must have someone with them for 24 hours.    Special instructions:   DO NOT smoke tobacco or vape for 24 hours before your procedure.  Please read over the following fact sheets that you were given. Anesthesia Post-op Instructions and Care and Recovery After Surgery      Colonoscopy, Adult, Care After The following information offers guidance on how to care for yourself after your procedure. Your health care provider may also give you more specific instructions. If you have problems or questions, contact your health care provider. What can I expect after the procedure? After the procedure, it is common to have: A small amount of blood in your stool for 24 hours after the procedure. Some gas. Mild cramping or bloating of your  abdomen. Follow these instructions at home: Eating and drinking  Drink enough fluid to keep your urine pale yellow. Follow instructions from your health care provider about eating or drinking restrictions. Resume your normal diet as told by your health care provider. Avoid heavy or fried foods that are hard to digest. Activity Rest as told by your health care provider. Avoid sitting for a long time without moving. Get up to take short walks every 1-2 hours. This is important to improve blood flow and breathing. Ask for help if you feel weak or unsteady. Return to your normal activities as told by your health care provider. Ask your health care provider what activities are safe for you. Managing cramping and bloating  Try walking around when you have cramps or feel bloated. If directed, apply heat to your abdomen as told by your health care provider. Use the heat source that your health care provider recommends, such as a moist heat pack or a heating pad. Place a towel between your skin and the heat source. Leave the heat on for 20-30 minutes. Remove the heat if your skin turns bright red. This is especially important if you are unable to feel pain, heat, or cold. You have a greater risk of getting burned. General instructions If you were  given a sedative during the procedure, it can affect you for several hours. Do not drive or operate machinery until your health care provider says that it is safe. For the first 24 hours after the procedure: Do not sign important documents. Do not drink alcohol. Do your regular daily activities at a slower pace than normal. Eat soft foods that are easy to digest. Take over-the-counter and prescription medicines only as told by your health care provider. Keep all follow-up visits. This is important. Contact a health care provider if: You have blood in your stool 2-3 days after the procedure. Get help right away if: You have more than a small spotting of  blood in your stool. You have large blood clots in your stool. You have swelling of your abdomen. You have nausea or vomiting. You have a fever. You have increasing pain in your abdomen that is not relieved with medicine. These symptoms may be an emergency. Get help right away. Call 911. Do not wait to see if the symptoms will go away. Do not drive yourself to the hospital. Summary After the procedure, it is common to have a small amount of blood in your stool. You may also have mild cramping and bloating of your abdomen. If you were given a sedative during the procedure, it can affect you for several hours. Do not drive or operate machinery until your health care provider says that it is safe. Get help right away if you have a lot of blood in your stool, nausea or vomiting, a fever, or increased pain in your abdomen. This information is not intended to replace advice given to you by your health care provider. Make sure you discuss any questions you have with your health care provider. Document Revised: 06/01/2021 Document Reviewed: 06/01/2021 Elsevier Patient Education  [place] After This sheet gives you information about how to care for yourself after your procedure. Your health care provider may also give you more specific instructions. If you have problems or questions, contact your health care provider. What can I expect after the procedure? After the procedure, it is common to have: Tiredness. Forgetfulness about what happened after the procedure. Impaired judgment for important decisions. Nausea or vomiting. Some difficulty with balance. Follow these instructions at home: For the time period you were told by your health care provider:     Rest as needed. Do not participate in activities where you could fall or become injured. Do not drive or use machinery. Do not drink alcohol. Do not take sleeping pills or medicines that cause  drowsiness. Do not make important decisions or sign legal documents. Do not take care of children on your own. Eating and drinking Follow the diet that is recommended by your health care provider. Drink enough fluid to keep your urine pale yellow. If you vomit: Drink water, juice, or soup when you can drink without vomiting. Make sure you have little or no nausea before eating solid foods. General instructions Have a responsible adult stay with you for the time you are told. It is important to have someone help care for you until you are awake and alert. Take over-the-counter and prescription medicines only as told by your health care provider. If you have sleep apnea, surgery and certain medicines can increase your risk for breathing problems. Follow instructions from your health care provider about wearing your sleep device: Anytime you are sleeping, including during daytime naps. While taking prescription pain medicines, sleeping medicines,  or medicines that make you drowsy. Avoid smoking. Keep all follow-up visits as told by your health care provider. This is important. Contact a health care provider if: You keep feeling nauseous or you keep vomiting. You feel light-headed. You are still sleepy or having trouble with balance after 24 hours. You develop a rash. You have a fever. You have redness or swelling around the IV site. Get help right away if: You have trouble breathing. You have new-onset confusion at home. Summary For several hours after your procedure, you may feel tired. You may also be forgetful and have poor judgment. Have a responsible adult stay with you for the time you are told. It is important to have someone help care for you until you are awake and alert. Rest as told. Do not drive or operate machinery. Do not drink alcohol or take sleeping pills. Get help right away if you have trouble breathing, or if you suddenly become confused. This information is not  intended to replace advice given to you by your health care provider. Make sure you discuss any questions you have with your health care provider. Document Revised: 09/13/2021 Document Reviewed: 09/11/2019 Elsevier Patient Education  [place].

## 2022-06-05 ENCOUNTER — Encounter (HOSPITAL_COMMUNITY)
Admission: RE | Admit: 2022-06-05 | Discharge: 2022-06-05 | Disposition: A | Discharge status: Home / Self Care | Payer: Medicare Other | Source: Ambulatory Visit | Attending: Gastroenterology | Admitting: Gastroenterology

## 2022-06-05 DIAGNOSIS — Z1211 Encounter for screening for malignant neoplasm of colon: Secondary | ICD-10-CM

## 2022-06-05 DIAGNOSIS — Z800 Family history of malignant neoplasm of digestive organs: Secondary | ICD-10-CM

## 2022-06-05 DIAGNOSIS — Z01812 Encounter for preprocedural laboratory examination: Secondary | ICD-10-CM | POA: Insufficient documentation

## 2022-06-05 LAB — BASIC METABOLIC PANEL
Anion gap: 8 (ref 5–15)
BUN: 25 mg/dL — ABNORMAL HIGH (ref 8–23)
CO2: 24 mmol/L (ref 22–32)
Calcium: 9.7 mg/dL (ref 8.9–10.3)
Chloride: 103 mmol/L (ref 98–111)
Creatinine, Ser: 0.9 mg/dL (ref 0.44–1.00)
GFR, Estimated: >60 mL/min (ref >60)
Glucose, Bld: 96 mg/dL (ref 70–99)
Potassium: 4.3 mmol/L (ref 3.5–5.1)
Sodium: 135 mmol/L (ref 135–145)

## 2022-06-09 ENCOUNTER — Encounter (HOSPITAL_COMMUNITY)
Admission: RE | Disposition: A | Discharge status: Home / Self Care | Payer: Self-pay | Source: Ambulatory Visit | Attending: Gastroenterology

## 2022-06-09 ENCOUNTER — Other Ambulatory Visit: Payer: Self-pay

## 2022-06-09 ENCOUNTER — Ambulatory Visit (HOSPITAL_COMMUNITY)
Admission: RE | Admit: 2022-06-09 | Discharge: 2022-06-09 | Disposition: A | Discharge status: Home / Self Care | Payer: Medicare Other | Source: Ambulatory Visit | Attending: Gastroenterology | Admitting: Gastroenterology

## 2022-06-09 ENCOUNTER — Ambulatory Visit (HOSPITAL_BASED_OUTPATIENT_CLINIC_OR_DEPARTMENT_OTHER): Payer: Medicare Other | Admitting: Anesthesiology

## 2022-06-09 ENCOUNTER — Ambulatory Visit (HOSPITAL_COMMUNITY): Payer: Medicare Other | Admitting: Anesthesiology

## 2022-06-09 DIAGNOSIS — I10 Essential (primary) hypertension: Secondary | ICD-10-CM | POA: Insufficient documentation

## 2022-06-09 DIAGNOSIS — Z87891 Personal history of nicotine dependence: Secondary | ICD-10-CM | POA: Insufficient documentation

## 2022-06-09 DIAGNOSIS — J45909 Unspecified asthma, uncomplicated: Secondary | ICD-10-CM | POA: Insufficient documentation

## 2022-06-09 DIAGNOSIS — Z1211 Encounter for screening for malignant neoplasm of colon: Secondary | ICD-10-CM | POA: Insufficient documentation

## 2022-06-09 DIAGNOSIS — D759 Disease of blood and blood-forming organs, unspecified: Secondary | ICD-10-CM | POA: Diagnosis not present

## 2022-06-09 DIAGNOSIS — K648 Other hemorrhoids: Secondary | ICD-10-CM | POA: Diagnosis not present

## 2022-06-09 DIAGNOSIS — D649 Anemia, unspecified: Secondary | ICD-10-CM | POA: Diagnosis not present

## 2022-06-09 DIAGNOSIS — D122 Benign neoplasm of ascending colon: Secondary | ICD-10-CM | POA: Insufficient documentation

## 2022-06-09 DIAGNOSIS — Z7901 Long term (current) use of anticoagulants: Secondary | ICD-10-CM | POA: Diagnosis not present

## 2022-06-09 DIAGNOSIS — E039 Hypothyroidism, unspecified: Secondary | ICD-10-CM | POA: Diagnosis not present

## 2022-06-09 DIAGNOSIS — K635 Polyp of colon: Secondary | ICD-10-CM | POA: Insufficient documentation

## 2022-06-09 DIAGNOSIS — I4891 Unspecified atrial fibrillation: Secondary | ICD-10-CM | POA: Diagnosis not present

## 2022-06-09 DIAGNOSIS — Z800 Family history of malignant neoplasm of digestive organs: Secondary | ICD-10-CM | POA: Diagnosis not present

## 2022-06-09 HISTORY — PX: COLONOSCOPY WITH PROPOFOL: SHX5780

## 2022-06-09 HISTORY — PX: POLYPECTOMY: SHX149

## 2022-06-09 LAB — HM COLONOSCOPY

## 2022-06-09 SURGERY — COLONOSCOPY WITH PROPOFOL
Anesthesia: General

## 2022-06-09 MED ORDER — LIDOCAINE HCL (CARDIAC) PF 100 MG/5ML IV SOSY
PREFILLED_SYRINGE | INTRAVENOUS | Status: DC | PRN
  Administered 2022-06-09: 60 mg via INTRATRACHEAL

## 2022-06-09 MED ORDER — LACTATED RINGERS IV SOLN: INTRAVENOUS | Status: DC | PRN

## 2022-06-09 MED ORDER — PROPOFOL 10 MG/ML IV BOLUS
INTRAVENOUS | Status: DC | PRN
  Administered 2022-06-09: 100 mg via INTRAVENOUS

## 2022-06-09 MED ORDER — PROPOFOL 500 MG/50ML IV EMUL
INTRAVENOUS | Status: DC | PRN
  Administered 2022-06-09: 150 ug/kg/min via INTRAVENOUS

## 2022-06-09 NOTE — Op Note (Signed)
[facility] Patient Name: Ashlee Solomon Procedure Date: 06/09/2022 8:14 AM MRN: [id number] Date of Birth: 05-Apr-1951 Attending MD: Maylon Peppers ,  CSN: [id number] Age: 70 Admit Type: Outpatient Procedure:                Colonoscopy Indications:              Screening for colorectal malignant neoplasm Providers:                Maylon Peppers, Crystal Page, Raphael Gibney,                            Technician Referring MD:              Medicines:                Monitored Anesthesia Care Complications:            No immediate complications. Estimated Blood Loss:     Estimated blood loss: none. Procedure:                Pre-Anesthesia Assessment:                           - Prior to the procedure, a History and Physical                            was performed, and patient medications, allergies                            and sensitivities were reviewed. The patient's                            tolerance of previous anesthesia was reviewed.                           - The risks and benefits of the procedure and the                            sedation options and risks were discussed with the                            patient. All questions were answered and informed                            consent was obtained.                           - ASA Grade Assessment: II - A patient with mild                            systemic disease.                           After obtaining informed consent, the colonoscope                            was passed under direct vision. Throughout the  procedure, the patient's blood pressure, pulse, and                            oxygen saturations were monitored continuously. The                            [id number]-[id number] ([id number]) scope was introduced through                            the anus and advanced to the the cecum, identified                            by appendiceal orifice and ileocecal valve. The                             colonoscopy was performed without difficulty. The                            patient tolerated the procedure well. The quality                            of the bowel preparation was good. Scope In: 8:27:29 AM Scope Out: 6:59:93 AM Scope Withdrawal Time: 0 hours 12 minutes 37 seconds  Total Procedure Duration: 0 hours 19 minutes 28 seconds  Findings:      The perianal and digital rectal examinations were normal.      Two sessile polyps were found in the sigmoid colon and ascending colon.       The polyps were 4 to 6 mm in size. These polyps were removed with a cold       snare. Resection and retrieval were complete.      Non-bleeding internal hemorrhoids were found during retroflexion. The       hemorrhoids were small. Impression:               - Two 4 to 6 mm polyps in the sigmoid colon and in                            the ascending colon, removed with a cold snare.                            Resected and retrieved.                           - Non-bleeding internal hemorrhoids. Moderate Sedation:      Per Anesthesia Care Recommendation:           - Discharge patient to home (ambulatory).                           - Resume previous diet.                           - Await pathology results.                           - Repeat colonoscopy for surveillance  based on                            pathology results.                           -Restart Xarelto tonight. Procedure Code(s):        --- Professional ---                           [phone], Colonoscopy, flexible; with removal of                            tumor(s), polyp(s), or other lesion(s) by snare                            technique Diagnosis Code(s):        --- Professional ---                           Z12.11, Encounter for screening for malignant                            neoplasm of colon                           K63.5, Polyp of colon                           K64.8, Other hemorrhoids CPT copyright 2019 American  Medical Association. All rights reserved. The codes documented in this report are preliminary and upon coder review may  be revised to meet current compliance requirements. Maylon Peppers, MD Maylon Peppers,  06/09/2022 8:50:22 AM This report has been signed electronically. Number of Addenda: 0

## 2022-06-09 NOTE — H&P (Signed)
Ashlee Solomon is an 71 y.o. female.   Chief Complaint: CRC screening  HPI: 71 y/o F with past medical history of asthma, hypertension, hypothyroidism, coming for colorectal cancer screening.  Last colonoscopy was in 2016 which was negative.  The patient denies having any complaints such as melena, hematochezia, abdominal pain or distention, change in her bowel movement consistency or frequency, no changes in weight recently.  3 was diagnosed with colon cancer and he is mid 23s   Past Medical History:  Diagnosis Date   Arthritis    Asthma    DDD (degenerative disc disease)    Dysrhythmia    A fib    GERD (gastroesophageal reflux disease)    Hypertension    Hypothyroidism    Pneumonia 1/13   Reactive airway disease     Past Surgical History:  Procedure Laterality Date   APPENDECTOMY     CESAREAN SECTION     x 3   COLONOSCOPY N/A 04/14/2015   Procedure: COLONOSCOPY;  Surgeon: Rogene Houston, MD;  [facility];  Service: Endoscopy;  Laterality: N/A;  830 - moved to 11:30 - Ann to notify pt   TONSILLECTOMY     TOTAL KNEE ARTHROPLASTY  06/03/2012   Procedure: TOTAL KNEE ARTHROPLASTY;  Surgeon: Gearlean Alf, MD;  [facility];  Service: Orthopedics;  Laterality: Left;   TOTAL KNEE ARTHROPLASTY Right 05/24/2020   Procedure: TOTAL KNEE ARTHROPLASTY;  Surgeon: Gaynelle Arabian, MD;  Location[facility];  Service: Orthopedics;  Laterality: Right;  44mn    Family History  Problem Relation Age of Onset   Leukemia Mother    Heart disease Father        pacemaker   Breast cancer Sister        history   Heart failure Brother    Diabetes Brother    Melanoma Maternal Grandmother    Suicidality Maternal Grandfather    Stroke Paternal Grandmother    Stroke Paternal Grandfather    Hodgkin's lymphoma Brother    Social History:  reports that she quit smoking about 15 years ago. Her smoking use included cigarettes. She smoked an average of .25 packs per day. She has never used  smokeless tobacco. She reports current alcohol use. She reports that she does not use drugs.  Allergies: No Known Allergies  Medications Prior to Admission  Medication Sig Dispense Refill   acetaminophen (TYLENOL) 500 MG tablet Take 1,000 mg by mouth every 6 (six) hours as needed for mild pain or headache (Sleep).     albuterol (VENTOLIN HFA) 108 (90 Base) MCG/ACT inhaler TAKE 2 PUFFS BY MOUTH EVERY 6 HOURS AS NEEDED FOR WHEEZE OR SHORTNESS OF BREATH 18 each 0   Ascorbic Acid (VITAMIN C) 1000 MG tablet Take 1,000 mg by mouth daily.     b complex vitamins tablet Take 1 tablet by mouth daily.     budesonide-formoterol (SYMBICORT) 160-4.5 MCG/ACT inhaler Inhale 1 puff into the lungs 2 (two) times daily.     Calcium-Magnesium-Zinc (CAL-MAG-ZINC PO) Take 3 tablets by mouth at bedtime.     Cholecalciferol (VITAMIN D-3) 125 MCG (5000 UT) TABS Take 5,000 Units by mouth daily.     Coenzyme Q10 (CO Q 10) 100 MG CAPS Take 100 mg by mouth at bedtime.     Docusate Calcium (STOOL SOFTENER PO) Take 2 tablets by mouth at bedtime.     furosemide (LASIX) 20 MG tablet TAKE 1 TABLET BY MOUTH EVERY DAY 90 tablet 0  KLOR-CON M10 10 MEQ tablet TAKE 1 TABLET BY MOUTH EVERY DAY 90 tablet 0   levothyroxine (SYNTHROID) 175 MCG tablet Take one tablet daily 90 tablet 0   loratadine (CLARITIN) 10 MG tablet Take 10 mg by mouth daily.     Lutein 10 MG TABS Take 10 mg by mouth daily.     metoprolol succinate (TOPROL-XL) 50 MG 24 hr tablet Take with or immediately following a meal. 90 tablet 0   Multiple Vitamins-Minerals (ZINC PO) Take 1 tablet by mouth daily.     polyethylene glycol-electrolytes (TRILYTE) 420 g solution Take 4,000 mLs by mouth as directed. 4000 mL 0   rivaroxaban (XARELTO) 20 MG TABS tablet Take 1 tablet (20 mg total) by mouth daily with supper. 90 tablet 1   tolterodine (DETROL) 2 MG tablet Take 2 mg by mouth daily.      No results found for this or any previous visit (from the past 48 hour(s)). No  results found.  Review of Systems  All other systems reviewed and are negative.   Blood pressure 113/71, pulse 80, temperature 98 F (36.7 C), resp. rate 12, height '5\' 3"'$  (1.6 m), weight 92 kg, SpO2 99 %. Physical Exam  GENERAL: The patient is AO x3, in no acute distress. HEENT: Head is normocephalic and atraumatic. EOMI are intact. Mouth is well hydrated and without lesions. NECK: Supple. No masses LUNGS: Clear to auscultation. No presence of rhonchi/wheezing/rales. Adequate chest expansion HEART: RRR, normal s1 and s2. ABDOMEN: Soft, nontender, no guarding, no peritoneal signs, and nondistended. BS +. No masses. EXTREMITIES: Without any cyanosis, clubbing, rash, lesions or edema. NEUROLOGIC: AOx3, no focal motor deficit. SKIN: no jaundice, no rashes  Assessment/Plan 71 y/o F with past medical history of asthma, hypertension, hypothyroidism, coming for colorectal cancer screening. The patient is at average risk for colorectal cancer.  We will proceed with colonoscopy today.   Harvel Quale, MD 06/09/2022, 8:22 AM

## 2022-06-09 NOTE — Anesthesia Postprocedure Evaluation (Signed)
Anesthesia Post Note  Patient: Ashlee Solomon  Procedure(s) Performed: COLONOSCOPY WITH PROPOFOL POLYPECTOMY INTESTINAL  Patient location during evaluation: Phase II Anesthesia Type: General Level of consciousness: awake and alert and oriented Pain management: pain level controlled Vital Signs Assessment: post-procedure vital signs reviewed and stable Respiratory status: spontaneous breathing, nonlabored ventilation and respiratory function stable Cardiovascular status: blood pressure returned to baseline and stable Postop Assessment: no apparent nausea or vomiting Anesthetic complications: no   No notable events documented.   Last Vitals:  Vitals:   06/09/22 0853 06/09/22 0857  BP:  92/66  Pulse: 74   Resp: 15   Temp: 36.7 C   SpO2: 96%     Last Pain:  Vitals:   06/09/22 0855  TempSrc:   PainSc: 0-No pain                 Kekai Geter C Jahmez Bily

## 2022-06-09 NOTE — Anesthesia Preprocedure Evaluation (Signed)
Anesthesia Evaluation  Patient identified by MRN, date of birth, ID band Patient awake    Reviewed: Allergy & Precautions, NPO status , Patient's Chart, lab work & pertinent test results, reviewed documented beta blocker date and time   Airway Mallampati: II  TM Distance: >3 FB Neck ROM: Full    Dental  (+) Dental Advisory Given, Implants   Pulmonary asthma , pneumonia, former smoker,    Pulmonary exam normal breath sounds clear to auscultation       Cardiovascular Exercise Tolerance: Good hypertension, Pt. on medications and Pt. on home beta blockers + dysrhythmias Atrial Fibrillation  Rhythm:Irregular Rate:Normal  1. Left ventricular ejection fraction, by estimation, is 55%. The left ventricle has normal function. The left ventricle has no regional wall motion abnormalities. Left ventricular diastolic parameters are  indeterminate. Normal global longitudinal  strain of -19.1%.  2. Right ventricular systolic function is low normal. The right ventricular size is mildly enlarged. There is mildly elevated pulmonary artery systolic pressure. The estimated right ventricular systolic  pressure is 25.7 mmHg.  3. Left atrial size was mildly dilated.  4. Right atrial size was moderately dilated.  5. The mitral valve is grossly normal. Mild mitral valve regurgitation.  6. Tricuspid valve regurgitation is moderate.  7. The aortic valve is tricuspid. Aortic valve regurgitation is not visualized. No aortic stenosis is present. Aortic valve mean gradient  measures 4.0 mmHg.  8. The inferior vena cava is normal in size with <50% respiratory  variability, suggesting right atrial pressure of 8 mmHg.    Neuro/Psych negative neurological ROS  negative psych ROS   GI/Hepatic Neg liver ROS, GERD  Controlled,  Endo/Other  Hypothyroidism   Renal/GU negative Renal ROS  negative genitourinary   Musculoskeletal  (+) Arthritis ,  Osteoarthritis,    Abdominal   Peds negative pediatric ROS (+)  Hematology  (+) Blood dyscrasia, anemia ,   Anesthesia Other Findings   Reproductive/Obstetrics negative OB ROS                             Anesthesia Physical Anesthesia Plan  ASA: 3  Anesthesia Plan: General   Post-op Pain Management: Minimal or no pain anticipated   Induction: Intravenous  PONV Risk Score and Plan: Propofol infusion  Airway Management Planned: Nasal Cannula and Natural Airway  Additional Equipment:   Intra-op Plan:   Post-operative Plan:   Informed Consent: I have reviewed the patients History and Physical, chart, labs and discussed the procedure including the risks, benefits and alternatives for the proposed anesthesia with the patient or authorized representative who has indicated his/her understanding and acceptance.     Dental advisory given  Plan Discussed with: CRNA and Surgeon  Anesthesia Plan Comments:         Anesthesia Quick Evaluation

## 2022-06-09 NOTE — Transfer of Care (Signed)
Immediate Anesthesia Transfer of Care Note  Patient: Ashlee Solomon  Procedure(s) Performed: COLONOSCOPY WITH PROPOFOL POLYPECTOMY INTESTINAL  Patient Location: Short Stay  Anesthesia Type:General  Level of Consciousness: sedated  Airway & Oxygen Therapy: Patient Spontanous Breathing  Post-op Assessment: Report given to RN and Post -op Vital signs reviewed and stable  Post vital signs: Reviewed and stable  Last Vitals:  Vitals Value Taken Time  BP 90/60   Temp 36   Pulse 76   Resp 16   SpO2 96     Last Pain:  Vitals:   06/09/22 0825  PainSc: 0-No pain         Complications: No notable events documented.

## 2022-06-09 NOTE — Discharge Instructions (Addendum)
You are being discharged to home.  Resume your previous diet.  We are waiting for your pathology results.  Your physician has recommended a repeat colonoscopy for surveillance based on pathology results.  Restart Xarelto tonight.

## 2022-06-12 ENCOUNTER — Encounter (INDEPENDENT_AMBULATORY_CARE_PROVIDER_SITE_OTHER): Payer: Self-pay | Admitting: *Deleted

## 2022-06-12 LAB — SURGICAL PATHOLOGY

## 2022-06-15 ENCOUNTER — Encounter (HOSPITAL_COMMUNITY): Payer: Self-pay | Admitting: Gastroenterology

## 2022-10-04 ENCOUNTER — Other Ambulatory Visit (HOSPITAL_COMMUNITY): Payer: Self-pay | Admitting: Internal Medicine

## 2022-10-04 DIAGNOSIS — Z1231 Encounter for screening mammogram for malignant neoplasm of breast: Secondary | ICD-10-CM

## 2022-10-12 ENCOUNTER — Ambulatory Visit (HOSPITAL_COMMUNITY)
Admission: RE | Admit: 2022-10-12 | Discharge: 2022-10-12 | Disposition: A | Discharge status: Home / Self Care | Payer: Medicare Other | Source: Ambulatory Visit | Attending: Internal Medicine | Admitting: Internal Medicine

## 2022-10-12 DIAGNOSIS — Z1231 Encounter for screening mammogram for malignant neoplasm of breast: Secondary | ICD-10-CM | POA: Diagnosis present

## 2022-12-04 NOTE — Progress Notes (Signed)
CARDIOLOGY CONSULT NOTE       Patient ID: Ashlee Solomon MRN: [id number] DOB/AGE: 1951-03-27 72 y.o.  Primary Physician: Allie Dimmer, MD Primary Cardiologist: Johnsie Cancel   HPI:  72 y.o. seen in f/u for chronic afib, edema and HTN.  First seen by me 12/14/21 Had been in afib and followed by Dr Bronson Ing since atleast 2019 TTE done 11/24/21 with EF 55% Mild LAE, moderate RAE and mild MR She is on Toprol for rate control and Xarelto for anticoagulation   She is retired Married with grand kids Read[place] and like the beach. On local community [employer]  August 2023 has two polyps removed on colonoscopy   She is on thyroid replacement   No cardiac symptoms or bleeding issues   ROS All other systems reviewed and negative except as noted above  Past Medical History:  Diagnosis Date   Arthritis    Asthma    DDD (degenerative disc disease)    Dysrhythmia    A fib    GERD (gastroesophageal reflux disease)    Hypertension    Hypothyroidism    Pneumonia 1/13   Reactive airway disease     Family History  Problem Relation Age of Onset   Leukemia Mother    Heart disease Father        pacemaker   Breast cancer Sister        history   Heart failure Brother    Diabetes Brother    Melanoma Maternal Grandmother    Suicidality Maternal Grandfather    Stroke Paternal Grandmother    Stroke Paternal Grandfather    Hodgkin's lymphoma Brother     Social History   Socioeconomic History   Marital status: Married    Spouse name: Not on file   Number of children: Not on file   Years of education: Not on file   Highest education level: Not on file  Occupational History   Not on file  Tobacco Use   Smoking status: Former    Packs/day: 0.25    Types: Cigarettes    Quit date: 05/25/2007    Years since quitting: 15.5   Smokeless tobacco: Never  Vaping Use   Vaping Use: Never used  Substance and Sexual Activity   Alcohol use: Yes    Comment: mixed drink nightly   Drug use: No    Sexual activity: Not Currently  Other Topics Concern   Not on file  Social History Narrative   Not on file   Social Determinants of Health   Financial Resource Strain: Not on file  Food Insecurity: Not on file  Transportation Needs: Not on file  Physical Activity: Not on file  Stress: Not on file  Social Connections: Not on file  Intimate Partner Violence: Not on file    Past Surgical History:  Procedure Laterality Date   APPENDECTOMY     CESAREAN SECTION     x 3   COLONOSCOPY N/A 04/14/2015   Procedure: COLONOSCOPY;  Surgeon: Rogene Houston, MD;  [facility];  Service: Endoscopy;  Laterality: N/A;  830 - moved to 11:30 - Ann to notify pt   COLONOSCOPY WITH PROPOFOL N/A 06/09/2022   Procedure: COLONOSCOPY WITH PROPOFOL;  Surgeon: Harvel Quale, MD;  [facility];  Service: Gastroenterology;  Laterality: N/A;  815   POLYPECTOMY  06/09/2022   Procedure: POLYPECTOMY INTESTINAL;  Surgeon: Harvel Quale, MD;  [facility];  Service: Gastroenterology;;   TONSILLECTOMY  TOTAL KNEE ARTHROPLASTY  06/03/2012   Procedure: TOTAL KNEE ARTHROPLASTY;  Surgeon: Gearlean Alf, MD;  Location: [facility];  Service: Orthopedics;  Laterality: Left;   TOTAL KNEE ARTHROPLASTY Right 05/24/2020   Procedure: TOTAL KNEE ARTHROPLASTY;  Surgeon: Gaynelle Arabian, MD;  Location: [facility];  Service: Orthopedics;  Laterality: Right;  36mn      Current Outpatient Medications:    acetaminophen (TYLENOL) 500 MG tablet, Take 1,000 mg by mouth every 6 (six) hours as needed for mild pain or headache (Sleep)., Disp: , Rfl:    albuterol (VENTOLIN HFA) 108 (90 Base) MCG/ACT inhaler, TAKE 2 PUFFS BY MOUTH EVERY 6 HOURS AS NEEDED FOR WHEEZE OR SHORTNESS OF BREATH, Disp: 18 each, Rfl: 0   Ascorbic Acid (VITAMIN C) 1000 MG tablet, Take 1,000 mg by mouth daily., Disp: , Rfl:    b complex vitamins tablet, Take 1 tablet by mouth daily., Disp: , Rfl:    budesonide-formoterol  (SYMBICORT) 160-4.5 MCG/ACT inhaler, Inhale 1 puff into the lungs 2 (two) times daily., Disp: , Rfl:    Calcium-Magnesium-Zinc (CAL-MAG-ZINC PO), Take 3 tablets by mouth at bedtime., Disp: , Rfl:    Cholecalciferol (VITAMIN D-3) 125 MCG (5000 UT) TABS, Take 5,000 Units by mouth daily., Disp: , Rfl:    Coenzyme Q10 (CO Q 10) 100 MG CAPS, Take 100 mg by mouth at bedtime., Disp: , Rfl:    Docusate Calcium (STOOL SOFTENER PO), Take 2 tablets by mouth at bedtime., Disp: , Rfl:    furosemide (LASIX) 20 MG tablet, TAKE 1 TABLET BY MOUTH EVERY DAY, Disp: 90 tablet, Rfl: 0   KLOR-CON M10 10 MEQ tablet, TAKE 1 TABLET BY MOUTH EVERY DAY, Disp: 90 tablet, Rfl: 0   levothyroxine (SYNTHROID) 175 MCG tablet, Take one tablet daily, Disp: 90 tablet, Rfl: 0   loratadine (CLARITIN) 10 MG tablet, Take 10 mg by mouth daily., Disp: , Rfl:    Lutein 10 MG TABS, Take 10 mg by mouth daily., Disp: , Rfl:    metoprolol succinate (TOPROL-XL) 50 MG 24 hr tablet, Take with or immediately following a meal., Disp: 90 tablet, Rfl: 0   Multiple Vitamins-Minerals (ZINC PO), Take 1 tablet by mouth daily., Disp: , Rfl:    rivaroxaban (XARELTO) 20 MG TABS tablet, Take 1 tablet (20 mg total) by mouth daily with supper., Disp: 90 tablet, Rfl: 1   tolterodine (DETROL) 2 MG tablet, Take 2 mg by mouth daily., Disp: , Rfl:     Physical Exam: Blood pressure 126/74, pulse 91, height '5\' 3"'$  (1.6 m), weight 191 lb 12.8 oz (87 kg), SpO2 96 %.   Affect appropriate Healthy:  appears stated age H68 normal Neck supple with no adenopathy JVP normal no bruits no thyromegaly Lungs clear with no wheezing and good diaphragmatic motion Heart:  S1/S2 no murmur, no rub, gallop or click PMI normal Abdomen: benighn, BS positve, no tenderness, no AAA no bruit.  No HSM or HJR Distal pulses intact with no bruits Plus one  edema Neuro non-focal Skin warm and dry No muscular weakness   Labs:   Lab Results  Component Value Date   WBC 6.4  04/08/2021   HGB 13.6 04/08/2021   HCT 39.5 04/08/2021   MCV 88 04/08/2021   PLT 235 04/08/2021   No results for input(s): "NA", "K", "CL", "CO2", "BUN", "CREATININE", "CALCIUM", "PROT", "BILITOT", "ALKPHOS", "ALT", "AST", "GLUCOSE" in the last 168 hours.  Invalid input(s): "LABALBU" No results found for: "CKTOTAL", "CKMB", "CKMBINDEX", "TROPONINI"  Lab  Results  Component Value Date   CHOL 163 04/08/2021   CHOL 149 04/08/2020   CHOL 150 09/26/2019   Lab Results  Component Value Date   HDL 102 04/08/2021   HDL 97 04/08/2020   HDL 87 09/26/2019   Lab Results  Component Value Date   LDLCALC 51 04/08/2021   LDLCALC 41 04/08/2020   LDLCALC 52 09/26/2019   Lab Results  Component Value Date   TRIG 45 04/08/2021   TRIG 51 04/08/2020   TRIG 48 09/26/2019   Lab Results  Component Value Date   CHOLHDL 1.6 04/08/2021   CHOLHDL 1.5 04/08/2020   CHOLHDL 1.7 09/26/2019   No results found for: "LDLDIRECT"    Radiology: No results found.  EKG: 12/14/21 AFib rate 77 otherwise normal ECG    ASSESSMENT AND PLAN:   AFib :  chronic good rate control and anticoagulation continue xarelto and toprol CBS/BMET fine this winter  Thyroid;  continue synthroid replacement TSH normal 10/11/22  Polyps:  post removal August 2023 Hct 44.4 f/u with GI Peripheral Edema:  dependant EF normal continue lasix   F/U in a year   Signed: Jenkins Rouge 12/18/2022, 11:34 AM

## 2022-12-18 ENCOUNTER — Encounter: Payer: Self-pay | Admitting: Cardiovascular Disease

## 2022-12-18 ENCOUNTER — Ambulatory Visit: Payer: Medicare Other | Attending: Cardiovascular Disease | Admitting: Cardiovascular Disease

## 2022-12-18 VITALS — BP 126/74 | HR 91 | Ht 63.0 in | Wt 191.8 lb

## 2022-12-18 DIAGNOSIS — Z7901 Long term (current) use of anticoagulants: Secondary | ICD-10-CM

## 2022-12-18 DIAGNOSIS — R609 Edema, unspecified: Secondary | ICD-10-CM

## 2022-12-18 DIAGNOSIS — I4891 Unspecified atrial fibrillation: Secondary | ICD-10-CM

## 2022-12-18 DIAGNOSIS — Z5181 Encounter for therapeutic drug level monitoring: Secondary | ICD-10-CM | POA: Diagnosis not present

## 2022-12-18 NOTE — Patient Instructions (Signed)
Medication Instructions:  Your physician recommends that you continue on your current medications as directed. Please refer to the Current Medication list given to you today.  *If you need a refill on your cardiac medications before your next appointment, please call your pharmacy*   Lab Work: NONE   If you have labs (blood work) drawn today and your tests are completely normal, you will receive your results only by: [place] (if you have MyChart) OR A paper copy in the mail If you have any lab test that is abnormal or we need to change your treatment, we will call you to review the results.   Testing/Procedures: NONE    Follow-Up: At [facility], you and your health needs are our priority.  As part of our continuing mission to provide you with exceptional heart care, we have created designated Provider Care Teams.  These Care Teams include your primary Cardiologist (physician) and Advanced Practice Providers (APPs -  Physician Assistants and Nurse Practitioners) who all work together to provide you with the care you need, when you need it.  We recommend signing up for the patient portal called "MyChart".  Sign up information is provided on this After Visit Summary.  MyChart is used to connect with patients for Virtual Visits (Telemedicine).  Patients are able to view lab/test results, encounter notes, upcoming appointments, etc.  Non-urgent messages can be sent to your provider as well.   To learn more about what you can do with MyChart, go to [email or web address].    Your next appointment:   1 year(s)  Provider:   You may see Jenkins Rouge, MD or one of the following Advanced Practice Providers on your designated Care Team:   Bernerd Pho, PA-C  Ermalinda Barrios, PA-C     Other Instructions Thank you for choosing [facility]!

## 2023-03-07 ENCOUNTER — Other Ambulatory Visit: Payer: Self-pay | Admitting: Cardiovascular Disease

## 2023-03-07 DIAGNOSIS — I4891 Unspecified atrial fibrillation: Secondary | ICD-10-CM

## 2023-03-07 NOTE — Telephone Encounter (Signed)
Prescription refill request for Xarelto received.  Indication: Afib  Last office visit: 12/18/22 Eden Emms)  Weight: 87kg Age: 72 Scr: 0.90 (06/05/22)  CrCl: 76.61ml/min  Appropriate dose. Refill sent.

## 2023-06-20 ENCOUNTER — Other Ambulatory Visit: Payer: Self-pay | Admitting: Cardiovascular Disease

## 2023-06-20 DIAGNOSIS — I4891 Unspecified atrial fibrillation: Secondary | ICD-10-CM

## 2023-06-20 NOTE — Telephone Encounter (Signed)
Prescription refill request for Xarelto received.  Indication: Afib  Last office visit: 12/18/22 Eden Emms)  Weight: 87kg Age: 72 Scr: 0.90 (04/27/23)  CrCl: 76.71ml/min  Appropriate dose. Refill sent.

## 2023-10-01 ENCOUNTER — Other Ambulatory Visit (HOSPITAL_COMMUNITY): Payer: Self-pay | Admitting: Internal Medicine

## 2023-10-01 DIAGNOSIS — Z1231 Encounter for screening mammogram for malignant neoplasm of breast: Secondary | ICD-10-CM

## 2023-10-22 ENCOUNTER — Ambulatory Visit (HOSPITAL_COMMUNITY)
Admission: RE | Admit: 2023-10-22 | Discharge: 2023-10-22 | Disposition: A | Discharge status: Home / Self Care | Payer: Medicare Other | Source: Ambulatory Visit | Attending: Internal Medicine | Admitting: Internal Medicine

## 2023-10-22 ENCOUNTER — Encounter (HOSPITAL_COMMUNITY): Payer: Self-pay

## 2023-10-22 DIAGNOSIS — Z1231 Encounter for screening mammogram for malignant neoplasm of breast: Secondary | ICD-10-CM | POA: Insufficient documentation

## 2023-12-14 ENCOUNTER — Other Ambulatory Visit: Payer: Self-pay | Admitting: Cardiovascular Disease

## 2023-12-14 DIAGNOSIS — I4891 Unspecified atrial fibrillation: Secondary | ICD-10-CM

## 2023-12-14 NOTE — Telephone Encounter (Signed)
Prescription refill request for Xarelto received.  Indication:afib Last office visit:2/24 Weight:87  kg Age:73 Scr:0.93  1/25 CrCl:72.89  ml/min  Prescription refilled

## 2024-01-30 NOTE — Progress Notes (Signed)
 CARDIOLOGY CONSULT NOTE       Patient ID: Ashlee Solomon MRN: [id number] DOB/AGE: Jun 23, 1951 73 y.o.  Primary Physician: Harolyn Likes, MD Primary Cardiologist: Stann Earnest   HPI:  73 y.o. seen in f/u for chronic afib, edema and HTN.  First seen by me 12/14/21 Had been in afib and followed by Dr Wanetta Guthrie since atleast 2019 TTE done 11/24/21 with EF 55% Mild LAE, moderate RAE and mild MR She is on Toprol  for rate control and Xarelto  for anticoagulation   She is retired Married with grand kids [place] and like the beach. On local community [employer]  August 2023 has two polyps removed on colonoscopy   She is on thyroid  replacement   No cardiac symptoms or bleeding issues   Noted Hct 59.9 with WBC 8.1 and PLT 305 on labs 11/01/23 Don't see any comments about this from her primary who ordered testing   She is overweight with some some asthma  COVID positive 12/06/2023 Rx with molnupiravir  Denies fatigue, headache , dizziness pruritus   ROS All other systems reviewed and negative except as noted above  Past Medical History:  Diagnosis Date   Arthritis    Asthma    DDD (degenerative disc disease)    Dysrhythmia    A fib    GERD (gastroesophageal reflux disease)    Hypertension    Hypothyroidism    Pneumonia 1/13   Reactive airway disease     Family History  Problem Relation Age of Onset   Leukemia Mother    Heart disease Father        pacemaker   Breast cancer Sister        history   Heart failure Brother    Diabetes Brother    Melanoma Maternal Grandmother    Suicidality Maternal Grandfather    Stroke Paternal Grandmother    Stroke Paternal Grandfather    Hodgkin's lymphoma Brother     Social History   Socioeconomic History   Marital status: Married    Spouse name: Not on file   Number of children: Not on file   Years of education: Not on file   Highest education level: Not on file  Occupational History   Not on file  Tobacco Use   Smoking status:  Former    Current packs/day: 0.00    Types: Cigarettes    Quit date: 05/25/2007    Years since quitting: 16.7   Smokeless tobacco: Never  Vaping Use   Vaping status: Never Used  Substance and Sexual Activity   Alcohol use: Yes    Comment: mixed drink nightly   Drug use: No   Sexual activity: Not Currently  Other Topics Concern   Not on file  Social History Narrative   Not on file   Social Drivers of Health   Financial Resource Strain: Not on file  Food Insecurity: Not on file  Transportation Needs: Not on file  Physical Activity: Not on file  Stress: Not on file  Social Connections: Not on file  Intimate Partner Violence: Not on file    Past Surgical History:  Procedure Laterality Date   APPENDECTOMY     CESAREAN SECTION     x 3   COLONOSCOPY N/A 04/14/2015   Procedure: COLONOSCOPY;  Surgeon: [name] Corporal, MD;  [facility];  Service: Endoscopy;  Laterality: N/A;  830 - moved to 11:30 - Ann to notify pt   COLONOSCOPY WITH PROPOFOL  N/A 06/09/2022   Procedure: COLONOSCOPY  WITH PROPOFOL ;  Surgeon: Umberto Ganong, Bearl Limes, MD;  [facility];  Service: Gastroenterology;  Laterality: N/A;  815   POLYPECTOMY  06/09/2022   Procedure: POLYPECTOMY INTESTINAL;  Surgeon: Urban Garden, MD;  [facility];  Service: Gastroenterology;;   TONSILLECTOMY     TOTAL KNEE ARTHROPLASTY  06/03/2012   Procedure: TOTAL KNEE ARTHROPLASTY;  Surgeon: Aurther Blue, MD;  [facility];  Service: Orthopedics;  Laterality: Left;   TOTAL KNEE ARTHROPLASTY Right 05/24/2020   Procedure: TOTAL KNEE ARTHROPLASTY;  Surgeon: Liliane Rei, MD;  Location: [facility];  Service: Orthopedics;  Laterality: Right;       Current Outpatient Medications:    acetaminophen  (TYLENOL ) 500 MG tablet, Take 1,000 mg by mouth every 6 (six) hours as needed for mild pain or headache (Sleep)., Disp: , Rfl:    albuterol  (VENTOLIN  HFA) 108 (90 Base) MCG/ACT inhaler, TAKE 2  PUFFS BY MOUTH EVERY 6 HOURS AS NEEDED FOR WHEEZE OR SHORTNESS OF BREATH, Disp: 18 each, Rfl: 0   Ascorbic Acid (VITAMIN C) 1000 MG tablet, Take 1,000 mg by mouth daily., Disp: , Rfl:    b complex vitamins tablet, Take 1 tablet by mouth daily., Disp: , Rfl:    budesonide-formoterol (SYMBICORT) 160-4.5 MCG/ACT inhaler, Inhale 1 puff into the lungs 2 (two) times daily., Disp: , Rfl:    Calcium-Magnesium-Zinc (CAL-MAG-ZINC PO), Take 3 tablets by mouth at bedtime., Disp: , Rfl:    Cholecalciferol (VITAMIN D-3) 125 MCG (5000 UT) TABS, Take 5,000 Units by mouth daily., Disp: , Rfl:    Coenzyme Q10 (CO Q 10) 100 MG CAPS, Take 100 mg by mouth at bedtime., Disp: , Rfl:    Docusate Calcium (STOOL SOFTENER PO), Take 2 tablets by mouth at bedtime., Disp: , Rfl:    furosemide  (LASIX ) 20 MG tablet, TAKE 1 TABLET BY MOUTH EVERY DAY, Disp: 90 tablet, Rfl: 0   KLOR-CON  M10 10 MEQ tablet, TAKE 1 TABLET BY MOUTH EVERY DAY, Disp: 90 tablet, Rfl: 0   levothyroxine  (SYNTHROID ) 175 MCG tablet, Take one tablet daily, Disp: 90 tablet, Rfl: 0   loratadine  (CLARITIN ) 10 MG tablet, Take 10 mg by mouth daily., Disp: , Rfl:    Lutein 10 MG TABS, Take 10 mg by mouth daily., Disp: , Rfl:    metoprolol  succinate (TOPROL -XL) 50 MG 24 hr tablet, Take with or immediately following a meal., Disp: 90 tablet, Rfl: 0   Multiple Vitamins-Minerals (ZINC PO), Take 1 tablet by mouth daily., Disp: , Rfl:    tolterodine (DETROL) 2 MG tablet, Take 2 mg by mouth daily., Disp: , Rfl:    XARELTO  20 MG TABS tablet, TAKE ONE TABLET BY MOUTH EVERY DAY with SUPPER, Disp: 90 tablet, Rfl: 1    Physical Exam: Blood pressure 102/74, pulse 73, height 5\' 3"  (1.6 m), weight 196 lb (88.9 kg), SpO2 96%.   Affect appropriate Healthy:  appears stated age HEENT: normal Neck supple with no adenopathy JVP normal no bruits no thyromegaly Lungs clear with no wheezing and good diaphragmatic motion Heart:  S1/S2 no murmur, no rub, gallop or click PMI  normal Abdomen: benighn, BS positve, no tenderness, no AAA no bruit.  No HSM or HJR Distal pulses intact with no bruits Plus one  edema Neuro non-focal Skin warm and dry No muscular weakness   Labs:   Lab Results  Component Value Date   WBC 6.4 04/08/2021   HGB 13.6 04/08/2021   HCT 39.5 04/08/2021  MCV 88 04/08/2021   PLT 235 04/08/2021   No results for input(s): "NA", "K", "CL", "CO2", "BUN", "CREATININE", "CALCIUM", "PROT", "BILITOT", "ALKPHOS", "ALT", "AST", "GLUCOSE" in the last 168 hours.  Invalid input(s): "LABALBU" No results found for: "CKTOTAL", "CKMB", "CKMBINDEX", "TROPONINI"  Lab Results  Component Value Date   CHOL 163 04/08/2021   CHOL 149 04/08/2020   CHOL 150 09/26/2019   Lab Results  Component Value Date   HDL 102 04/08/2021   HDL 97 04/08/2020   HDL 87 09/26/2019   Lab Results  Component Value Date   LDLCALC 51 04/08/2021   LDLCALC 41 04/08/2020   LDLCALC 52 09/26/2019   Lab Results  Component Value Date   TRIG 45 04/08/2021   TRIG 51 04/08/2020   TRIG 48 09/26/2019   Lab Results  Component Value Date   CHOLHDL 1.6 04/08/2021   CHOLHDL 1.5 04/08/2020   CHOLHDL 1.7 09/26/2019   No results found for: "LDLDIRECT"    Radiology: No results found.  EKG: 02/11/2024 afib rate 81 nonspecific ST changes    ASSESSMENT AND PLAN:   AFib :  chronic good rate control and anticoagulation continue xarelto  and toprol  Thyroid ;  continue synthroid  replacement TSH normal 11/01/23 Polyps:  post removal August 2023 f/u with GI Peripheral Edema:  dependant EF normal continue lasix  Polycythemia:  repeat labs and refer to hematology   CBC Polycythemia labs including erythropoietin , JAK2 RBC count Echo with bubble study r/o shunt     F/U in a year   Signed: Janelle Mediate 02/11/2024, 10:47 AM

## 2024-02-11 ENCOUNTER — Encounter: Payer: Self-pay | Admitting: Cardiovascular Disease

## 2024-02-11 ENCOUNTER — Ambulatory Visit: Payer: Medicare Other | Attending: Cardiovascular Disease | Admitting: Cardiovascular Disease

## 2024-02-11 ENCOUNTER — Other Ambulatory Visit (HOSPITAL_COMMUNITY)
Admission: RE | Admit: 2024-02-11 | Discharge: 2024-02-11 | Disposition: A | Discharge status: Home / Self Care | Source: Ambulatory Visit | Attending: Cardiovascular Disease | Admitting: Cardiovascular Disease

## 2024-02-11 VITALS — BP 102/74 | HR 73 | Ht 63.0 in | Wt 196.0 lb

## 2024-02-11 DIAGNOSIS — R790 Abnormal level of blood mineral: Secondary | ICD-10-CM | POA: Diagnosis not present

## 2024-02-11 DIAGNOSIS — I4891 Unspecified atrial fibrillation: Secondary | ICD-10-CM | POA: Diagnosis not present

## 2024-02-11 DIAGNOSIS — Z7901 Long term (current) use of anticoagulants: Secondary | ICD-10-CM

## 2024-02-11 DIAGNOSIS — D751 Secondary polycythemia: Secondary | ICD-10-CM | POA: Insufficient documentation

## 2024-02-11 DIAGNOSIS — Z5181 Encounter for therapeutic drug level monitoring: Secondary | ICD-10-CM | POA: Diagnosis not present

## 2024-02-11 LAB — CBC
HCT: 55.9 % — ABNORMAL HIGH (ref 36.0–46.0)
Hemoglobin: 18.4 g/dL — ABNORMAL HIGH (ref 12.0–15.0)
MCH: 28.6 pg (ref 26.0–34.0)
MCHC: 32.9 g/dL (ref 30.0–36.0)
MCV: 86.8 fL (ref 80.0–100.0)
Platelets: 290 10*3/uL (ref 150–400)
RBC: 6.44 MIL/uL — ABNORMAL HIGH (ref 3.87–5.11)
RDW: 20.3 % — ABNORMAL HIGH (ref 11.5–15.5)
WBC: 7.5 10*3/uL (ref 4.0–10.5)
nRBC: 0 % (ref 0.0–0.2)

## 2024-02-11 LAB — FERRITIN: Ferritin: 12 ng/mL (ref 11–307)

## 2024-02-11 NOTE — Patient Instructions (Signed)
 Medication Instructions:  Your physician recommends that you continue on your current medications as directed. Please refer to the Current Medication list given to you today.  *If you need a refill on your cardiac medications before your next appointment, please call your pharmacy*  Lab Work: Your physician recommends that you return for lab work in: Today   If you have labs (blood work) drawn today and your tests are completely normal, you will receive your results only by: MyChart Message (if you have MyChart) OR A paper copy in the mail If you have any lab test that is abnormal or we need to change your treatment, we will call you to review the results.  Testing/Procedures: Your physician has requested that you have an echocardiogram. Echocardiography is a painless test that uses sound waves to create images of your heart. It provides your doctor with information about the size and shape of your heart and how well your heart's chambers and valves are working. This procedure takes approximately one hour. There are no restrictions for this procedure. Please do NOT wear cologne, perfume, aftershave, or lotions (deodorant is allowed). Please arrive 15 minutes prior to your appointment time.  Please note: We ask at that you not bring children with you during ultrasound (echo/ vascular) testing. Due to room size and safety concerns, children are not allowed in the ultrasound rooms during exams. Our front office staff cannot provide observation of children in our lobby area while testing is being conducted. An adult accompanying a patient to their appointment will only be allowed in the ultrasound room at the discretion of the ultrasound technician under special circumstances. We apologize for any inconvenience.   Follow-Up: At [facility], you and your health needs are our priority.  As part of our continuing mission to provide you with exceptional heart care, our providers are all part  of one team.  This team includes your primary Cardiologist (physician) and Advanced Practice Providers or APPs (Physician Assistants and Nurse Practitioners) who all work together to provide you with the care you need, when you need it.  Your next appointment:   1 year(s)  Provider:   You may see Janelle Mediate, MD or one of the following Advanced Practice Providers on your designated Care Team:   Woodfin Hays, PA-C  [place], [place] Theotis Flake, [place]     We recommend signing up for the patient portal called "MyChart".  Sign up information is provided on this After Visit Summary.  MyChart is used to connect with patients for Virtual Visits (Telemedicine).  Patients are able to view lab/test results, encounter notes, upcoming appointments, etc.  Non-urgent messages can be sent to your provider as well.   To learn more about what you can do with MyChart, go to [email or web address].   Other Instructions Thank you for choosing [facility]!

## 2024-02-12 LAB — ERYTHROPOIETIN: Erythropoietin: 1.2 m[IU]/mL — ABNORMAL LOW (ref 2.6–18.5)

## 2024-02-15 LAB — JAK2 GENOTYPR

## 2024-02-21 ENCOUNTER — Encounter: Payer: Self-pay | Admitting: Cardiovascular Disease

## 2024-02-26 ENCOUNTER — Encounter: Payer: Self-pay | Admitting: Cardiovascular Disease

## 2024-02-29 ENCOUNTER — Ambulatory Visit (HOSPITAL_COMMUNITY)
Admission: RE | Admit: 2024-02-29 | Discharge: 2024-02-29 | Disposition: A | Discharge status: Home / Self Care | Source: Ambulatory Visit | Attending: Cardiovascular Disease | Admitting: Cardiovascular Disease

## 2024-02-29 DIAGNOSIS — I4891 Unspecified atrial fibrillation: Secondary | ICD-10-CM | POA: Insufficient documentation

## 2024-02-29 DIAGNOSIS — D751 Secondary polycythemia: Secondary | ICD-10-CM | POA: Diagnosis present

## 2024-02-29 DIAGNOSIS — Z5181 Encounter for therapeutic drug level monitoring: Secondary | ICD-10-CM | POA: Insufficient documentation

## 2024-02-29 DIAGNOSIS — Q2112 Patent foramen ovale: Secondary | ICD-10-CM | POA: Diagnosis not present

## 2024-02-29 DIAGNOSIS — Z7901 Long term (current) use of anticoagulants: Secondary | ICD-10-CM | POA: Insufficient documentation

## 2024-02-29 NOTE — Progress Notes (Signed)
  Echocardiogram 2D Echocardiogram has been performed.  Royden Corin 02/29/2024, 11:28 AM

## 2024-03-05 ENCOUNTER — Encounter: Payer: Self-pay | Admitting: Cardiovascular Disease

## 2024-03-06 ENCOUNTER — Ambulatory Visit: Payer: Self-pay | Admitting: Cardiovascular Disease

## 2024-03-06 LAB — ECHOCARDIOGRAM COMPLETE BUBBLE STUDY
Area-P 1/2: 3.65 cm2
Calc EF: 53.1 %
Est EF: 55
S' Lateral: 2.9 cm
Single Plane A2C EF: 52.7 %
Single Plane A4C EF: 52.1 %

## 2024-06-08 ENCOUNTER — Other Ambulatory Visit: Payer: Self-pay | Admitting: Cardiovascular Disease

## 2024-06-08 DIAGNOSIS — I4891 Unspecified atrial fibrillation: Secondary | ICD-10-CM

## 2024-06-09 NOTE — Telephone Encounter (Signed)
 Prescription refill request for Xarelto  received.  Indication:afib Last office visit:4/25 Weight:88.9  kg Age:73 Scr:0.77  7/25 CrCl:89.96  ml/min  Prescription refilled

## 2024-09-22 ENCOUNTER — Other Ambulatory Visit (HOSPITAL_COMMUNITY): Payer: Self-pay | Admitting: Internal Medicine

## 2024-09-22 DIAGNOSIS — Z1231 Encounter for screening mammogram for malignant neoplasm of breast: Secondary | ICD-10-CM

## 2024-10-22 ENCOUNTER — Ambulatory Visit (HOSPITAL_COMMUNITY)
Admission: RE | Admit: 2024-10-22 | Discharge: 2024-10-22 | Disposition: A | Discharge status: Home / Self Care | Source: Ambulatory Visit | Attending: Internal Medicine | Admitting: Internal Medicine

## 2024-10-22 ENCOUNTER — Encounter (HOSPITAL_COMMUNITY)

## 2024-10-22 DIAGNOSIS — Z1231 Encounter for screening mammogram for malignant neoplasm of breast: Secondary | ICD-10-CM | POA: Diagnosis present

## 2025-02-06 ENCOUNTER — Ambulatory Visit: Admitting: Cardiovascular Disease
